# Patient Record
Sex: Male | Born: 1950 | Race: White | Hispanic: No | Smoking: Current some day smoker
Health system: Southern US, Community
[De-identification: ages and names within clinical notes are randomized; demographics above are authoritative.]

## PROBLEM LIST (undated history)

## (undated) DIAGNOSIS — K7460 Unspecified cirrhosis of liver: Secondary | ICD-10-CM

## (undated) DIAGNOSIS — J449 Chronic obstructive pulmonary disease, unspecified: Secondary | ICD-10-CM

## (undated) HISTORY — PX: NO PAST SURGERIES: SHX2092

## (undated) HISTORY — PX: MOUTH SURGERY: SHX715

---

## 2017-02-16 DIAGNOSIS — J449 Chronic obstructive pulmonary disease, unspecified: Secondary | ICD-10-CM | POA: Diagnosis present

## 2017-02-16 DIAGNOSIS — F1010 Alcohol abuse, uncomplicated: Secondary | ICD-10-CM | POA: Insufficient documentation

## 2017-02-16 DIAGNOSIS — R531 Weakness: Secondary | ICD-10-CM

## 2021-07-23 ENCOUNTER — Emergency Department: Payer: Medicare (Managed Care)

## 2021-07-23 ENCOUNTER — Inpatient Hospital Stay
Admission: EM | Admit: 2021-07-23 | Discharge: 2021-07-24 | DRG: 177 | Discharge status: Against Medical Advice | Payer: Medicare (Managed Care) | Attending: Internal Medicine | Admitting: Internal Medicine

## 2021-07-23 ENCOUNTER — Other Ambulatory Visit: Payer: Self-pay

## 2021-07-23 ENCOUNTER — Inpatient Hospital Stay: Payer: Self-pay

## 2021-07-23 ENCOUNTER — Encounter: Payer: Self-pay | Admitting: Emergency Medicine

## 2021-07-23 DIAGNOSIS — J1282 Pneumonia due to coronavirus disease 2019: Secondary | ICD-10-CM | POA: Diagnosis present

## 2021-07-23 DIAGNOSIS — I10 Essential (primary) hypertension: Secondary | ICD-10-CM | POA: Diagnosis present

## 2021-07-23 DIAGNOSIS — L03115 Cellulitis of right lower limb: Secondary | ICD-10-CM | POA: Diagnosis present

## 2021-07-23 DIAGNOSIS — Z5900 Homelessness unspecified: Secondary | ICD-10-CM | POA: Diagnosis not present

## 2021-07-23 DIAGNOSIS — D509 Iron deficiency anemia, unspecified: Secondary | ICD-10-CM | POA: Diagnosis not present

## 2021-07-23 DIAGNOSIS — J90 Pleural effusion, not elsewhere classified: Secondary | ICD-10-CM | POA: Diagnosis present

## 2021-07-23 DIAGNOSIS — I8510 Secondary esophageal varices without bleeding: Secondary | ICD-10-CM | POA: Diagnosis present

## 2021-07-23 DIAGNOSIS — R188 Other ascites: Secondary | ICD-10-CM | POA: Diagnosis not present

## 2021-07-23 DIAGNOSIS — R0609 Other forms of dyspnea: Secondary | ICD-10-CM | POA: Diagnosis not present

## 2021-07-23 DIAGNOSIS — K922 Gastrointestinal hemorrhage, unspecified: Secondary | ICD-10-CM | POA: Diagnosis present

## 2021-07-23 DIAGNOSIS — D649 Anemia, unspecified: Secondary | ICD-10-CM | POA: Diagnosis not present

## 2021-07-23 DIAGNOSIS — U071 COVID-19: Secondary | ICD-10-CM | POA: Diagnosis present

## 2021-07-23 DIAGNOSIS — E876 Hypokalemia: Secondary | ICD-10-CM | POA: Diagnosis present

## 2021-07-23 DIAGNOSIS — K7460 Unspecified cirrhosis of liver: Secondary | ICD-10-CM | POA: Diagnosis not present

## 2021-07-23 DIAGNOSIS — R600 Localized edema: Secondary | ICD-10-CM | POA: Diagnosis present

## 2021-07-23 DIAGNOSIS — K766 Portal hypertension: Secondary | ICD-10-CM | POA: Diagnosis present

## 2021-07-23 DIAGNOSIS — F17210 Nicotine dependence, cigarettes, uncomplicated: Secondary | ICD-10-CM | POA: Diagnosis present

## 2021-07-23 DIAGNOSIS — R0602 Shortness of breath: Secondary | ICD-10-CM | POA: Diagnosis present

## 2021-07-23 DIAGNOSIS — K7031 Alcoholic cirrhosis of liver with ascites: Secondary | ICD-10-CM | POA: Diagnosis present

## 2021-07-23 HISTORY — DX: Chronic obstructive pulmonary disease, unspecified: J44.9

## 2021-07-23 HISTORY — DX: Unspecified cirrhosis of liver: K74.60

## 2021-07-23 LAB — HEPATIC FUNCTION PANEL
ALT: 21 U/L (ref 0–44)
AST: 50 U/L — ABNORMAL HIGH (ref 15–41)
Albumin: 2.5 g/dL — ABNORMAL LOW (ref 3.5–5.0)
Alkaline Phosphatase: 122 U/L (ref 38–126)
Bilirubin, Direct: 1.6 mg/dL — ABNORMAL HIGH (ref 0.0–0.2)
Indirect Bilirubin: 3.4 mg/dL — ABNORMAL HIGH (ref 0.3–0.9)
Total Bilirubin: 5 mg/dL — ABNORMAL HIGH (ref 0.3–1.2)
Total Protein: 5.2 g/dL — ABNORMAL LOW (ref 6.5–8.1)

## 2021-07-23 LAB — BASIC METABOLIC PANEL
Anion gap: 8 (ref 5–15)
BUN: 12 mg/dL (ref 8–23)
CO2: 21 mmol/L — ABNORMAL LOW (ref 22–32)
Calcium: 8.1 mg/dL — ABNORMAL LOW (ref 8.9–10.3)
Chloride: 107 mmol/L (ref 98–111)
Creatinine, Ser: 1 mg/dL (ref 0.61–1.24)
GFR, Estimated: >60 mL/min (ref >60)
Glucose, Bld: 100 mg/dL — ABNORMAL HIGH (ref 70–99)
Potassium: 3.1 mmol/L — ABNORMAL LOW (ref 3.5–5.1)
Sodium: 136 mmol/L (ref 135–145)

## 2021-07-23 LAB — RESP PANEL BY RT-PCR (FLU A&B, COVID) ARPGX2
Influenza A by PCR: NEGATIVE
Influenza B by PCR: NEGATIVE
SARS Coronavirus 2 by RT PCR: POSITIVE — AB

## 2021-07-23 LAB — CBC
HCT: 22.1 % — ABNORMAL LOW (ref 39.0–52.0)
Hemoglobin: 6.9 g/dL — ABNORMAL LOW (ref 13.0–17.0)
MCH: 26.7 pg (ref 26.0–34.0)
MCHC: 31.2 g/dL (ref 30.0–36.0)
MCV: 85.7 fL (ref 80.0–100.0)
Platelets: 44 10*3/uL — ABNORMAL LOW (ref 150–400)
RBC: 2.58 MIL/uL — ABNORMAL LOW (ref 4.22–5.81)
RDW: 24.9 % — ABNORMAL HIGH (ref 11.5–15.5)
WBC: 2.3 10*3/uL — ABNORMAL LOW (ref 4.0–10.5)
nRBC: 0 % (ref 0.0–0.2)

## 2021-07-23 LAB — IRON AND TIBC
Iron: 30 ug/dL — ABNORMAL LOW (ref 45–182)
Saturation Ratios: 8 % — ABNORMAL LOW (ref 17.9–39.5)
TIBC: 360 ug/dL (ref 250–450)
UIBC: 330 ug/dL

## 2021-07-23 LAB — RETICULOCYTES
Immature Retic Fract: 26.4 % — ABNORMAL HIGH (ref 2.3–15.9)
RBC.: 2.54 MIL/uL — ABNORMAL LOW (ref 4.22–5.81)
Retic Count, Absolute: 73.2 10*3/uL (ref 19.0–186.0)
Retic Ct Pct: 2.9 % (ref 0.4–3.1)

## 2021-07-23 LAB — PREPARE RBC (CROSSMATCH)

## 2021-07-23 LAB — ALBUMIN: Albumin: 2.5 g/dL — ABNORMAL LOW (ref 3.5–5.0)

## 2021-07-23 LAB — ABO/RH: ABO/RH(D): O POS

## 2021-07-23 LAB — BRAIN NATRIURETIC PEPTIDE: B Natriuretic Peptide: 278.9 pg/mL — ABNORMAL HIGH (ref 0.0–100.0)

## 2021-07-23 LAB — FERRITIN: Ferritin: 29 ng/mL (ref 24–336)

## 2021-07-23 MED ORDER — SODIUM CHLORIDE 0.9 % IV SOLN
50.0000 ug/h | INTRAVENOUS | Status: DC
  Administered 2021-07-23 – 2021-07-24 (×2): 50 ug/h via INTRAVENOUS
  Filled 2021-07-23 (×5): qty 1

## 2021-07-23 MED ORDER — IOHEXOL 300 MG/ML  SOLN
75.0000 mL | Freq: Once | INTRAMUSCULAR | Status: AC | PRN
  Administered 2021-07-23: 75 mL via INTRAVENOUS

## 2021-07-23 MED ORDER — FAMOTIDINE IN NACL 20-0.9 MG/50ML-% IV SOLN
20.0000 mg | Freq: Once | INTRAVENOUS | Status: AC
  Administered 2021-07-23: 20 mg via INTRAVENOUS
  Filled 2021-07-23: qty 50

## 2021-07-23 MED ORDER — SODIUM CHLORIDE 0.9 % IV SOLN
100.0000 mg | Freq: Every day | INTRAVENOUS | Status: DC
Start: 2021-07-24 — End: 2021-07-24
  Administered 2021-07-24: 100 mg via INTRAVENOUS
  Filled 2021-07-23 (×2): qty 20

## 2021-07-23 MED ORDER — MORPHINE SULFATE (PF) 2 MG/ML IV SOLN: 2.0000 mg | INTRAVENOUS | Status: DC | PRN

## 2021-07-23 MED ORDER — SODIUM CHLORIDE 0.9 % IV SOLN
200.0000 mg | Freq: Once | INTRAVENOUS | Status: AC
  Administered 2021-07-23: 200 mg via INTRAVENOUS
  Filled 2021-07-23: qty 200

## 2021-07-23 MED ORDER — SODIUM CHLORIDE 0.9 % IV SOLN
1.0000 g | INTRAVENOUS | Status: DC
  Administered 2021-07-23: 1 g via INTRAVENOUS
  Filled 2021-07-23: qty 10

## 2021-07-23 MED ORDER — ACETAMINOPHEN 325 MG PO TABS: 650.0000 mg | ORAL_TABLET | Freq: Four times a day (QID) | ORAL | Status: DC | PRN

## 2021-07-23 MED ORDER — SODIUM CHLORIDE 0.9 % IV BOLUS
500.0000 mL | Freq: Once | INTRAVENOUS | Status: AC
Start: 2021-07-23 — End: 2021-07-23
  Administered 2021-07-23: 500 mL via INTRAVENOUS

## 2021-07-23 MED ORDER — POLYETHYLENE GLYCOL 3350 17 G PO PACK: 17.0000 g | PACK | Freq: Every day | ORAL | Status: DC | PRN

## 2021-07-23 MED ORDER — SODIUM CHLORIDE 0.9% IV SOLUTION
Freq: Once | INTRAVENOUS | Status: AC
Start: 2021-07-23 — End: 2021-07-24
  Filled 2021-07-23: qty 250

## 2021-07-23 MED ORDER — SODIUM CHLORIDE 0.9 % IV SOLN
10.0000 mL/h | Freq: Once | INTRAVENOUS | Status: AC
  Administered 2021-07-23: 10 mL/h via INTRAVENOUS

## 2021-07-23 MED ORDER — ONDANSETRON HCL 4 MG PO TABS: 4.0000 mg | ORAL_TABLET | Freq: Four times a day (QID) | ORAL | Status: DC | PRN

## 2021-07-23 MED ORDER — POTASSIUM CHLORIDE 10 MEQ/100ML IV SOLN: 10.0000 meq | INTRAVENOUS | Status: DC

## 2021-07-23 MED ORDER — PANTOPRAZOLE 80MG IVPB - SIMPLE MED
80.0000 mg | Freq: Once | INTRAVENOUS | Status: AC
  Administered 2021-07-23: 80 mg via INTRAVENOUS
  Filled 2021-07-23: qty 80

## 2021-07-23 MED ORDER — ACETAMINOPHEN 650 MG RE SUPP: 650.0000 mg | Freq: Four times a day (QID) | RECTAL | Status: DC | PRN

## 2021-07-23 MED ORDER — PANTOPRAZOLE SODIUM 40 MG IV SOLR: 40.0000 mg | Freq: Two times a day (BID) | INTRAVENOUS | Status: DC

## 2021-07-23 MED ORDER — POTASSIUM CHLORIDE CRYS ER 20 MEQ PO TBCR
40.0000 meq | EXTENDED_RELEASE_TABLET | Freq: Two times a day (BID) | ORAL | Status: AC
  Administered 2021-07-23 – 2021-07-24 (×2): 40 meq via ORAL
  Filled 2021-07-23 (×2): qty 2

## 2021-07-23 MED ORDER — PANTOPRAZOLE INFUSION (NEW) - SIMPLE MED
8.0000 mg/h | INTRAVENOUS | Status: DC
  Administered 2021-07-23 – 2021-07-24 (×2): 8 mg/h via INTRAVENOUS
  Filled 2021-07-23 (×2): qty 80

## 2021-07-23 MED ORDER — ONDANSETRON HCL 4 MG/2ML IJ SOLN: 4.0000 mg | Freq: Four times a day (QID) | INTRAMUSCULAR | Status: DC | PRN

## 2021-07-23 MED ORDER — OXYCODONE HCL 5 MG PO TABS: 5.0000 mg | ORAL_TABLET | ORAL | Status: DC | PRN

## 2021-07-23 MED ORDER — OCTREOTIDE LOAD VIA INFUSION
50.0000 ug | Freq: Once | INTRAVENOUS | Status: AC
  Administered 2021-07-23: 50 ug via INTRAVENOUS
  Filled 2021-07-23: qty 25

## 2021-07-23 NOTE — ED Triage Notes (Signed)
Pt called from [facility] to treatment room, no response. No phone number listed for pt in system

## 2021-07-23 NOTE — ED Provider Notes (Signed)
[facility] Emergency Department Provider Note ____________________________________________   Event Date/Time   First MD Initiated Contact with Patient 07/23/21 1237     (approximate)  I have reviewed the triage vital signs and the nursing notes.   HISTORY  Chief Complaint Shortness of Breath  HPI Johnny Dunlap is a 71 y.o. male with history of COPD presents to the emergency department for treatment and evaluation of shortness of breath, bilateral lower extremity edema.  Symptoms have been present for the past several weeks.  Patient states that he has been traveling on the train off and on for the past 3 months or so and attempt to find some family in different parts of the state.  Patient also complains of feeling weak and dizzy.      Past Medical History:  Diagnosis Date   Cirrhosis (HCC)    COPD (chronic obstructive pulmonary disease) (HCC)     Patient Active Problem List   Diagnosis Date Noted   GI bleed 07/23/2021    Past Surgical History:  Procedure Laterality Date   MOUTH SURGERY      Prior to Admission medications   Not on File    Allergies Patient has no known allergies.  History reviewed. No pertinent family history.  Social History Social History   Tobacco Use   Smoking status: Every Day    Types: Cigarettes   Smokeless tobacco: Never  Substance Use Topics   Alcohol use: Not Currently    Review of Systems  Constitutional: No fever/chills Eyes: No visual changes. ENT: No sore throat. Cardiovascular: Denies chest pain. Respiratory: Positive for shortness of breath. Gastrointestinal: No abdominal pain.  No nausea, no vomiting.  No diarrhea.  No constipation. Genitourinary: Negative for dysuria.  Positive for dark urine Musculoskeletal: Positive for diffuse body aches Skin: Negative for rash. Neurological: Negative for headaches, focal weakness or numbness.  ____________________________________________   PHYSICAL  EXAM:  VITAL SIGNS: ED Triage Vitals  Enc Vitals Group     BP 07/23/21 0954 95/61     Pulse Rate 07/23/21 0954 84     Resp 07/23/21 0954 (!) 22     Temp 07/23/21 0954 99.2 F (37.3 C)     Temp Source 07/23/21 0954 Oral     SpO2 07/23/21 0954 98 %     Weight 07/23/21 0947 145 lb (65.8 kg)     Height 07/23/21 0947 6' (1.829 m)     Head Circumference --      Peak Flow --      Pain Score 07/23/21 0947 8     Pain Loc --      Pain Edu? --      Excl. in GC? --     Constitutional: Alert and oriented.  Chronically ill appearing and in no acute distress. Eyes: Conjunctivae are normal.  Head: Atraumatic. Nose: No congestion/rhinnorhea. Mouth/Throat: Mucous membranes are moist.  Oropharynx non-erythematous. Neck: No stridor.   Hematological/Lymphatic/Immunilogical: No cervical lymphadenopathy. Cardiovascular: Normal rate, regular rhythm. Grossly normal heart sounds.  Good peripheral circulation.  Bilateral 2+ pitting edema lower extremities Respiratory: Normal respiratory effort.  No retractions. Lungs CTAB. Gastrointestinal: Soft and nontender. No distention. No abdominal bruits. Genitourinary:  Musculoskeletal: Positive for lower extremity tenderness and pitting edema.  Neurologic:  Normal speech and language. No gross focal neurologic deficits are appreciated. No gait instability. Skin: Weeping bilateral lower extremities without indication of cellulitis Psychiatric: Mood and affect are normal.  Tearful.  Speech and behavior are normal.  ____________________________________________  LABS (all labs ordered are listed, but only abnormal results are displayed)  Labs Reviewed  RESP PANEL BY RT-PCR (FLU A&B, COVID) ARPGX2 - Abnormal; Notable for the following components:      Result Value   SARS Coronavirus 2 by RT PCR POSITIVE (*)    All other components within normal limits  BASIC METABOLIC PANEL - Abnormal; Notable for the following components:   Potassium 3.1 (*)    CO2 21 (*)     Glucose, Bld 100 (*)    Calcium 8.1 (*)    All other components within normal limits  CBC - Abnormal; Notable for the following components:   WBC 2.3 (*)    RBC 2.58 (*)    Hemoglobin 6.9 (*)    HCT 22.1 (*)    RDW 24.9 (*)    Platelets 44 (*)    All other components within normal limits  BRAIN NATRIURETIC PEPTIDE - Abnormal; Notable for the following components:   B Natriuretic Peptide 278.9 (*)    All other components within normal limits  RETICULOCYTES - Abnormal; Notable for the following components:   RBC. 2.54 (*)    Immature Retic Fract 26.4 (*)    All other components within normal limits  ALBUMIN - Abnormal; Notable for the following components:   Albumin 2.5 (*)    All other components within normal limits  FERRITIN  HEPATIC FUNCTION PANEL  IRON AND TIBC  PATHOLOGIST SMEAR REVIEW  COMPREHENSIVE METABOLIC PANEL  CBC  PROTIME-INR  APTT  HIV ANTIBODY (ROUTINE TESTING W REFLEX)  TYPE AND SCREEN  PREPARE RBC (CROSSMATCH)  ABO/RH   ____________________________________________  EKG  ED ECG REPORT I, Lasundra Hascall, FNP-BC personally viewed and interpreted this ECG.   Date: 07/23/2021  EKG Time: 0951  Rate: 85  Rhythm: atrial fibrillation, rate controlled  Axis: normal  Intervals:none  ST&T Change: no ST elevation No previous available for comparison.  ____________________________________________  RADIOLOGY  ED MD interpretation:    Chest xray shows rounded opacity along the right inferior paramediastinal border. Radiology recommends follow up with CT.  I, Kem Boroughs, personally viewed and evaluated these images (plain radiographs) as part of my medical decision making, as well as reviewing the written report by the radiologist.  Official radiology report(s): DG Chest 2 View  Result Date: 07/23/2021 CLINICAL DATA:  shortness of breath EXAM: CHEST - 2 VIEW COMPARISON:  None. FINDINGS: The cardiomediastinal silhouette is mildly enlarged in contour.  Small RIGHT pleural effusion. No pneumothorax. Flattening of the diaphragms and diffuse reticular opacities are consistent with history of underlying emphysema. Rounded opacity along the RIGHT paramediastinal border inferiorly. Visualized abdomen is unremarkable. No acute osseous abnormality. IMPRESSION: 1. Rounded opacity along the RIGHT inferior paramediastinal border is nonspecific. Given underlying emphysema, recommend further evaluation with dedicated chest CT. Electronically Signed   By: Meda Klinefelter MD   On: 07/23/2021 14:06   CT Chest W Contrast  Result Date: 07/23/2021 CLINICAL DATA:  Abnormal xray - lung opacity/opacities EXAM: CT CHEST WITH CONTRAST TECHNIQUE: Multidetector CT imaging of the chest was performed during intravenous contrast administration. CONTRAST:  48mL OMNIPAQUE IOHEXOL 300 MG/ML  SOLN COMPARISON:  Same day radiograph FINDINGS: Cardiovascular: Heart is mildly enlarged. Three-vessel coronary artery atherosclerotic calcifications. No significant pericardial effusion. Dense aortic valve calcifications. LEFT vertebral artery arises from the aortic arch. Atherosclerotic calcifications of the aorta. Mediastinum/Nodes: Thyroid is unremarkable. No axillary adenopathy. Mildly prominent mediastinal lymph nodes with representative pretracheal lymph node measuring 9 mm in the short  axis (series 2, image 68). Lungs/Pleura: Moderate centrilobular and paraseptal emphysema. Biapical irregular opacities most consistent with scar. There is a moderate RIGHT pleural effusion. There is a small LEFT pleural effusion. Bibasilar enhancing platelike opacities. There is a suggestion of a more focal irregular nodule which measures 7 x 7 mm (series 2, image 71). Upper Abdomen: Nodular contour of the liver. Recanalized paraumbilical vein. Splenomegaly. Esophageal varices. Moderate hiatal hernia. Small volume ascites. Nonobstructive RIGHT-sided nephrolithiasis. Musculoskeletal: No fracture is seen.  IMPRESSION: 1. Moderate RIGHT and small LEFT pleural effusion with likely bibasilar atelectasis. Given underlying emphysema, recommend follow-up CT in 3-6 months to assess for resolution of more focal possible nodular opacities. 2. Given emphysema, recommend evaluation for candidacy for annual lung cancer screening. 3. Cirrhosis with sequela of portal venous hypertension including recanalization of the umbilical vein, splenomegaly, ascites and esophageal varices. 4. Questioned nodular opacity corresponds to summation of a distended inferior cavoatrial junction, hiatal hernia, esophageal varices and pleural effusion. Aortic Atherosclerosis (ICD10-I70.0) and Emphysema (ICD10-J43.9). Electronically Signed   By: Meda KlinefelterStephanie  Peacock MD   On: 07/23/2021 16:38   US EKG SITE RITE  Result Date: 07/23/2021 If Site Rite image not attached, placement could not be confirmed due to current cardiac rhythm.   ____________________________________________   PROCEDURES  Procedure(s) performed (including Critical Care):  Procedures  ____________________________________________   INITIAL IMPRESSION / ASSESSMENT AND PLAN     71 year old male presenting to the emergency department for treatment and evaluation of symptoms as described in the HPI.  CBC shows pancytopenia with a hemoglobin of 6.9 hematocrit of 22.1 and platelet count of 44.  There are no outside records available for comparison of labs, imaging, or EKG.  EKG does show controlled A. fib.  DIFFERENTIAL DIAGNOSIS  Symptomatic anemia, acute on chronic anemia, COPD exacerbation, CHF, COVID-19  ED COURSE  Hemoccult is positive.  Anemia discussed with the patient who is lying in the bed covered with 2 blankets and a heavy winter coat.  He states that he cannot seem to get warm.  He states that this has been progressively worsening over the past couple of weeks.  He has not noticed any dark or tarry stools but states that he has not been paying much  attention as he has been traveling significant number of days.  Discussed blood transfusion.  Patient is agreeable and will consent.  He also agrees to admission.  Awaiting CT of the chest to determine if antibiotics are indicated for the opacity noted in the right lung.  No indication of sepsis at this time.  CT of the chest without concern of infectious process. Patient is COVID positive. Blood infusing at this time. Will request Hospitalist consult for admission.  CRITICAL CARE Performed by: Kem Boroughsari Nargis Abrams   Total critical care time: 45 minutes  Critical care time was exclusive of separately billable procedures and treating other patients.  Critical care was necessary to treat or prevent imminent or life-threatening deterioration.  Critical care was time spent personally by me on the following activities: development of treatment plan with patient and/or surrogate as well as nursing, discussions with consultants, evaluation of patient's response to treatment, examination of patient, obtaining history from patient or surrogate, ordering and performing treatments and interventions, ordering and review of laboratory studies, ordering and review of radiographic studies, pulse oximetry and re-evaluation of patient's condition.     Clinical Course as of 07/23/21 1956  Sun Jul 23, 2021  1736 Accepted for admission. [CT]    Clinical Course  User Index [CT] Chinita Pester, FNP   ___________________________________________   FINAL CLINICAL IMPRESSION(S) / ED DIAGNOSES  Final diagnoses:  Symptomatic anemia  Gastrointestinal hemorrhage, unspecified gastrointestinal hemorrhage type  COVID     ED Discharge Orders     None        Johnny Dunlap was evaluated in Emergency Department on 07/23/2021 for the symptoms described in the history of present illness. He was evaluated in the context of the global COVID-19 pandemic, which necessitated consideration that the patient might be at risk  for infection with the SARS-CoV-2 virus that causes COVID-19. Institutional protocols and algorithms that pertain to the evaluation of patients at risk for COVID-19 are in a state of rapid change based on information released by regulatory bodies including the CDC and federal and state organizations. These policies and algorithms were followed during the patient's care in the ED.   Note:  This document was prepared using Dragon voice recognition software and may include unintentional dictation errors.    Chinita Pester, FNP 07/23/21 1956    Gilles Chiquito, MD 07/23/21 2030

## 2021-07-23 NOTE — Progress Notes (Signed)
Pharmacy Consult - Remdesivir  71 yo male presenting COVID-19 positive with respiratory symptoms requiring hospitalization. Pharmacy consulted to dose Remdesivir. ALT<220.  Plan: Remdesivir 200mg  IV x 1; then 100mg  IV q24h to complete 5 total doses Monitor clinical progress  , PharmD Clinical Pharmacist  07/23/2021   5:48 PM

## 2021-07-23 NOTE — ED Notes (Signed)
Partial infiltration of the Remdesivir into the left upper arm, called the Pharmacy for treatment [warm compresses and elevation of extremity, notified the Hospitalist of the infiltration and treatment, also informed the Hospitalist of the delay of Octreotde due to poor vasculat access.   Patient is ordered a Picc Line, but the Picc Line can not be done until 7/25

## 2021-07-23 NOTE — ED Triage Notes (Signed)
Pt called from [facility] to treatment room, no response 

## 2021-07-23 NOTE — ED Triage Notes (Signed)
Pt [place] the ED [place] [facility] [place] [facility]/o [facility].  98% RA with good air movement per EMS.  H/o COPD, but does not wear O2 at baseline.  EKG unremarkable.  102 systolic for BP.  Pt does admit to some bilateral leg swelling.

## 2021-07-23 NOTE — H&P (Signed)
History and Physical    Johnny Dunlap GNO:[id number] DOB: Mar 29, 1950 DOA: 07/23/2021  PCP: Pcp, No  Chief Complaint: Shortness of breath  HPI: Johnny Dunlap is a 71 y.o. male with a past medical history of COPD not on oxygen supplementation, cirrhosis, minimal tobacco use approximately 3 cigarettes/day, former alcohol use.  The patient presented to the ED via EMS from the train station. The patient presented to the emergency department due to shortness of breath and bilateral lower extremity edema.  Symptoms have been going on for the past several weeks.  The patient stated that he has been traveling on the train off and on for the past 3 months or so and is attempting to find some family in different parts of the state.  He is homeless.  Associated symptoms include generalized weakness and dizziness.  He states he has not noticed any melena or hematochezia.  But states he has not been paying much attention to his stools.  Upon my evaluation he is currently getting his first unit of packed red blood cells.  Covered in blankets and a jacket and states he feels cold.  He is not shivering or having rigors.  States he has been COVID vaccinated.  Somewhat of a poor historian.  Adamantly tells me that he does not drink alcohol anymore. He denies any chest pain.  Denies any nausea, vomiting, diarrhea.  He denies any abdominal pain.  He denies any hematemesis.  No known history of CHF.  He denies any use of NSAIDs.  ED Course: Type and screen, BMP, CBC, reticulocyte, COVID-19/influenza/RSV PCR panel.  Pepcid 20 mg IV x1.  Normal saline 500 cc bolus x1.  Review of Systems: 14 point review of systems is negative except for what is mentioned above in the HPI.   Past Medical History:  Diagnosis Date   Cirrhosis (HCC)    COPD (chronic obstructive pulmonary disease) (HCC)     Past Surgical History:  Procedure Laterality Date   MOUTH SURGERY      Social History   Socioeconomic History   Marital status:  Legally Separated    Spouse name: Not on file   Number of children: Not on file   Years of education: Not on file   Highest education level: Not on file  Occupational History   Not on file  Tobacco Use   Smoking status: Every Day    Types: Cigarettes   Smokeless tobacco: Never  Substance and Sexual Activity   Alcohol use: Not Currently   Drug use: Not on file   Sexual activity: Not on file  Other Topics Concern   Not on file  Social History Narrative   Not on file   Social Determinants of Health   Financial Resource Strain: Not on file  Food Insecurity: Not on file  Transportation Needs: Not on file  Physical Activity: Not on file  Stress: Not on file  Social Connections: Not on file  Intimate Partner Violence: Not on file    No Known Allergies  History reviewed. No pertinent family history.  Prior to Admission medications   Not on File    Physical Exam: Vitals:   07/23/21 1642 07/23/21 1700 07/23/21 1800 07/23/21 1854  BP: (!) 95/55 (!) 97/54 (!) 97/54 (!) 95/59  Pulse: 64 72 60 60  Resp: (!) 21 20 18 19   Temp: 98.2 F (36.8 C)  98.1 F (36.7 C) 98.2 F (36.8 C)  TempSrc: Oral  Oral Oral  SpO2: 94% 95% 95% 95%  Weight:      Height:         General: Jaundiced appearing, alert and blankets and jacket but is disheveled Cardiovascular: Irregular rhythm with regular rate Respiratory:   CTA bilaterally with no wheezes/rales/rhonchi.  Normal respiratory effort. Abdomen:  soft, NT, ND, NABS Skin:  no rash or induration seen on limited exam Musculoskeletal:  grossly normal tone BUE/BLE, good ROM, no bony abnormality Lower extremity:  No 2+ pitting edema bilateral lower extremities with weeping to the knees.  Right anterior shin area with erythema and some warmth consistent with mild cellulitis. Limited foot exam with no ulcerations.  2+ distal pulses. Psychiatric:  grossly normal mood and affect, speech fluent and appropriate, AOx3 Neurologic:  CN 2-12 grossly  intact, moves all extremities in coordinated fashion, sensation intact    Radiological Exams on Admission: Independently reviewed - see discussion in A/P where applicable  DG Chest 2 View  Result Date: 07/23/2021 CLINICAL DATA:  shortness of breath EXAM: CHEST - 2 VIEW COMPARISON:  None. FINDINGS: The cardiomediastinal silhouette is mildly enlarged in contour. Small RIGHT pleural effusion. No pneumothorax. Flattening of the diaphragms and diffuse reticular opacities are consistent with history of underlying emphysema. Rounded opacity along the RIGHT paramediastinal border inferiorly. Visualized abdomen is unremarkable. No acute osseous abnormality. IMPRESSION: 1. Rounded opacity along the RIGHT inferior paramediastinal border is nonspecific. Given underlying emphysema, recommend further evaluation with dedicated chest CT. Electronically Signed   By: Meda Klinefelter MD   On: 07/23/2021 14:06   CT Chest W Contrast  Result Date: 07/23/2021 CLINICAL DATA:  Abnormal xray - lung opacity/opacities EXAM: CT CHEST WITH CONTRAST TECHNIQUE: Multidetector CT imaging of the chest was performed during intravenous contrast administration. CONTRAST:  31mL OMNIPAQUE IOHEXOL 300 MG/ML  SOLN COMPARISON:  Same day radiograph FINDINGS: Cardiovascular: Heart is mildly enlarged. Three-vessel coronary artery atherosclerotic calcifications. No significant pericardial effusion. Dense aortic valve calcifications. LEFT vertebral artery arises from the aortic arch. Atherosclerotic calcifications of the aorta. Mediastinum/Nodes: Thyroid is unremarkable. No axillary adenopathy. Mildly prominent mediastinal lymph nodes with representative pretracheal lymph node measuring 9 mm in the short axis (series 2, image 68). Lungs/Pleura: Moderate centrilobular and paraseptal emphysema. Biapical irregular opacities most consistent with scar. There is a moderate RIGHT pleural effusion. There is a small LEFT pleural effusion. Bibasilar  enhancing platelike opacities. There is a suggestion of a more focal irregular nodule which measures 7 x 7 mm (series 2, image 71). Upper Abdomen: Nodular contour of the liver. Recanalized paraumbilical vein. Splenomegaly. Esophageal varices. Moderate hiatal hernia. Small volume ascites. Nonobstructive RIGHT-sided nephrolithiasis. Musculoskeletal: No fracture is seen. IMPRESSION: 1. Moderate RIGHT and small LEFT pleural effusion with likely bibasilar atelectasis. Given underlying emphysema, recommend follow-up CT in 3-6 months to assess for resolution of more focal possible nodular opacities. 2. Given emphysema, recommend evaluation for candidacy for annual lung cancer screening. 3. Cirrhosis with sequela of portal venous hypertension including recanalization of the umbilical vein, splenomegaly, ascites and esophageal varices. 4. Questioned nodular opacity corresponds to summation of a distended inferior cavoatrial junction, hiatal hernia, esophageal varices and pleural effusion. Aortic Atherosclerosis (ICD10-I70.0) and Emphysema (ICD10-J43.9). Electronically Signed   By: Meda Klinefelter MD   On: 07/23/2021 16:38   [place] EKG SITE RITE  Result Date: 07/23/2021 If Site Rite image not attached, placement could not be confirmed due to current cardiac rhythm.   EKG: Independently reviewed.  Atrial fib with rate 85  Labs on Admission: I have personally reviewed the available labs and imaging  studies at the time of the admission.  Pertinent labs: BNP 278, WBC 2.3, hemoglobin 6.9, hematocrit 22.1, platelets 44, potassium 3.1, bicarb 21, blood glucose 100, calcium 8.1.  COVID-19 PCR positive.  Hemoccult positive with brown stool.     Assessment/Plan: COVID-19 infection: The patient will be admitted to the progressive cardiac floor under inpatient status with cardiac monitoring.  No infiltrate or consolidation seen on CT chest with contrast.  There is moderate right and small left pleural effusion noted.  He  is not acutely hypoxic.  Can consider thoracentesis if he becomes symptomatic.  Start remdesivir.  He states he is vaccinated.  No indication for steroids at the moment.  The patient is getting multiple IV infusions therefore we will order a PICC line.  Acute GI bleed: Blood pressures are near hypotension with systolics in the 90s.  He is acutely anemic with a hemoglobin of 6.9.  Type and screen done.  Currently getting first unit of packed red blood cells now.  I have ordered a second unit as well. Hemoccult was positive.  Brown stool was noted.  Unclear etiology.  CT imaging notes cirrhosis with sequela of portal-venous hypertension and esophageal varices.  Gastroenterology will be consulted.  He denies any hematemesis.  Start Protonix bolus and subsequently started on a Protonix drip.  Discussed with GI and they recommended starting on octreotide drip and ceftriaxone.  N.p.o. after midnight.  We will obtain iron studies.  Pancytopenia: Pathologist smear review pending.  Hypokalemia: Initial serum potassium 3.1.  Will replete with oral potassium supplementation.  Bilateral pleural effusions and bilateral peripheral edema: BNP elevated at 278.  We will obtain an echocardiogram.  We will hold off on any IV diuresis for now as he is not acutely hypoxic and blood pressures are soft.  Mild cellulitis: Nonpurulent right lower extremity cellulitis.  Started on ceftriaxone as noted above.  Tobacco use: Counseled on tobacco cessation.  COPD: Not in acute exacerbation.  Cirrhosis: Does not appear to be in acute decompensation.  Continue monitoring. Will obtain hepatitis serologies.  Level of Care: Cardiac progressive DVT prophylaxis: SCDs Code Status: Full code Consults: Gastroenterology Admission status: Inpatient   Verdia Kuba DO [facility]   How to contact the [facility] Attending or Consulting provider 7A - 7P or covering provider during after hours 7P -7A, for this patient?  Check the  care team in [facility] and look for a) attending/consulting [facility] provider listed and b) the [facility] team listed Log into [email or web address] and use [facility] universal password to access. If you do not have the password, please contact the hospital operator. Locate the [facility] provider you are looking for under [facility] and page to a number that you can be directly reached. If you still have difficulty reaching the provider, please page the [facility] (Director on Call) for the Hospitalists listed on amion for assistance.   07/23/2021, 7:42 PM

## 2021-07-23 NOTE — Progress Notes (Signed)
Secure chat sent to [id number] [facility] re PICC order.  PICC will not be placed until Monday.

## 2021-07-24 ENCOUNTER — Other Ambulatory Visit: Payer: Self-pay | Admitting: Family Medicine

## 2021-07-24 ENCOUNTER — Inpatient Hospital Stay (HOSPITAL_COMMUNITY)
Admit: 2021-07-24 | Discharge: 2021-07-24 | Disposition: A | Discharge status: Home / Self Care | Payer: Medicare (Managed Care) | Attending: Family Medicine | Admitting: Family Medicine

## 2021-07-24 DIAGNOSIS — U071 COVID-19: Secondary | ICD-10-CM | POA: Diagnosis not present

## 2021-07-24 DIAGNOSIS — K7460 Unspecified cirrhosis of liver: Secondary | ICD-10-CM | POA: Diagnosis not present

## 2021-07-24 DIAGNOSIS — R0609 Other forms of dyspnea: Secondary | ICD-10-CM | POA: Diagnosis not present

## 2021-07-24 DIAGNOSIS — D509 Iron deficiency anemia, unspecified: Secondary | ICD-10-CM | POA: Diagnosis not present

## 2021-07-24 DIAGNOSIS — D649 Anemia, unspecified: Secondary | ICD-10-CM

## 2021-07-24 DIAGNOSIS — R188 Other ascites: Secondary | ICD-10-CM

## 2021-07-24 LAB — COMPREHENSIVE METABOLIC PANEL
ALT: 24 U/L (ref 0–44)
AST: 55 U/L — ABNORMAL HIGH (ref 15–41)
Albumin: 2.2 g/dL — ABNORMAL LOW (ref 3.5–5.0)
Alkaline Phosphatase: 102 U/L (ref 38–126)
Anion gap: 7 (ref 5–15)
BUN: 10 mg/dL (ref 8–23)
CO2: 19 mmol/L — ABNORMAL LOW (ref 22–32)
Calcium: 7.7 mg/dL — ABNORMAL LOW (ref 8.9–10.3)
Chloride: 110 mmol/L (ref 98–111)
Creatinine, Ser: 0.88 mg/dL (ref 0.61–1.24)
GFR, Estimated: >60 mL/min (ref >60)
Glucose, Bld: 81 mg/dL (ref 70–99)
Potassium: 4.2 mmol/L (ref 3.5–5.1)
Sodium: 136 mmol/L (ref 135–145)
Total Bilirubin: 4.6 mg/dL — ABNORMAL HIGH (ref 0.3–1.2)
Total Protein: 5 g/dL — ABNORMAL LOW (ref 6.5–8.1)

## 2021-07-24 LAB — TYPE AND SCREEN
ABO/RH(D): O POS
Antibody Screen: NEGATIVE
Unit division: 0

## 2021-07-24 LAB — ECHOCARDIOGRAM COMPLETE
AR max vel: 2.61 cm2
AV Area VTI: 1.96 cm2
AV Area mean vel: 2.37 cm2
AV Mean grad: 4 mmHg
AV Peak grad: 7.2 mmHg
Ao pk vel: 1.34 m/s
Area-P 1/2: 2.71 cm2
Height: 72 in
MV VTI: 2.75 cm2
S' Lateral: 2.3 cm
Weight: 2320 oz

## 2021-07-24 LAB — CBC
HCT: 27.7 % — ABNORMAL LOW (ref 39.0–52.0)
Hemoglobin: 8.8 g/dL — ABNORMAL LOW (ref 13.0–17.0)
MCH: 27.8 pg (ref 26.0–34.0)
MCHC: 31.8 g/dL (ref 30.0–36.0)
MCV: 87.4 fL (ref 80.0–100.0)
Platelets: 36 10*3/uL — ABNORMAL LOW (ref 150–400)
RBC: 3.17 MIL/uL — ABNORMAL LOW (ref 4.22–5.81)
RDW: 23.5 % — ABNORMAL HIGH (ref 11.5–15.5)
WBC: 3.3 10*3/uL — ABNORMAL LOW (ref 4.0–10.5)
nRBC: 0 % (ref 0.0–0.2)

## 2021-07-24 LAB — PROTIME-INR
INR: 2 — ABNORMAL HIGH (ref 0.8–1.2)
Prothrombin Time: 22.5 seconds — ABNORMAL HIGH (ref 11.4–15.2)

## 2021-07-24 LAB — HEPATITIS PANEL, ACUTE
HCV Ab: NONREACTIVE
Hep A IgM: NONREACTIVE
Hep B C IgM: NONREACTIVE
Hepatitis B Surface Ag: NONREACTIVE

## 2021-07-24 LAB — BPAM RBC
Blood Product Expiration Date: 202208222359
ISSUE DATE / TIME: 202207241611
Unit Type and Rh: 5100

## 2021-07-24 LAB — PATHOLOGIST SMEAR REVIEW

## 2021-07-24 LAB — APTT: aPTT: 47 seconds — ABNORMAL HIGH (ref 24–36)

## 2021-07-24 LAB — HIV ANTIBODY (ROUTINE TESTING W REFLEX): HIV Screen 4th Generation wRfx: NONREACTIVE

## 2021-07-24 MED ORDER — FUROSEMIDE 40 MG PO TABS
40.0000 mg | ORAL_TABLET | Freq: Every day | ORAL | Status: DC
  Administered 2021-07-24: 40 mg via ORAL
  Filled 2021-07-24: qty 1

## 2021-07-24 MED ORDER — ALBUMIN HUMAN 25 % IV SOLN
12.5000 g | Freq: Once | INTRAVENOUS | Status: DC
  Filled 2021-07-24: qty 50

## 2021-07-24 MED ORDER — VITAMIN K1 10 MG/ML IJ SOLN
10.0000 mg | Freq: Every day | INTRAMUSCULAR | Status: DC
  Administered 2021-07-24: 10 mg via SUBCUTANEOUS
  Filled 2021-07-24: qty 1

## 2021-07-24 MED ORDER — FUROSEMIDE 40 MG PO TABS: 40.0000 mg | ORAL_TABLET | Freq: Once | ORAL | Status: DC

## 2021-07-24 NOTE — Progress Notes (Signed)
*  PRELIMINARY RESULTS* Echocardiogram 2D Echocardiogram has been performed.  Joanette Gula Alvey Brockel 07/24/2021, 9:24 AM

## 2021-07-24 NOTE — ED Notes (Signed)
Pt demanded to take all iv infusions to be disconnected. MD notified of patient wished and patient wish to leave hospital. Awaiting md direction.

## 2021-07-24 NOTE — Progress Notes (Signed)
OT Cancellation Note  Patient Details Name: Johnny Dunlap MRN: [id number] DOB: Nov 10, 1950   Cancelled Treatment:    Reason Eval/Treat Not Completed: Fatigue/lethargy limiting ability to participate  Thank you for OT consult.  OT attempted to engage pt in evaluation, but pt asleep.  Pt briefly woke with verbal cues from OT, but promptly fell back asleep and declined to respond to any prompts for participation in evaluation.  Will continue to follow up at next opportunity.  Dennison Nancy, OTR/L 07/24/21, 12:01 PM

## 2021-07-24 NOTE — ED Notes (Signed)
Lab at bedside attempting to get morning labs. PT in bed in no acute distress.

## 2021-07-24 NOTE — Progress Notes (Signed)
PROGRESS NOTE  Johnny Dunlap  DOB: October 01, 1950  PCP: Aviva Kluver LKJ:[id number]  DOA: 07/23/2021  LOS: 1 day  Hospital Day: 2   Chief Complaint  Patient presents with   Shortness of Breath    Brief narrative: Johnny Dunlap is a 71 y.o. male, previously alcoholic with PMH significant for alcoholic liver cirrhosis, COPD not on oxygen, continues to smoke around 3 cigarettes a day. 7/24, patient [place] by [place] for shortness of breath, bilateral lower extremity edema.  Symptoms progressing for last several weeks.  Patient currently is homeless and apparently had been traveling frequently on the train for the past 3 months in an attempt to find some family in different parts of the state.   In the ED, patient was afebrile, blood pressure in 90s. Labs showed WBC count at 2.3, hemoglobin low at 6.9, platelet low at 44. COVID PCR positive CT chest with emphysema, bilateral pleural effusion right more than left, liver cirrhosis with evidence of portal hypertension including splenomegaly, ascites and esophageal varices.  In the ED, patient was started on IV fluid and blood transfusion.   Admitted to hospitalist service.   Received 1 unit of PRBC overnight.     Subjective: Patient was seen and examined this am.  Lying on bed.  Not in distress.  No new symptoms.  Wants to eat.  Understands he is n.p.o. for potential EGD today.  Assessment/Plan: Possible acute GI bleeding in the setting of liver cirrhosis -Presented with shortness of breath, pedal edema, generalized weakness -No report of obvious bleeding but noted to have hemoglobin low at 6.9 -GI consulted.  Per recommendation, patient has been started on Protonix drip, octreotide drip and IV Rocephin. -Currently NPO.  Acute anemia -Presented with a low hemoglobin of 6.9 with ferritin low at 29.  May have chronic anemia because of coexisting cirrhosis but baseline hemoglobin not available -Monitor PRBC was given  overnight.  Hemoglobin improved to 8.8 this morning.  No active bleeding.  Continue to monitor Recent Labs    07/23/21 0950 07/23/21 1514 07/24/21 0721  HGB 6.9*  --  8.8*  MCV 85.7  --  87.4  FERRITIN 29  --   --   TIBC 360  --   --   IRON 30*  --   --   RETICCTPCT  --  2.9  --    Pancytopenia -Likely due to liver cirrhosis itself.  WBC count has improved a little bit but platelet count is further down this morning.  Continue to monitor.  Avoid heparin products at this time.  Thrombocytopenia. Recent Labs  Lab 07/23/21 0950 07/24/21 0721  WBC 2.3* 3.3*  HGB 6.9* 8.8*  HCT 22.1* 27.7*  MCV 85.7 87.4  PLT 44* 36*   COVID-19 infection -Shortness of breath probably not due to COVID.  No infiltrate or consolidation seen on CT chest. -On admission, patient was started on IV remdesivir, will run a short course of 3 days.  No need of steroids. -Continue monitor respiratory status.  Alcoholic liver cirrhosis Anasarca due to portal hypertension -As evidenced by bilateral pedal edema, ascites, bilateral pleural effusion, R>L. -Would benefit from diuresis but blood pressure is also running on the lower side of normal. -I would cautiously diurese this patient with Lasix 40 mg oral daily.  May not be able to tolerate standard regimen with beta-blocker or Aldactone at this time. -Continue to monitor intake and output. -Pending echocardiogram.  Hypokalemia -Potassium level improved with  replacement.  Continue to monitor with diuresis. Recent Labs  Lab 07/23/21 0950 07/24/21 0721  K 3.1* 4.2   COPD  Chronic smoker -Counseled to quit.  Nicotine patch offered. -Evidence of emphysema and CT chest.  Homeless status -Patient reports that 2 weeks ago he was hospitalized at [facility], could not specify for what reason.  Once he got out, he learned that his nephew with the power of attorney had sold his car and townhome.  Patient states he has been homeless since then. -Social work consult  placed.  Impaired mobility -PT eval ordered.   Mobility: Encourage ambulation Code Status:   Code Status: Full Code  Nutritional status: Body mass index is 19.67 kg/m.     Diet:  Diet Order             Diet NPO time specified  Diet effective now                  DVT prophylaxis:  SCDs Start: 07/23/21 1818   Antimicrobials: IV Rocephin Fluid: None Consultants: GI Family Communication: None at bedside  Status is: Inpatient  Remains inpatient appropriate because: Needs GI evaluation  Dispo: The patient is from: Homeless              Anticipated d/c is to: Pending PT eval              Patient currently is not medically stable to d/c.   Difficult to place patient No     Infusions:   cefTRIAXone (ROCEPHIN)  IV Stopped (07/23/21 2029)   octreotide  (SANDOSTATIN)    IV infusion 50 mcg/hr (07/24/21 0900)   pantoprazole 8 mg/hr (07/24/21 0901)   remdesivir 100 mg in NS 100 mL 100 mg (07/24/21 0942)    Scheduled Meds:  furosemide  40 mg Oral Daily   [START ON 07/27/2021] pantoprazole  40 mg Intravenous Q12H    Antimicrobials: Anti-infectives (From admission, onward)    Start     Dose/Rate Route Frequency Ordered Stop   07/24/21 1000  remdesivir 100 mg in sodium chloride 0.9 % 100 mL IVPB       See Hyperspace for full Linked Orders Report.   100 mg 200 mL/hr over 30 Minutes Intravenous Daily 07/23/21 1749 07/28/21 0959   07/23/21 2000  cefTRIAXone (ROCEPHIN) 1 g in sodium chloride 0.9 % 100 mL IVPB        1 g 200 mL/hr over 30 Minutes Intravenous Every 24 hours 07/23/21 1828     07/23/21 1830  remdesivir 200 mg in sodium chloride 0.9% 250 mL IVPB       See Hyperspace for full Linked Orders Report.   200 mg 580 mL/hr over 30 Minutes Intravenous Once 07/23/21 1749 07/23/21 2131       PRN meds: acetaminophen **OR** acetaminophen, morphine injection, ondansetron **OR** ondansetron (ZOFRAN) IV, oxyCODONE, polyethylene glycol   Objective: Vitals:    07/24/21 0522 07/24/21 0730  BP: 100/63 96/71  Pulse: 61 68  Resp: 18 (!) 22  Temp:    SpO2: 96% 95%    Intake/Output Summary (Last 24 hours) at 07/24/2021 0953 Last data filed at 07/23/2021 1855 Gross per 24 hour  Intake 1421.67 ml  Output --  Net 1421.67 ml   Filed Weights   07/23/21 0947  Weight: 65.8 kg   Weight change:  Body mass index is 19.67 kg/m.   Physical Exam: General exam: Pleasant, elderly Caucasian male.  Not in physical distress Skin: No rashes, lesions or  ulcers. HEENT: Atraumatic, normocephalic, no obvious bleeding Lungs: Clear to auscultation bilaterally CVS: Regular rate and rhythm, no murmur GI/Abd soft, nontender, mild distention from ascites, bowel sound present CNS: Alert, awake, oriented x3 Psychiatry: Depressed look Extremities: Pedal edema 1+ bilaterally with no evidence of cellulitis.  No calf tenderness.  Data Review: I have personally reviewed the laboratory data and studies available.  Recent Labs  Lab 07/23/21 0950 07/24/21 0721  WBC 2.3* 3.3*  HGB 6.9* 8.8*  HCT 22.1* 27.7*  MCV 85.7 87.4  PLT 44* 36*   Recent Labs  Lab 07/23/21 0950 07/24/21 0721  NA 136 136  K 3.1* 4.2  CL 107 110  CO2 21* 19*  GLUCOSE 100* 81  BUN 12 10  CREATININE 1.00 0.88  CALCIUM 8.1* 7.7*    F/u labs ordered. Unresulted Labs (From admission, onward)     Start     Ordered   07/25/21 0500  Ammonia  Tomorrow morning,   STAT        07/24/21 0816   07/25/21 0500  CBC with Differential/Platelet  Daily,   STAT      07/24/21 0816   07/25/21 0500  Comprehensive metabolic panel  Daily,   STAT      07/24/21 0816   07/25/21 0500  Magnesium  Tomorrow morning,   STAT        07/24/21 0816   07/25/21 0500  Phosphorus  Tomorrow morning,   STAT        07/24/21 0816   07/24/21 0500  HIV Antibody (routine testing w rflx)  Once,   AD        07/24/21 0500   07/24/21 0500  Hepatitis panel, acute  Tomorrow morning,   STAT        07/23/21 2105   07/23/21 1500   Pathologist smear review  Once,   AD        07/23/21 1500            Signed, Lorin Glass, MD [facility] 07/24/2021

## 2021-07-24 NOTE — Consult Note (Addendum)
Wyline Mood , MD [address], [place], [place], [address] [address], [address], [place], [place], [address] Phone: [phone]  Fax: [phone]  Consultation  Referring Provider:     Dr Pola Corn Primary Care Physician:  Pcp, No Primary Gastroenterologist: None          Reason for Consultation:     Anemia   Date of Admission:  07/23/2021 Date of Consultation:  07/24/2021         HPI:   Johnny Dunlap is a 71 y.o. male with a history of COPD, smoker, ex alcohol abuse, homeless , presented to the ER with shortness of breath on 07/23/2021. Came into the ER with shortness of greath .    In the ER found to be COVID positive. Moderate right and lft pleural effusion. Hb 6.9 grams brown stool noted in the ER . ECHO performed. Ct chest was performed and showed features of portal hypertension. INR 2.0 , repeat Hb 8.8 grams with mcv 87 , platelet count low at 36 , BNP elevated.Low iron at 29.   He absolutely denies any hematemesis, hemoptysis, nasal bleeds, blood per rectum.  Denies any use of NSAIDs.  Denies any recent use of alcohol.  When I went into the room he was profoundly coughing denies any other complaint.  Past Medical History:  Diagnosis Date   Cirrhosis (HCC)    COPD (chronic obstructive pulmonary disease) (HCC)     Past Surgical History:  Procedure Laterality Date   MOUTH SURGERY      Prior to Admission medications   Not on File    History reviewed. No pertinent family history.   Social History   Tobacco Use   Smoking status: Every Day    Types: Cigarettes   Smokeless tobacco: Never  Substance Use Topics   Alcohol use: Not Currently    Allergies as of 07/23/2021   (No Known Allergies)    Review of Systems:    All systems reviewed and negative except where noted in HPI.   Physical Exam:  Vital signs in last 24 hours: Temp:  [98.1 F (36.7 C)-99.2 F (37.3 C)] 98.2 F (36.8 C) (07/24 1854) Pulse Rate:  [60-84] 68 (07/25 0730) Resp:  [16-22] 22  (07/25 0730) BP: (94-102)/(48-71) 96/71 (07/25 0730) SpO2:  [94 %-100 %] 95 % (07/25 0730) Weight:  [65.8 kg] 65.8 kg (07/24 0947)   General:   Appears comfortable but having bouts of coughing Head:  Normocephalic and atraumatic. Eyes:   No icterus.   Conjunctiva pink. PERRLA. Ears:  Normal auditory acuity. Lungs: Decreased air entry bilaterally equal, a lot of coughing during examination Heart:  Regular rate and rhythm;  Without murmur, clicks, rubs or gallops Abdomen:  Soft, nondistended, nontender. Normal bowel sounds. No appreciable masses or hepatomegaly.  No rebound or guarding.  Neurologic:  Alert and oriented x3;  grossly normal neurologically. Skin:  Intact without significant lesions or rashes. Cervical Nodes:  No significant cervical adenopathy. Psych:  Alert and cooperative. Normal affect.  LAB RESULTS: Recent Labs    07/23/21 0950 07/24/21 0721  WBC 2.3* 3.3*  HGB 6.9* 8.8*  HCT 22.1* 27.7*  PLT 44* 36*   BMET Recent Labs    07/23/21 0950 07/24/21 0721  NA 136 136  K 3.1* 4.2  CL 107 110  CO2 21* 19*  GLUCOSE 100* 81  BUN 12 10  CREATININE 1.00 0.88  CALCIUM 8.1* 7.7*   LFT Recent Labs  07/23/21 0950 07/24/21 0721  PROT 5.2* 5.0*  ALBUMIN 2.5*  2.5* 2.2*  AST 50* 55*  ALT 21 24  ALKPHOS 122 102  BILITOT 5.0* 4.6*  BILIDIR 1.6*  --   IBILI 3.4*  --    PT/INR Recent Labs    07/24/21 0721  LABPROT 22.5*  INR 2.0*    STUDIES: DG Chest 2 View  Result Date: 07/23/2021 CLINICAL DATA:  shortness of breath EXAM: CHEST - 2 VIEW COMPARISON:  None. FINDINGS: The cardiomediastinal silhouette is mildly enlarged in contour. Small RIGHT pleural effusion. No pneumothorax. Flattening of the diaphragms and diffuse reticular opacities are consistent with history of underlying emphysema. Rounded opacity along the RIGHT paramediastinal border inferiorly. Visualized abdomen is unremarkable. No acute osseous abnormality. IMPRESSION: 1. Rounded opacity along  the RIGHT inferior paramediastinal border is nonspecific. Given underlying emphysema, recommend further evaluation with dedicated chest CT. Electronically Signed   By: Meda Klinefelter MD   On: 07/23/2021 14:06   CT Chest W Contrast  Result Date: 07/23/2021 CLINICAL DATA:  Abnormal xray - lung opacity/opacities EXAM: CT CHEST WITH CONTRAST TECHNIQUE: Multidetector CT imaging of the chest was performed during intravenous contrast administration. CONTRAST:  42mL OMNIPAQUE IOHEXOL 300 MG/ML  SOLN COMPARISON:  Same day radiograph FINDINGS: Cardiovascular: Heart is mildly enlarged. Three-vessel coronary artery atherosclerotic calcifications. No significant pericardial effusion. Dense aortic valve calcifications. LEFT vertebral artery arises from the aortic arch. Atherosclerotic calcifications of the aorta. Mediastinum/Nodes: Thyroid is unremarkable. No axillary adenopathy. Mildly prominent mediastinal lymph nodes with representative pretracheal lymph node measuring 9 mm in the short axis (series 2, image 68). Lungs/Pleura: Moderate centrilobular and paraseptal emphysema. Biapical irregular opacities most consistent with scar. There is a moderate RIGHT pleural effusion. There is a small LEFT pleural effusion. Bibasilar enhancing platelike opacities. There is a suggestion of a more focal irregular nodule which measures 7 x 7 mm (series 2, image 71). Upper Abdomen: Nodular contour of the liver. Recanalized paraumbilical vein. Splenomegaly. Esophageal varices. Moderate hiatal hernia. Small volume ascites. Nonobstructive RIGHT-sided nephrolithiasis. Musculoskeletal: No fracture is seen. IMPRESSION: 1. Moderate RIGHT and small LEFT pleural effusion with likely bibasilar atelectasis. Given underlying emphysema, recommend follow-up CT in 3-6 months to assess for resolution of more focal possible nodular opacities. 2. Given emphysema, recommend evaluation for candidacy for annual lung cancer screening. 3. Cirrhosis with  sequela of portal venous hypertension including recanalization of the umbilical vein, splenomegaly, ascites and esophageal varices. 4. Questioned nodular opacity corresponds to summation of a distended inferior cavoatrial junction, hiatal hernia, esophageal varices and pleural effusion. Aortic Atherosclerosis (ICD10-I70.0) and Emphysema (ICD10-J43.9). Electronically Signed   By: Meda Klinefelter MD   On: 07/23/2021 16:38   [place] EKG SITE RITE  Result Date: 07/23/2021 If Site Rite image not attached, placement could not be confirmed due to current cardiac rhythm.     Impression / Plan:   Johnny Dunlap is a 71 y.o. y/o male who is homeless and probably has an underlying diagnosis of liver cirrhosis from prior alcohol use presents to the emergency room with shortness of breath.  Also found at the same time to have a low hemoglobin.  No overt blood loss.  No history of hematemesis, melena, rectal bleeding.  Diagnosed with COVID-19 in the ER and has bilateral effusions and infiltrates on the x-ray.  Features of portal hypertension on imaging.  He has iron deficiency on labs, [place] and probably has a degree of malnutrition.  Very likely that the anemia is  not acute but rather chronic and a combination from iron deficiency from probable blood loss from condition such as gave versus nutritional deficiency.  Acutely is issues are due to respiratory failure from COVID-19.  He has a lot of coughing during examination.   Plan  Has iron deficiency - suggest IV iron , check b12,folate  Elevated INR likely due to malnutrition vs acute liver failure: suggest vitamin K 10 mg S.C daily once for 3 days to reverse any malnutrition  Low platelet count : closely monitor - ?related to COVID- if further drops consider hematology input  Monitor CBC and transfuse as needed IV antibiotics for prophylaxis  Diagnostic and therapeutic paracentesis , taking out fluid will help with breathing and decrease need for oxygen  during any endoscopy procedure.  IV octreotide and PPI Would wait to perform any endoscopy as he has just been diganosed with COVID pneumonia, has a lot of coughing, if we have to sedate him at this point , he will likely need to be intubated which would not be good from his overall prognosis. Since there has been no overt blood loss noted in terms of hematemesis or melena , we can watch and wait , does not seem that he has had a variceal bleed, likely chronic blood loss leading to iron deficiency anemia,Obviously if he starts actively bleeding then we would have no choice but to intubate and perform endoscopy .Waiting for now would probably be the best balancing the risks vs benefits in this situation.  9.  Consider thoracocentesis to reduce the amount of fluid around his lungs which would make it safer in case we need to perform an endoscopy.   Thank you for involving me in the care of this patient.      LOS: 1 day   Wyline Mood, MD  07/24/2021, 9:02 AM

## 2021-07-24 NOTE — Evaluation (Signed)
Physical Therapy Evaluation Patient Details Name: Johnny Dunlap MRN: [id number] DOB: [birth year] Today's Date: 07/24/2021   History of Present Illness  Pt is a 71 y.o. M with PMH of COPD, cirrhosis presenting to the ED for concerns of SOB and BLE edema. Pt was admitted additionally for acute GI bleed.  Clinical Impression  Pt asleep in bed, easily awoken to verbal cues. Pt required encouragement to work with PT. Pt is homeless and states he is not interested in finding an alterative solution. PLOF includes WC for community distances, able to ambulate short distances and stairs with wall or hand rail support.  Pt requires min-guard for bed mobility and transfers. Utilized a RW for transfer support and ambulation. Min-assist for guiding walker with ambulation. Pt noted increased sharp pain to bilateral feet upon standing, which dissipates at rest. Skilled PT intervention is indicated to address deficits in function, mobility, and to return to PLOF as able.  Outpatient PT is recommended.     Follow Up Recommendations Outpatient PT    Equipment Recommendations  Rolling walker with 5" wheels    Recommendations for Other Services       Precautions / Restrictions Precautions Precautions: Fall Restrictions Weight Bearing Restrictions: No      Mobility  Bed Mobility Overal bed mobility: Needs Assistance Bed Mobility: Supine to Sit     Supine to sit: Min guard          Transfers Overall transfer level: Needs assistance Equipment used: Rolling walker (2 wheeled) Transfers: Sit to/from Stand Sit to Stand: Min guard            Ambulation/Gait Ambulation/Gait assistance: Editor, commissioning (Feet): 2 Feet Assistive device: Rolling walker (2 wheeled) Gait Pattern/deviations: Step-to pattern     General Gait Details: Pt notes increased pain with ambulation  Stairs            Wheelchair Mobility    Modified Rankin (Stroke Patients Only)       Balance Overall  balance assessment: Needs assistance Sitting-balance support: Bilateral upper extremity supported Sitting balance-Leahy Scale: Fair Sitting balance - Comments: challenged by maintaining posture without BUE support, posterior lean without UE support   Standing balance support: Bilateral upper extremity supported Standing balance-Leahy Scale: Fair                               Pertinent Vitals/Pain Pain Assessment: Faces Faces Pain Scale: Hurts little more Pain Location: bilateral feet plantar surface Pain Descriptors / Indicators: Sharp;Sore;Tender Pain Intervention(s): Limited activity within patient's tolerance;Monitored during session;Repositioned    Home Living Family/patient expects to be discharged to:: Shelter/Homeless                      Prior Function Level of Independence: Independent with assistive device(s)         Comments: Utilizes WC for community mobility, ambulates by holding onto surfaces for support     Hand Dominance   Dominant Hand: Right    Extremity/Trunk Assessment   Upper Extremity Assessment Upper Extremity Assessment: Overall WFL for tasks assessed    Lower Extremity Assessment Lower Extremity Assessment: RLE deficits/detail;LLE deficits/detail RLE Deficits / Details: Able to move against gravity RLE Sensation: WNL LLE Deficits / Details: Able to move against gravity, knee buckle with WB LLE Sensation: WNL    Cervical / Trunk Assessment Cervical / Trunk Assessment: Normal  Communication   Communication: No difficulties  Cognition  Arousal/Alertness: Awake/alert Behavior During Therapy: WFL for tasks assessed/performed Overall Cognitive Status: Within Functional Limits for tasks assessed                                 General Comments: AOx4      General Comments      Exercises     Assessment/Plan    PT Assessment Patient needs continued PT services  PT Problem List Decreased  strength;Decreased range of motion;Decreased activity tolerance;Decreased balance;Decreased mobility       PT Treatment Interventions Gait training;Stair training;Functional mobility training;Therapeutic activities;Therapeutic exercise;Balance training;Neuromuscular re-education;Wheelchair mobility training    PT Goals (Current goals can be found in the Care Plan section)  Acute Rehab PT Goals Patient Stated Goal: to improve WC mobility PT Goal Formulation: With patient Time For Goal Achievement: 08/07/21 Potential to Achieve Goals: Fair    Frequency Min 2X/week   Barriers to discharge        Co-evaluation               AM-PAC PT "6 Clicks" Mobility  Outcome Measure Help needed turning from your back to your side while in a flat bed without using bedrails?: A Little Help needed moving from lying on your back to sitting on the side of a flat bed without using bedrails?: A Little Help needed moving to and from a bed to a chair (including a wheelchair)?: A Little Help needed standing up from a chair using your arms (e.g., wheelchair or bedside chair)?: A Little Help needed to walk in hospital room?: A Lot Help needed climbing 3-5 steps with a railing? : A Lot 6 Click Score: 16    End of Session Equipment Utilized During Treatment: Gait belt Activity Tolerance: Patient limited by pain Patient left: in bed;with call bell/phone within reach   PT Visit Diagnosis: Other abnormalities of gait and mobility (R26.89);Muscle weakness (generalized) (M62.81)    Time: 5009-3818 PT Time Calculation (min) (ACUTE ONLY): 36 min   Charges:   PT Evaluation $PT Eval Low Complexity: 1 Low PT Treatments $Therapeutic Activity: 23-37 mins       [employer], SPT

## 2021-07-24 NOTE — ED Notes (Signed)
Per MD, no need for second unit of blood

## 2021-07-24 NOTE — ED Notes (Signed)
PT DEMANDED TO LEAVE , ASSISTED PT TO ED ENTRANCE MD MADE AWARE

## 2021-07-24 NOTE — ED Notes (Signed)
MD made aware that second unit of blood not given.  Awaiting morning lab results before deciding course of action.

## 2021-08-01 ENCOUNTER — Inpatient Hospital Stay
Admission: EM | Admit: 2021-08-01 | Discharge: 2021-08-15 | DRG: 871 | Disposition: A | Discharge status: Skilled Nursing Facility | Payer: Medicare (Managed Care) | Attending: Internal Medicine | Admitting: Internal Medicine

## 2021-08-01 ENCOUNTER — Emergency Department: Payer: Medicare (Managed Care)

## 2021-08-01 ENCOUNTER — Other Ambulatory Visit: Payer: Self-pay

## 2021-08-01 DIAGNOSIS — R188 Other ascites: Secondary | ICD-10-CM | POA: Diagnosis present

## 2021-08-01 DIAGNOSIS — Z5900 Homelessness unspecified: Secondary | ICD-10-CM

## 2021-08-01 DIAGNOSIS — Z66 Do not resuscitate: Secondary | ICD-10-CM | POA: Diagnosis present

## 2021-08-01 DIAGNOSIS — J432 Centrilobular emphysema: Secondary | ICD-10-CM | POA: Diagnosis present

## 2021-08-01 DIAGNOSIS — D509 Iron deficiency anemia, unspecified: Secondary | ICD-10-CM | POA: Diagnosis present

## 2021-08-01 DIAGNOSIS — L03114 Cellulitis of left upper limb: Secondary | ICD-10-CM | POA: Insufficient documentation

## 2021-08-01 DIAGNOSIS — E876 Hypokalemia: Secondary | ICD-10-CM | POA: Diagnosis not present

## 2021-08-01 DIAGNOSIS — K7469 Other cirrhosis of liver: Secondary | ICD-10-CM | POA: Diagnosis not present

## 2021-08-01 DIAGNOSIS — K7460 Unspecified cirrhosis of liver: Secondary | ICD-10-CM | POA: Diagnosis present

## 2021-08-01 DIAGNOSIS — Z6820 Body mass index (BMI) 20.0-20.9, adult: Secondary | ICD-10-CM

## 2021-08-01 DIAGNOSIS — J90 Pleural effusion, not elsewhere classified: Secondary | ICD-10-CM | POA: Diagnosis present

## 2021-08-01 DIAGNOSIS — E872 Acidosis, unspecified: Secondary | ICD-10-CM | POA: Insufficient documentation

## 2021-08-01 DIAGNOSIS — Z5901 Sheltered homelessness: Secondary | ICD-10-CM | POA: Diagnosis not present

## 2021-08-01 DIAGNOSIS — F40240 Claustrophobia: Secondary | ICD-10-CM | POA: Diagnosis present

## 2021-08-01 DIAGNOSIS — I9589 Other hypotension: Secondary | ICD-10-CM | POA: Diagnosis present

## 2021-08-01 DIAGNOSIS — L0390 Cellulitis, unspecified: Secondary | ICD-10-CM

## 2021-08-01 DIAGNOSIS — D696 Thrombocytopenia, unspecified: Secondary | ICD-10-CM | POA: Diagnosis present

## 2021-08-01 DIAGNOSIS — Z635 Disruption of family by separation and divorce: Secondary | ICD-10-CM | POA: Diagnosis not present

## 2021-08-01 DIAGNOSIS — E43 Unspecified severe protein-calorie malnutrition: Secondary | ICD-10-CM | POA: Diagnosis present

## 2021-08-01 DIAGNOSIS — U071 COVID-19: Secondary | ICD-10-CM | POA: Diagnosis present

## 2021-08-01 DIAGNOSIS — A419 Sepsis, unspecified organism: Secondary | ICD-10-CM | POA: Diagnosis present

## 2021-08-01 DIAGNOSIS — F17210 Nicotine dependence, cigarettes, uncomplicated: Secondary | ICD-10-CM | POA: Diagnosis present

## 2021-08-01 DIAGNOSIS — I8510 Secondary esophageal varices without bleeding: Secondary | ICD-10-CM | POA: Diagnosis present

## 2021-08-01 DIAGNOSIS — K766 Portal hypertension: Secondary | ICD-10-CM | POA: Diagnosis present

## 2021-08-01 DIAGNOSIS — R6520 Severe sepsis without septic shock: Secondary | ICD-10-CM | POA: Diagnosis not present

## 2021-08-01 LAB — CBC WITH DIFFERENTIAL/PLATELET
Abs Immature Granulocytes: 0.05 10*3/uL (ref 0.00–0.07)
Basophils Absolute: 0 10*3/uL (ref 0.0–0.1)
Basophils Relative: 0 %
Eosinophils Absolute: 0 10*3/uL (ref 0.0–0.5)
Eosinophils Relative: 0 %
HCT: 26.6 % — ABNORMAL LOW (ref 39.0–52.0)
Hemoglobin: 8.6 g/dL — ABNORMAL LOW (ref 13.0–17.0)
Immature Granulocytes: 1 %
Lymphocytes Relative: 8 %
Lymphs Abs: 0.7 10*3/uL (ref 0.7–4.0)
MCH: 28.3 pg (ref 26.0–34.0)
MCHC: 32.3 g/dL (ref 30.0–36.0)
MCV: 87.5 fL (ref 80.0–100.0)
Monocytes Absolute: 0.7 10*3/uL (ref 0.1–1.0)
Monocytes Relative: 8 %
Neutro Abs: 7.8 10*3/uL — ABNORMAL HIGH (ref 1.7–7.7)
Neutrophils Relative %: 83 %
Platelets: 45 10*3/uL — ABNORMAL LOW (ref 150–400)
RBC: 3.04 MIL/uL — ABNORMAL LOW (ref 4.22–5.81)
RDW: 24.3 % — ABNORMAL HIGH (ref 11.5–15.5)
Smear Review: NORMAL
WBC: 9.4 10*3/uL (ref 4.0–10.5)
nRBC: 0 % (ref 0.0–0.2)

## 2021-08-01 LAB — COMPREHENSIVE METABOLIC PANEL
ALT: 25 U/L (ref 0–44)
AST: 63 U/L — ABNORMAL HIGH (ref 15–41)
Albumin: 2.5 g/dL — ABNORMAL LOW (ref 3.5–5.0)
Alkaline Phosphatase: 107 U/L (ref 38–126)
Anion gap: 9 (ref 5–15)
BUN: 16 mg/dL (ref 8–23)
CO2: 19 mmol/L — ABNORMAL LOW (ref 22–32)
Calcium: 7.8 mg/dL — ABNORMAL LOW (ref 8.9–10.3)
Chloride: 108 mmol/L (ref 98–111)
Creatinine, Ser: 1.02 mg/dL (ref 0.61–1.24)
GFR, Estimated: >60 mL/min (ref >60)
Glucose, Bld: 107 mg/dL — ABNORMAL HIGH (ref 70–99)
Potassium: 3.3 mmol/L — ABNORMAL LOW (ref 3.5–5.1)
Sodium: 136 mmol/L (ref 135–145)
Total Bilirubin: 8.7 mg/dL — ABNORMAL HIGH (ref 0.3–1.2)
Total Protein: 5.8 g/dL — ABNORMAL LOW (ref 6.5–8.1)

## 2021-08-01 LAB — RESP PANEL BY RT-PCR (FLU A&B, COVID) ARPGX2
Influenza A by PCR: NEGATIVE
Influenza B by PCR: NEGATIVE
SARS Coronavirus 2 by RT PCR: POSITIVE — AB

## 2021-08-01 LAB — TROPONIN I (HIGH SENSITIVITY)
Troponin I (High Sensitivity): 14 ng/L (ref <18)
Troponin I (High Sensitivity): 12 ng/L (ref <18)

## 2021-08-01 LAB — CK: Total CK: 386 U/L (ref 49–397)

## 2021-08-01 LAB — SEDIMENTATION RATE: Sed Rate: 19 mm/hr (ref 0–20)

## 2021-08-01 LAB — PROCALCITONIN: Procalcitonin: 1.16 ng/mL

## 2021-08-01 LAB — APTT: aPTT: 43 seconds — ABNORMAL HIGH (ref 24–36)

## 2021-08-01 LAB — PROTIME-INR
INR: 2.1 — ABNORMAL HIGH (ref 0.8–1.2)
Prothrombin Time: 23.1 seconds — ABNORMAL HIGH (ref 11.4–15.2)

## 2021-08-01 LAB — LACTIC ACID, PLASMA
Lactic Acid, Venous: 4.2 mmol/L (ref 0.5–1.9)
Lactic Acid, Venous: 3.6 mmol/L (ref 0.5–1.9)

## 2021-08-01 MED ORDER — LACTATED RINGERS IV SOLN: INTRAVENOUS | Status: AC

## 2021-08-01 MED ORDER — CEFAZOLIN SODIUM-DEXTROSE 2-4 GM/100ML-% IV SOLN
2.0000 g | Freq: Three times a day (TID) | INTRAVENOUS | Status: DC
  Administered 2021-08-02 – 2021-08-06 (×12): 2 g via INTRAVENOUS
  Filled 2021-08-01 (×15): qty 100

## 2021-08-01 MED ORDER — LACTATED RINGERS IV BOLUS (SEPSIS): 1000.0000 mL | Freq: Once | INTRAVENOUS | Status: DC

## 2021-08-01 MED ORDER — ONDANSETRON HCL 4 MG/2ML IJ SOLN: 4.0000 mg | Freq: Four times a day (QID) | INTRAMUSCULAR | Status: DC | PRN

## 2021-08-01 MED ORDER — ACETAMINOPHEN 325 MG PO TABS
650.0000 mg | ORAL_TABLET | Freq: Four times a day (QID) | ORAL | Status: DC | PRN
  Administered 2021-08-06 – 2021-08-08 (×4): 650 mg via ORAL
  Filled 2021-08-01 (×4): qty 2

## 2021-08-01 MED ORDER — ACETAMINOPHEN 650 MG RE SUPP: 650.0000 mg | Freq: Four times a day (QID) | RECTAL | Status: DC | PRN

## 2021-08-01 MED ORDER — OXYCODONE HCL 5 MG PO TABS
5.0000 mg | ORAL_TABLET | ORAL | Status: DC | PRN
  Administered 2021-08-01 – 2021-08-08 (×9): 5 mg via ORAL
  Filled 2021-08-01 (×9): qty 1

## 2021-08-01 MED ORDER — LACTATED RINGERS IV SOLN: INTRAVENOUS | Status: DC

## 2021-08-01 MED ORDER — CEFAZOLIN SODIUM-DEXTROSE 2-4 GM/100ML-% IV SOLN
2.0000 g | Freq: Three times a day (TID) | INTRAVENOUS | Status: DC
  Filled 2021-08-01 (×3): qty 100

## 2021-08-01 MED ORDER — LACTATED RINGERS IV BOLUS (SEPSIS)
1000.0000 mL | Freq: Once | INTRAVENOUS | Status: AC
  Administered 2021-08-01: 1000 mL via INTRAVENOUS

## 2021-08-01 MED ORDER — VANCOMYCIN HCL IN DEXTROSE 1-5 GM/200ML-% IV SOLN
1000.0000 mg | Freq: Once | INTRAVENOUS | Status: AC
  Administered 2021-08-01: 1000 mg via INTRAVENOUS
  Filled 2021-08-01: qty 200

## 2021-08-01 MED ORDER — SODIUM CHLORIDE 0.9 % IV SOLN
2.0000 g | Freq: Once | INTRAVENOUS | Status: AC
  Administered 2021-08-01: 2 g via INTRAVENOUS
  Filled 2021-08-01: qty 20

## 2021-08-01 MED ORDER — ONDANSETRON HCL 4 MG PO TABS: 4.0000 mg | ORAL_TABLET | Freq: Four times a day (QID) | ORAL | Status: DC | PRN

## 2021-08-01 NOTE — Progress Notes (Signed)
PHARMACY -  BRIEF ANTIBIOTIC NOTE   Pharmacy has received consult(s) for Vamcomycin from an ED provider.  The patient's profile has been reviewed for ht/wt/allergies/indication/available labs.    One time order(s) placed for Vancomycin 1000mg   Further antibiotics/pharmacy consults should be ordered by admitting physician if indicated.                       Thank you, Dante Cooter Rodriguez-Guzman PharmD, BCPS 08/01/2021 3:02 PM

## 2021-08-01 NOTE — ED Provider Notes (Signed)
3:58 PM Assumed care for off going team.   Blood pressure (!) 109/56, pulse 84, temperature 99.9 F (37.7 C), temperature source Oral, resp. rate 20, SpO2 98 %.  See their HPI for full report but in brief recommend admission.  4:04 PM  Patient's lactate is significantly elevated at 4.2.  Given the significant edema throughout his body.  Fluid resuscitation is being held due to him being a cirrhotic I do not want to cause him to have respiratory compromise.  We will start with the initial liter and see what his lactate is doing.  If it is trending up we will consider additional doses but his lactate could just be elevated due to known cirrhosis and right now his vital signs are completely stable and I think there could be more harm than good if we give a full fluid resuscitation  Work-up is otherwise reassuring.  Will admit for cellulitis.  No DVT noted, no evidence of osteomyelitis.  COVID-positive that was COVID-positive on the 24th       Concha Se, MD 08/01/21 [phone]

## 2021-08-01 NOTE — ED Provider Notes (Signed)
[facility] Emergency Department Provider Note   ____________________________________________   Event Date/Time   First MD Initiated Contact with Patient 08/01/21 1409     (approximate)  I have reviewed the triage vital signs and the nursing notes.   HISTORY  Chief Complaint generalized edema (Edema to LUE, BLE), Homeless, and Abdominal Pain (Abdominal tenderness x years)    HPI Johnny Dunlap is a 71 y.o. male with past medical history of COPD and cirrhosis who presents to the ED complaining of arm pain.  Patient reports that he has had a couple of weeks of pain and swelling affecting his left arm.  He states that it is very painful for him to move the arm and that it has looked very red recently.  He has not sure if he has had a fever and he denies any recent trauma to the arm.  He has noticed swelling in both of his legs, but denies any significant pain similar to his arm.  Patient found by EMS outside of local homeless shelter, covered in dirt and stool.  EMS reports patient febrile and tachycardic.        Past Medical History:  Diagnosis Date   Cirrhosis (HCC)    COPD (chronic obstructive pulmonary disease) (HCC)     Patient Active Problem List   Diagnosis Date Noted   GI bleed 07/23/2021    Past Surgical History:  Procedure Laterality Date   MOUTH SURGERY      Prior to Admission medications   Not on File    Allergies Patient has no known allergies.  History reviewed. No pertinent family history.  Social History Social History   Tobacco Use   Smoking status: Every Day    Types: Cigarettes   Smokeless tobacco: Never  Substance Use Topics   Alcohol use: Not Currently    Review of Systems  Constitutional: No fever/chills Eyes: No visual changes. ENT: No sore throat. Cardiovascular: Denies chest pain. Respiratory: Denies shortness of breath. Gastrointestinal: No abdominal pain.  No nausea, no vomiting.  No diarrhea.  No  constipation. Genitourinary: Negative for dysuria. Musculoskeletal: Negative for back pain.  Positive for left arm pain and swelling.  Positive for lower extremity swelling. Skin: Negative for rash. Neurological: Negative for headaches, focal weakness or numbness.  ____________________________________________   PHYSICAL EXAM:  VITAL SIGNS: ED Triage Vitals  Enc Vitals Group     BP      Pulse      Resp      Temp      Temp src      SpO2      Weight      Height      Head Circumference      Peak Flow      Pain Score      Pain Loc      Pain Edu?      Excl. in GC?     Constitutional: Alert and oriented. Eyes: Conjunctivae are normal. Head: Atraumatic. Nose: No congestion/rhinnorhea. Mouth/Throat: Mucous membranes are moist. Neck: Normal ROM Cardiovascular: Normal rate, regular rhythm. Grossly normal heart sounds.  2+ radial pulses bilaterally, cap refill less than 2 seconds in digits of left upper extremity. Respiratory: Normal respiratory effort.  No retractions. Lungs CTAB. Gastrointestinal: Soft and nontender. No distention. Genitourinary: deferred Musculoskeletal: 1+ pitting edema to knees bilaterally with no associated tenderness.  Left upper extremity edema, erythema, warmth, and tenderness to palpation circumferentially up to the mid bicep.  Small amount  of purulence over ventral surface of left forearm, no focal fluctuance noted.  No bony deformities noted. Neurologic:  Normal speech and language. No gross focal neurologic deficits are appreciated. Skin:  Skin is warm, dry and intact. No rash noted. Psychiatric: Mood and affect are normal. Speech and behavior are normal.  ____________________________________________   LABS (all labs ordered are listed, but only abnormal results are displayed)  Labs Reviewed  RESP PANEL BY RT-PCR (FLU A&B, COVID) [id number]  CULTURE, BLOOD (ROUTINE X 2)  CULTURE, BLOOD (ROUTINE X 2)  URINE CULTURE  LACTIC ACID, PLASMA  LACTIC  ACID, PLASMA  COMPREHENSIVE METABOLIC PANEL  CBC WITH DIFFERENTIAL/PLATELET  PROTIME-INR  APTT  URINALYSIS, COMPLETE (UACMP) WITH MICROSCOPIC  PROCALCITONIN  TROPONIN I (HIGH SENSITIVITY)   ____________________________________________  EKG  ED ECG REPORT I, Chesley Noon, the attending physician, personally viewed and interpreted this ECG.   Date: 08/01/2021  EKG Time: 14:53  Rate: 79  Rhythm: normal sinus rhythm, frequent PVC's noted  Axis: Normal  Intervals:none  ST&T Change: None   PROCEDURES  Procedure(s) performed (including Critical Care):  Procedures   ____________________________________________   INITIAL IMPRESSION / ASSESSMENT AND PLAN / ED COURSE      71 year old male with past medical history of COPD and cirrhosis who presents to the ED with increasing left arm pain and swelling for the past couple of weeks.  Patient noted to be febrile by EMS however temperature has normalized here in the ED and additional vital signs are reassuring.  Patient does have what appears to be a significant cellulitis to his left upper extremity, remains neurovascularly intact to this extremity with no evidence of focal abscess or soft tissue gas.  Low suspicion for necrotizing fasciitis given chronicity of symptoms, we will further assess with ultrasound and x-ray for DVT or bony pathology.  We will further assess with labs, including blood culture and lactic acid, hold off on 30 cc/kg of IV fluids given patient's diffuse edema and stable vitals.  He was started on broad-spectrum antibiotics and anticipate admission for further management of cellulitis.  Patient turned over to oncoming provider pending lab and imaging results.     ____________________________________________   FINAL CLINICAL IMPRESSION(S) / ED DIAGNOSES  Final diagnoses:  Left arm cellulitis  Homelessness     ED Discharge Orders     None        Note:  This document was prepared using Dragon  voice recognition software and may include unintentional dictation errors.    Chesley Noon, MD 08/01/21 [phone]

## 2021-08-01 NOTE — ED Notes (Signed)
US at bedside

## 2021-08-01 NOTE — ED Triage Notes (Signed)
Pt reports swelling to LUE and BLE. Swelling noted to LUE and BLE. Pt states this has been going on for almost a year. Pt also reports homelessness.

## 2021-08-01 NOTE — Progress Notes (Signed)
CODE SEPSIS - PHARMACY COMMUNICATION  **Broad Spectrum Antibiotics should be administered within 1 hour of Sepsis diagnosis**  Time Code Sepsis Called/Page Received: 8/2 @ 1453  Antibiotics Ordered: Ceftriaxone & Vancomycin  Time of 1st antibiotic administration:   Additional action taken by pharmacy: 1534   Stavroula Rohde Rodriguez-Guzman PharmD, BCPS 08/01/2021 3:36 PM

## 2021-08-01 NOTE — Progress Notes (Signed)
Skin swarm completed with Johnny Dunlap. Swelling/weeping/redness noted to LUE. Generalized +1 edema all over. BLE with wounds and abrasions see LDA. Bruised second right toe. Multiple bruising noted to hip, abd and back. Abdominal hernia. Oriented to room and call light system. Pt complaining of hunger - given boxed lunch and cola.

## 2021-08-01 NOTE — ED Notes (Signed)
MD Fuller Plan notified of Covid Positive result at this time.

## 2021-08-01 NOTE — ED Notes (Signed)
DNR bracelet applied to this pt at this time.

## 2021-08-01 NOTE — ED Notes (Signed)
MD Fuller Plan notified of Lactic of 4.2 at this time

## 2021-08-01 NOTE — Progress Notes (Signed)
Elink is following Code Sepsis bundle. °

## 2021-08-01 NOTE — H&P (Addendum)
[facility] [place] [facility]   PATIENT NAME: Johnny Dunlap    MR#:  [id number]  DATE OF BIRTH:  Mar 17, 1950  DATE OF ADMISSION:  08/01/2021  PRIMARY CARE PHYSICIAN: Pcp, No   REQUESTING/REFERRING PHYSICIAN: Dr. Artis Delay  CHIEF COMPLAINT:   Chief Complaint  Patient presents with  . generalized edema    Edema to LUE, BLE  . Homeless  . Abdominal Pain    Abdominal tenderness x years    HISTORY OF PRESENT ILLNESS:  Izaac Reisig  is a 71 y.o. male with a known history of liver cirrhosis and COPD and recent COVID-19 infection treated with 3 days of remdesivir presents back to the hospital with left arm swelling and pain.  He states he can hardly put his coat on secondary to pain and can hardly move his left arm.  He has had a cough since his diagnosis of COVID infection.  No fever chills or sweats.  Patient does homeless and lives wherever.  Hospitalist services were contacted for further evaluation.  Lactic acid level 4.2.  White blood cell count higher than previous hospitalization where he had pancytopenia.  PAST MEDICAL HISTORY:   Past Medical History:  Diagnosis Date  . Cirrhosis (HCC)   . COPD (chronic obstructive pulmonary disease) (HCC)     PAST SURGICAL HISTORY:   Past Surgical History:  Procedure Laterality Date  . MOUTH SURGERY    . NO PAST SURGERIES      SOCIAL HISTORY:   Social History   Tobacco Use  . Smoking status: Some Days    Types: Cigarettes  . Smokeless tobacco: Never  Substance Use Topics  . Alcohol use: Not Currently    FAMILY HISTORY:   Family History  Problem Relation Age of Onset  . CAD Father   . CAD Sister     DRUG ALLERGIES:  No Known Allergies  REVIEW OF SYSTEMS:  CONSTITUTIONAL: No fever, fatigue.  Positive for left arm weakness.  EYES: No blurred or double vision.  EARS, NOSE, AND THROAT: No tinnitus or ear pain. No sore throat RESPIRATORY: Some cough, occasional shortness of breath, no wheezing  or hemoptysis.  CARDIOVASCULAR: No chest pain.   GASTROINTESTINAL: No nausea, vomiting, diarrhea or abdominal pain. No blood in bowel movements GENITOURINARY: No dysuria, hematuria.  ENDOCRINE: No polyuria, nocturia,  HEMATOLOGY: No anemia, easy bruising or bleeding SKIN: No rash or lesion. MUSCULOSKELETAL: Positive for left arm pain, bilateral leg pains NEUROLOGIC: Positive weakness bilateral arms.  PSYCHIATRY: No anxiety or depression.   MEDICATIONS AT HOME:   Prior to Admission medications   Not on File   Patient takes no medications  VITAL SIGNS:  Blood pressure (!) 110/56, pulse 75, temperature 99.9 F (37.7 C), temperature source Oral, resp. rate (!) 21, height 6' (1.829 m), weight 67.1 kg, SpO2 98 %.  PHYSICAL EXAMINATION:  GENERAL:  71 y.o.-year-old patient lying in the bed with no acute distress.  EYES: Pupils equal, round, reactive to light and accommodation.  Positive scleral icterus. Extraocular muscles intact.  HEENT: Head atraumatic, normocephalic. Oropharynx and nasopharynx clear.  LUNGS: Decreased breath sounds bilateral bases, no wheezing, rales,rhonchi or crepitation. No use of accessory muscles of respiration.  CARDIOVASCULAR: S1, S2 normal. No murmurs, rubs, or gallops.  ABDOMEN: Soft, nontender, nondistended. Bowel sounds present. No organomegaly or mass.  EXTREMITIES: 2+ pedal edema.  Left arm difficulty moving secondary to swelling and pain NEUROLOGIC: Cranial nerves II through XII are intact.  Left arm difficulty moving secondary  to swelling and pain PSYCHIATRIC: The patient is alert and oriented x 3.  SKIN: Diffuse erythema left arm from hand up to bicep area some weeping edema.  Some pinkish discoloration bilateral lower extremities.  Right lower extremity does have a scab down there with some reddish area around it.  Slight yellowish tint to skin.  LABORATORY PANEL:   CBC Recent Labs  Lab 08/01/21 1503  WBC 9.4  HGB 8.6*  HCT 26.6*  PLT 45*    ------------------------------------------------------------------------------------------------------------------  Chemistries  Recent Labs  Lab 08/01/21 1503  NA 136  K 3.3*  CL 108  CO2 19*  GLUCOSE 107*  BUN 16  CREATININE 1.02  CALCIUM 7.8*  AST 63*  ALT 25  ALKPHOS 107  BILITOT 8.7*   ------------------------------------------------------------------------------------------------------------------    RADIOLOGY:  DG Elbow Complete Left  Result Date: 08/01/2021 CLINICAL DATA:  Left arm infection, pain EXAM: LEFT ELBOW - COMPLETE 3+ VIEW; LEFT WRIST - COMPLETE 3+ VIEW COMPARISON:  None. FINDINGS: Left elbow: Frontal, bilateral oblique, lateral views are obtained. No acute fracture, subluxation, or dislocation. Joint spaces are well preserved. No joint effusion. Severe soft tissue swelling throughout the visualized left elbow. No radiopaque foreign body. Left wrist: Frontal, oblique, lateral views demonstrate no fracture, subluxation, or dislocation. Joint spaces are well preserved. Diffuse soft tissue edema, greatest throughout the dorsum of the wrist and visualized hand. No radiopaque foreign body. IMPRESSION: 1. Diffuse soft tissue swelling of the visualized portions of the left wrist and elbow. No radiopaque foreign body or subcutaneous gas. 2. No acute bony abnormalities. Electronically Signed   By: Sharlet Salina M.D.   On: 08/01/2021 16:05   DG Wrist Complete Left  Result Date: 08/01/2021 CLINICAL DATA:  Left arm infection, pain EXAM: LEFT ELBOW - COMPLETE 3+ VIEW; LEFT WRIST - COMPLETE 3+ VIEW COMPARISON:  None. FINDINGS: Left elbow: Frontal, bilateral oblique, lateral views are obtained. No acute fracture, subluxation, or dislocation. Joint spaces are well preserved. No joint effusion. Severe soft tissue swelling throughout the visualized left elbow. No radiopaque foreign body. Left wrist: Frontal, oblique, lateral views demonstrate no fracture, subluxation, or dislocation.  Joint spaces are well preserved. Diffuse soft tissue edema, greatest throughout the dorsum of the wrist and visualized hand. No radiopaque foreign body. IMPRESSION: 1. Diffuse soft tissue swelling of the visualized portions of the left wrist and elbow. No radiopaque foreign body or subcutaneous gas. 2. No acute bony abnormalities. Electronically Signed   By: Sharlet Salina M.D.   On: 08/01/2021 16:05   US Venous Img Upper Uni Left  Result Date: 08/01/2021 CLINICAL DATA:  Left upper extremity pain and swelling for 2 weeks EXAM: LEFT UPPER EXTREMITY VENOUS DOPPLER ULTRASOUND TECHNIQUE: Gray-scale sonography with graded compression, as well as color Doppler and duplex ultrasound were performed to evaluate the upper extremity deep venous system from the level of the subclavian vein and including the jugular, axillary, basilic, radial, ulnar and upper cephalic vein. Spectral Doppler was utilized to evaluate flow at rest and with distal augmentation maneuvers. COMPARISON:  None. FINDINGS: Contralateral Subclavian Vein: Respiratory phasicity is normal and symmetric with the symptomatic side. No evidence of thrombus. Normal compressibility. Internal Jugular Vein: No evidence of thrombus. Normal compressibility, respiratory phasicity and response to augmentation. Subclavian Vein: No evidence of thrombus. Normal compressibility, respiratory phasicity and response to augmentation. Axillary Vein: No evidence of thrombus. Normal compressibility, respiratory phasicity and response to augmentation. Cephalic Vein: No evidence of thrombus. Normal compressibility, respiratory phasicity and response to augmentation. Basilic  Vein: No evidence of thrombus. Normal compressibility, respiratory phasicity and response to augmentation. Brachial Veins: No evidence of thrombus. Normal compressibility, respiratory phasicity and response to augmentation. Radial Veins: No evidence of thrombus. Normal compressibility, respiratory phasicity and  response to augmentation. Ulnar Veins: No evidence of thrombus. Normal compressibility, respiratory phasicity and response to augmentation. Venous Reflux:  None visualized. Other Findings:  Soft tissue edema. IMPRESSION: 1. Negative examination for deep venous thrombosis within the left upper extremity. 2.  Diffuse soft tissue edema. Electronically Signed   By: Lauralyn Primes M.D.   On: 08/01/2021 16:41   DG Chest Port 1 View  Result Date: 08/01/2021 CLINICAL DATA:  Sepsis. EXAM: PORTABLE CHEST 1 VIEW COMPARISON:  CT chest dated July 23, 2021 FINDINGS: Cardiac and mediastinal contours within normal limits. Small right pleural effusion with associated atelectasis. Background emphysematous changes. No evidence of pneumothorax. IMPRESSION: Small right pleural effusion with associated atelectasis, findings appear unchanged compared to prior CT. Electronically Signed   By: Allegra Lai MD   On: 08/01/2021 15:29    EKG:   Possible atrial fibrillation hard to tell with Foley interference on there but he heart rate is irregular.  IMPRESSION AND PLAN:   1.  Severe left arm cellulitis.  Patient complaining of left arm weakness secondary to swelling and pain.  Elevated white blood cell count 9.6 compared to his normal 3.3.  ER physician ordered Rocephin and vancomycin.  We will switch antibiotics to Ancef.  Follow-up blood cultures.  Add on a CPK. 2.  Lactic acidosis secondary to infection.  Gentle IV fluid hydration given in ER.  We will stop fluids after the bolus. 3.  Liver cirrhosis, auto anticoagulation (INR 2.1), thrombocytopenia (platelets 45), esophageal varices seen on previous CT scan, anasarca/ascites secondary to portal hypertension, bilateral pleural effusions, elevated bilirubin and jaundice 4.  Recent COVID infection still in the 10-day isolation.  Keep in airborne isolation.  Received 3 days of remdesivir on the last hospital stay. 5.  Iron deficiency anemia.  Patient was given a transfusion on  the last hospital stay.  Hemoglobin 8.6 6.  Hypokalemia.  Replace potassium orally 7.  Made patient a DO NOT RESUSCITATE    All the records, laboratory and radiological data are reviewed and case discussed with ED provider. Management plans discussed with the patient and he is in agreement.  Patient needs inpatient criteria with diffuse left upper extremity cellulitis and elevated lactic acidosis with criteria for severe sepsis  CODE STATUS: DNR  TOTAL TIME TAKING CARE OF THIS PATIENT: 45 minutes.    Alford Highland M.D on 08/01/2021 at 6:12 PM  Between 7am to 6pm - Pager - [phone]  After 6pm call admission pager [phone]  [facility]  CC: Primary care physician; Pcp, No

## 2021-08-01 NOTE — ED Notes (Signed)
Informed RN bed assigned 

## 2021-08-02 ENCOUNTER — Inpatient Hospital Stay: Payer: Medicare (Managed Care)

## 2021-08-02 ENCOUNTER — Other Ambulatory Visit: Payer: Self-pay

## 2021-08-02 DIAGNOSIS — L03114 Cellulitis of left upper limb: Secondary | ICD-10-CM | POA: Diagnosis not present

## 2021-08-02 DIAGNOSIS — Z5900 Homelessness unspecified: Secondary | ICD-10-CM | POA: Diagnosis not present

## 2021-08-02 DIAGNOSIS — A419 Sepsis, unspecified organism: Principal | ICD-10-CM

## 2021-08-02 DIAGNOSIS — R6520 Severe sepsis without septic shock: Secondary | ICD-10-CM

## 2021-08-02 LAB — BLOOD CULTURE ID PANEL (REFLEXED) - BCID2

## 2021-08-02 LAB — BASIC METABOLIC PANEL
Anion gap: 5 (ref 5–15)
BUN: 15 mg/dL (ref 8–23)
CO2: 21 mmol/L — ABNORMAL LOW (ref 22–32)
Calcium: 7.8 mg/dL — ABNORMAL LOW (ref 8.9–10.3)
Chloride: 110 mmol/L (ref 98–111)
Creatinine, Ser: 0.83 mg/dL (ref 0.61–1.24)
GFR, Estimated: >60 mL/min (ref >60)
Glucose, Bld: 94 mg/dL (ref 70–99)
Potassium: 3.1 mmol/L — ABNORMAL LOW (ref 3.5–5.1)
Sodium: 136 mmol/L (ref 135–145)

## 2021-08-02 LAB — LACTIC ACID, PLASMA: Lactic Acid, Venous: 1.8 mmol/L (ref 0.5–1.9)

## 2021-08-02 LAB — CBC
HCT: 24.5 % — ABNORMAL LOW (ref 39.0–52.0)
Hemoglobin: 7.9 g/dL — ABNORMAL LOW (ref 13.0–17.0)
MCH: 28.4 pg (ref 26.0–34.0)
MCHC: 32.2 g/dL (ref 30.0–36.0)
MCV: 88.1 fL (ref 80.0–100.0)
Platelets: 40 10*3/uL — ABNORMAL LOW (ref 150–400)
RBC: 2.78 MIL/uL — ABNORMAL LOW (ref 4.22–5.81)
RDW: 24.1 % — ABNORMAL HIGH (ref 11.5–15.5)
WBC: 6.1 10*3/uL (ref 4.0–10.5)
nRBC: 0 % (ref 0.0–0.2)

## 2021-08-02 LAB — PROCALCITONIN: Procalcitonin: 1.14 ng/mL

## 2021-08-02 LAB — C-REACTIVE PROTEIN: CRP: 6 mg/dL — ABNORMAL HIGH (ref <1.0)

## 2021-08-02 MED ORDER — ADULT MULTIVITAMIN W/MINERALS CH
1.0000 | ORAL_TABLET | Freq: Every day | ORAL | Status: DC
  Administered 2021-08-02 – 2021-08-15 (×12): 1 via ORAL
  Filled 2021-08-02 (×13): qty 1

## 2021-08-02 MED ORDER — POTASSIUM CHLORIDE CRYS ER 20 MEQ PO TBCR
40.0000 meq | EXTENDED_RELEASE_TABLET | Freq: Once | ORAL | Status: AC
  Administered 2021-08-02: 40 meq via ORAL
  Filled 2021-08-02: qty 2

## 2021-08-02 MED ORDER — ENSURE ENLIVE PO LIQD
237.0000 mL | ORAL | Status: DC
  Administered 2021-08-02 – 2021-08-10 (×9): 237 mL via ORAL

## 2021-08-02 NOTE — Progress Notes (Signed)
Initial Nutrition Assessment  DOCUMENTATION CODES:  Not applicable  INTERVENTION:  Continue current diet as ordered, encourage PO intake. Ensure Enlive po 1x/d, each supplement provides 350 kcal and 20 grams of protein MVI with minerals daily  NUTRITION DIAGNOSIS:  Increased nutrient needs related to acute illness (sepsis) as evidenced by estimated needs.  GOAL:  Patient will meet greater than or equal to 90% of their needs  MONITOR:  PO intake, Supplement acceptance  REASON FOR ASSESSMENT:  Malnutrition Screening Tool    ASSESSMENT:  71 year old male with past medical history of COPD and cirrhosis presented to the ED with left arm pain and swelling worsening for the last few weeks. Recently found to be COVID19 positive.  Work-up in ED consistent with sepsis due to left arm cellulitis. Lactic acid elevated, resolved with gentle hydration. Pt was started on IV antibiotics.   Unable to reach pt on room phone at this time. So far, intake this admission has been excellent with 1 recorded meal 100% consumed. Reviewed weight hx and records are limited. Based on limited documentation, it appears pt has gained ~2.5kg since ED visit last month. Some edema present on RN exam, could be the cause of gain.  Despite good intake and weight gain, pt's BMI is low for age >71, <23 is undesirable for this population. Pt is also homeless which places him at risk for food insecurity and malnutrition. Would favor most liberalized diet order possible and will add nutrition supplement 1x/d to encourage weight stability and adequate nutrition.   Average Meal Intake: 8/2-8/3: 100% intake x 1 recorded meals  Nutritionally Relevant Medications: Continuous Infusions:   ceFAZolin (ANCEF) IV     PRN Meds: ondansetron  Labs Reviewed: K 3.1  NUTRITION - FOCUSED PHYSICAL EXAM: Defer to in-person assessment  Diet Order:   Diet Order             Diet regular Room service appropriate? Yes; Fluid  consistency: Thin  Diet effective now                   EDUCATION NEEDS:  No education needs have been identified at this time  Skin:  Skin Assessment: Reviewed RN Assessment  Last BM:  8/2 - type 4  Height:  Ht Readings from Last 1 Encounters:  08/01/21 6' (1.829 m)    Weight:  Wt Readings from Last 1 Encounters:  08/01/21 67.1 kg    Ideal Body Weight:  80.9 kg  BMI:  Body mass index is 20.07 kg/m.  Estimated Nutritional Needs:  Kcal:  1800-2000 kcal/d Protein:  90-100 g/d Fluid:  2L/d   Greig Castilla, RD, LDN Clinical Dietitian Pager on Amion

## 2021-08-02 NOTE — Evaluation (Signed)
Physical Therapy Evaluation Patient Details Name: Johnny Dunlap MRN: [id number] DOB: 1950/11/19 Today's Date: 08/02/2021   History of Present Illness  71 y.o. M here with GI bleed a few weeks ago, now with progessive pain and swelling affecting his left arm, admitted with sepsis/cellulitis.  He states that it is very painful for him to move the arm and that it has looked very red recently.  He has not sure if he has had a fever and he denies any recent trauma to the arm.  Pt brought in by EMS who apparently found him outside of homeless shelter covered in stool and dirt. PMH of COPD, cirrhosis.  Clinical Impression  Pt was able to do some limited activity with PT but clearly he was uncomfortable and did not feel like doing much.  Pt hypersensitive with any L UE movement and could not functionally use it t/o the session.  He did show good R UE strength and decent LE strength.  PT encouraged him to try standing at EOB or possibly getting to recliner, but pt did not wish to.  Tough situation for d/c 2/2 pt being homeless and not having any help.  Regardless he will need STR and higher level of assist when ready for d/c.      Follow Up Recommendations SNF;Supervision/Assistance - 24 hour    Equipment Recommendations   (TBD at next venue of care, pt asks about getting a motorized w/c, which would be functionally very helpful)    Recommendations for Other Services       Precautions / Restrictions Precautions Precautions: Fall Restrictions Weight Bearing Restrictions: No      Mobility  Bed Mobility Overal bed mobility: Needs Assistance Bed Mobility: Supine to Sit;Sit to Supine     Supine to sit: Min guard Sit to supine: Min guard   General bed mobility comments: Pt able to lift trunk to sitting w/o L UE, safe and reasonably confident    Transfers                 General transfer comment: Pt deferred, too much pain in L UE and unable to use it on walker to attempt  standing  Ambulation/Gait                Stairs            Wheelchair Mobility    Modified Rankin (Stroke Patients Only)       Balance Overall balance assessment: Needs assistance Sitting-balance support: Single extremity supported Sitting balance-Leahy Scale: Fair         Standing balance comment: deferred standing per pt comfort level                             Pertinent Vitals/Pain Pain Assessment: 0-10 Pain Score: 8  Pain Location: L UE very swollen and tender also c/o b/l LE neuropathy pain    Home Living Family/patient expects to be discharged to:: Shelter/Homeless                 Additional Comments: Pt reports he has been homeless ~2 months, reports no friends/family to assist    Prior Function Level of Independence: Needs assistance   Gait / Transfers Assistance Needed: apparently can use w/c as walker and go 15-76ft, reports unable to be on feet longer than this  ADL's / Homemaking Assistance Needed: Pt has been struggling on the street for the last month+  Hand Dominance   Dominant Hand: Right    Extremity/Trunk Assessment   Upper Extremity Assessment Upper Extremity Assessment: Generalized weakness (R UE grossly 4/5, L very swollen and pain limited)    Lower Extremity Assessment Lower Extremity Assessment: Generalized weakness       Communication   Communication: No difficulties  Cognition Arousal/Alertness: Awake/alert Behavior During Therapy: WFL for tasks assessed/performed Overall Cognitive Status: Within Functional Limits for tasks assessed                                 General Comments: AOx4      General Comments General comments (skin integrity, edema, etc.): Pt generally feeling poorly, clearly frustrated by his situation, and having a lot of pain in L UE    Exercises     Assessment/Plan    PT Assessment Patient needs continued PT services  PT Problem List Decreased  strength;Decreased range of motion;Decreased activity tolerance;Decreased balance;Decreased mobility;Decreased knowledge of use of DME;Decreased safety awareness;Pain       PT Treatment Interventions Gait training;Functional mobility training;Therapeutic activities;Therapeutic exercise;Balance training;Neuromuscular re-education;Wheelchair mobility training;Patient/family education    PT Goals (Current goals can be found in the Care Plan section)  Acute Rehab PT Goals Patient Stated Goal: find a place to live PT Goal Formulation: With patient Time For Goal Achievement: 08/16/21 Potential to Achieve Goals: Fair    Frequency Min 2X/week   Barriers to discharge        Co-evaluation               AM-PAC PT "6 Clicks" Mobility  Outcome Measure Help needed turning from your back to your side while in a flat bed without using bedrails?: A Little Help needed moving from lying on your back to sitting on the side of a flat bed without using bedrails?: A Little Help needed moving to and from a bed to a chair (including a wheelchair)?: A Lot Help needed standing up from a chair using your arms (e.g., wheelchair or bedside chair)?: A Lot Help needed to walk in hospital room?: Total Help needed climbing 3-5 steps with a railing? : Total 6 Click Score: 12    End of Session   Activity Tolerance: Patient limited by pain Patient left: with call bell/phone within reach;with bed alarm set Nurse Communication: Mobility status PT Visit Diagnosis: Other abnormalities of gait and mobility (R26.89);Muscle weakness (generalized) (M62.81)    Time: 1448-1856 PT Time Calculation (min) (ACUTE ONLY): 28 min   Charges:   PT Evaluation $PT Eval Low Complexity: 1 Low          Malachi Pro, DPT 08/02/2021, 2:01 PM

## 2021-08-02 NOTE — Progress Notes (Signed)
PHARMACY - PHYSICIAN COMMUNICATION CRITICAL VALUE ALERT - BLOOD CULTURE IDENTIFICATION (BCID)  Johnny Dunlap is an 71 y.o. male who presented to [facility] on 08/01/2021 with a chief complaint of arm pain and swelling.  Negative for DVT  Assessment:  8/2 blood cx with GPC in 1 of 4 bottles, BCID = Staphylococcus species, not S aureus or epidermidis  Name of physician (or Provider) Contacted: Dr Jerral Ralph  Current antibiotics: Cefazolin for cellulitis  Changes to prescribed antibiotics recommended:  Patient is on recommended antibiotics - No changes needed.  Monitor on current antibiotics  Results for orders placed or performed during the hospital encounter of 08/01/21  Blood Culture ID Panel (Reflexed) (Collected: 08/01/2021  3:03 PM)  Result Value Ref Range   Enterococcus faecalis NOT DETECTED NOT DETECTED   Enterococcus Faecium NOT DETECTED NOT DETECTED   Listeria monocytogenes NOT DETECTED NOT DETECTED   Staphylococcus species DETECTED (A) NOT DETECTED   Staphylococcus aureus (BCID) NOT DETECTED NOT DETECTED   Staphylococcus epidermidis NOT DETECTED NOT DETECTED   Staphylococcus lugdunensis NOT DETECTED NOT DETECTED   Streptococcus species NOT DETECTED NOT DETECTED   Streptococcus agalactiae NOT DETECTED NOT DETECTED   Streptococcus pneumoniae NOT DETECTED NOT DETECTED   Streptococcus pyogenes NOT DETECTED NOT DETECTED   A.calcoaceticus-baumannii NOT DETECTED NOT DETECTED   Bacteroides fragilis NOT DETECTED NOT DETECTED   Enterobacterales NOT DETECTED NOT DETECTED   Enterobacter cloacae complex NOT DETECTED NOT DETECTED   Escherichia coli NOT DETECTED NOT DETECTED   Klebsiella aerogenes NOT DETECTED NOT DETECTED   Klebsiella oxytoca NOT DETECTED NOT DETECTED   Klebsiella pneumoniae NOT DETECTED NOT DETECTED   Proteus species NOT DETECTED NOT DETECTED   Salmonella species NOT DETECTED NOT DETECTED   Serratia marcescens NOT DETECTED NOT DETECTED   Haemophilus influenzae NOT  DETECTED NOT DETECTED   Neisseria meningitidis NOT DETECTED NOT DETECTED   Pseudomonas aeruginosa NOT DETECTED NOT DETECTED   Stenotrophomonas maltophilia NOT DETECTED NOT DETECTED   Candida albicans NOT DETECTED NOT DETECTED   Candida auris NOT DETECTED NOT DETECTED   Candida glabrata NOT DETECTED NOT DETECTED   Candida krusei NOT DETECTED NOT DETECTED   Candida parapsilosis NOT DETECTED NOT DETECTED   Candida tropicalis NOT DETECTED NOT DETECTED   Cryptococcus neoformans/gattii NOT DETECTED NOT DETECTED   [name], PharmD, BCPS.   Work Cell: [phone] 08/02/2021 9:02 AM

## 2021-08-02 NOTE — Progress Notes (Signed)
PROGRESS NOTE        PATIENT DETAILS Name: Johnny Dunlap Age: 71 y.o. Sex: male Date of Birth: 11-05-1950 Admit Date: 08/01/2021 Admitting Physician Alford Highland, MD PCP:Pcp, No  Brief Narrative: Patient is a 71 y.o. male with history of liver cirrhosis, COPD, recent COVID 19 infection-presenting with left forearm cellulitis.  Significant events: 8/2>> admit for left forearm soft tissue infection  Significant studies: 8/2>> x-ray chest: No pneumonia-small right pleural effusion 8/2>> x-ray left elbow/left wrist: Diffuse soft tissue swelling 8/3>> x-ray left forearm: Diffuse soft tissue swelling  Antimicrobial therapy: Ancef: 8/3>>  Microbiology data: 8/2>> blood culture 1/2: Gram-positive cocci  Procedures : None  Consults: Orthopedics  DVT Prophylaxis : SCDs Start: 08/01/21 1807 Place TED hose Start: 08/01/21 1807   Subjective: Left arm/forearm pain appears unchanged per patient-swelling appears unchanged per patient.  Assessment/Plan: Sepsis due to left forearm cellulitis: Sepsis physiology has resolved-however continues to have significant pain/swelling and induration of left forearm area-continue IV Ancef-blood cultures above-probably contamination.  Discussed with orthopedics-Dr. Patel-obtaining MRI to rule out deep tissue collection.  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  Recent COVID-19 infection: Currently asymptomatic-probably can come off isolation as of 8/4  Hypokalemia: replete and recheck  Homelessness: will need CM/SW involvement prior to discharge.   Diet: Diet Order             Diet regular Room service appropriate? Yes; Fluid consistency: Thin  Diet effective now                    Code Status: Full code or DNR  Family Communication: None at bedside  Disposition Plan: Status is: Inpatient  Remains inpatient appropriate  because:Inpatient level of care appropriate due to severity of illness  Dispo: The patient is from: Home              Anticipated d/c is to: Home              Patient currently is not medically stable to d/c.   Difficult to place patient No   Barriers to Discharge: Left forearm soft tissue infection on IV antibiotics.  Antimicrobial agents: Anti-infectives (From admission, onward)    Start     Dose/Rate Route Frequency Ordered Stop   08/02/21 1600  ceFAZolin (ANCEF) IVPB 2g/100 mL premix        2 g 200 mL/hr over 30 Minutes Intravenous Every 8 hours 08/01/21 1820     08/01/21 2200  ceFAZolin (ANCEF) IVPB 2g/100 mL premix  Status:  Discontinued        2 g 200 mL/hr over 30 Minutes Intravenous Every 8 hours 08/01/21 1804 08/01/21 1820   08/01/21 1500  vancomycin (VANCOCIN) IVPB 1000 mg/200 mL premix        1,000 mg 200 mL/hr over 60 Minutes Intravenous  Once 08/01/21 1449 08/01/21 1730   08/01/21 1500  cefTRIAXone (ROCEPHIN) 2 g in sodium chloride 0.9 % 100 mL IVPB        2 g 200 mL/hr over 30 Minutes Intravenous  Once 08/01/21 1449 08/01/21 1613        Time spent: 35 minutes-Greater than 50% of this time was spent in counseling, explanation of diagnosis, planning of further management, and coordination of care.  MEDICATIONS: Scheduled Meds:  feeding supplement  237 mL Oral Q24H   multivitamin with minerals  1 tablet Oral Daily   potassium chloride  40 mEq Oral Once   Continuous Infusions:   ceFAZolin (ANCEF) IV     PRN Meds:.acetaminophen **OR** acetaminophen, ondansetron **OR** ondansetron (ZOFRAN) IV, oxyCODONE   PHYSICAL EXAM: Vital signs: Vitals:   08/01/21 2304 08/02/21 0518 08/02/21 0736 08/02/21 1133  BP: 105/61 105/62 94/61 (!) 104/55  Pulse: 80 83 64 72  Resp: 18 18 16 20   Temp: 98.1 F (36.7 C) 98.3 F (36.8 C) 98.2 F (36.8 C) 98.2 F (36.8 C)  TempSrc: Axillary Oral Oral Oral  SpO2: 98% 93% 100% 93%  Weight:      Height:       Filed Weights    08/01/21 1613  Weight: 67.1 kg   Body mass index is 20.07 kg/m.   Gen Exam:Alert awake-not in any distress HEENT:atraumatic, normocephalic Chest: B/L clear to auscultation anteriorly CVS:S1S2 regular Abdomen:soft non tender, slightly distended but nontense. Extremities: Left forearm/lower aspect of left arm-indurated-tender-erythematous-however no obvious fluctuation evident. Neurology: Non focal Skin: no rash  I have personally reviewed following labs and imaging studies  LABORATORY DATA: CBC: Recent Labs  Lab 08/01/21 1503 08/02/21 0506  WBC 9.4 6.1  NEUTROABS 7.8*  --   HGB 8.6* 7.9*  HCT 26.6* 24.5*  MCV 87.5 88.1  PLT 45* 40*    Basic Metabolic Panel: Recent Labs  Lab 08/01/21 1503 08/02/21 0506  NA 136 136  K 3.3* 3.1*  CL 108 110  CO2 19* 21*  GLUCOSE 107* 94  BUN 16 15  CREATININE 1.02 0.83  CALCIUM 7.8* 7.8*    GFR: Estimated Creatinine Clearance: 80.8 mL/min (by C-G formula based on SCr of 0.83 mg/dL).  Liver Function Tests: Recent Labs  Lab 08/01/21 1503  AST 63*  ALT 25  ALKPHOS 107  BILITOT 8.7*  PROT 5.8*  ALBUMIN 2.5*   No results for input(s): LIPASE, AMYLASE in the last 168 hours. No results for input(s): AMMONIA in the last 168 hours.  Coagulation Profile: Recent Labs  Lab 08/01/21 1503  INR 2.1*    Cardiac Enzymes: Recent Labs  Lab 08/01/21 1603  CKTOTAL 386    BNP (last 3 results) No results for input(s): PROBNP in the last 8760 hours.  Lipid Profile: No results for input(s): CHOL, HDL, LDLCALC, TRIG, CHOLHDL, LDLDIRECT in the last 72 hours.  Thyroid Function Tests: No results for input(s): TSH, T4TOTAL, FREET4, T3FREE, THYROIDAB in the last 72 hours.  Anemia Panel: No results for input(s): VITAMINB12, FOLATE, FERRITIN, TIBC, IRON, RETICCTPCT in the last 72 hours.  Urine analysis: No results found for: COLORURINE, APPEARANCEUR, LABSPEC, PHURINE, GLUCOSEU, HGBUR, BILIRUBINUR, KETONESUR, PROTEINUR,  UROBILINOGEN, NITRITE, LEUKOCYTESUR  Sepsis Labs: Lactic Acid, Venous    Component Value Date/Time   LATICACIDVEN 1.8 08/02/2021 0506    MICROBIOLOGY: Recent Results (from the past 240 hour(s))  Resp Panel by RT-PCR (Flu A&B, Covid) Nasopharyngeal Swab     Status: Abnormal   Collection Time: 08/01/21  3:03 PM   Specimen: Nasopharyngeal Swab; Nasopharyngeal(NP) swabs in vial transport medium  Result Value Ref Range Status   SARS Coronavirus 2 by RT PCR POSITIVE (A) NEGATIVE Final    Comment: RESULT CALLED TO, READ BACK BY AND VERIFIED WITH: KARA BRIDGES 08/01/21 1624 KLW (NOTE) SARS-CoV-2 target nucleic acids are DETECTED.  The SARS-CoV-2 RNA is generally detectable in upper respiratory specimens during the acute phase of infection. Positive results are indicative of the presence of the identified virus, but do not rule out bacterial infection or  co-infection with other pathogens not detected by the test. Clinical correlation with patient history and other diagnostic information is necessary to determine patient infection status. The expected result is Negative.  Fact Sheet for Patients: BloggerCourse.com  Fact Sheet for Healthcare Providers: SeriousBroker.it  This test is not yet approved or cleared by the [place] FDA and  has been authorized for detection and/or diagnosis of SARS-CoV-2 by FDA under an Emergency Use Authorization (EUA).  This EUA will remain in effect (meaning this test can be used ) for the duration of  the COVID-19 declaration under Section 564(b)(1) of the Act, 21 U.S.C. section 360bbb-3(b)(1), unless the authorization is terminated or revoked sooner.     Influenza A by PCR NEGATIVE NEGATIVE Final   Influenza B by PCR NEGATIVE NEGATIVE Final    Comment: (NOTE) The Xpert Xpress SARS-CoV-2/FLU/RSV plus assay is intended as an aid in the diagnosis of influenza from Nasopharyngeal swab specimens  and should not be used as a sole basis for treatment. Nasal washings and aspirates are unacceptable for Xpert Xpress SARS-CoV-2/FLU/RSV testing.  Fact Sheet for Patients: BloggerCourse.com  Fact Sheet for Healthcare Providers: SeriousBroker.it  This test is not yet approved or cleared by the [place] FDA and has been authorized for detection and/or diagnosis of SARS-CoV-2 by FDA under an Emergency Use Authorization (EUA). This EUA will remain in effect (meaning this test can be used) for the duration of the COVID-19 declaration under Section 564(b)(1) of the Act, 21 U.S.C. section 360bbb-3(b)(1), unless the authorization is terminated or revoked.  Performed at [facility], [address] [place], [place] [address]   Blood Culture (routine x 2)     Status: None (Preliminary result)   Collection Time: 08/01/21  3:03 PM   Specimen: BLOOD  Result Value Ref Range Status   Specimen Description   Final    BLOOD BLOOD RIGHT FOREARM Performed at [facility], [address], [place], [place] [address]    Special Requests   Final    BOTTLES DRAWN AEROBIC AND ANAEROBIC Blood Culture adequate volume Performed at [facility], [address] [place], [place] [address]    Culture  Setup Time   Final    GRAM POSITIVE COCCI ANAEROBIC BOTTLE ONLY CRITICAL RESULT CALLED TO, READ BACK BY AND VERIFIED WITH: DEVIN MITCHELL @0835  08/02/21 SCS Performed at [facility], [address], [place], [place]    Culture GRAM POSITIVE COCCI  Final   Report Status PENDING  Incomplete  Blood Culture (routine x 2)     Status: None (Preliminary result)   Collection Time: 08/01/21  3:03 PM   Specimen: BLOOD  Result Value Ref Range Status   Specimen Description BLOOD RIGHT ANTECUBITAL  Final   Special Requests   Final    BOTTLES DRAWN AEROBIC AND ANAEROBIC Blood Culture adequate volume   Culture   Final     NO GROWTH < 24 HOURS Performed at [facility], [address] [place], [place]    Report Status PENDING  Incomplete  Blood Culture ID Panel (Reflexed)     Status: Abnormal   Collection Time: 08/01/21  3:03 PM  Result Value Ref Range Status   Enterococcus faecalis NOT DETECTED NOT DETECTED Final   Enterococcus Faecium NOT DETECTED NOT DETECTED Final   Listeria monocytogenes NOT DETECTED NOT DETECTED Final   Staphylococcus species DETECTED (A) NOT DETECTED Final    Comment: CRITICAL RESULT CALLED TO, READ BACK BY  AND VERIFIED WITH: DEVIN MITCHELL @0835  08/02/21 SCS    Staphylococcus aureus (BCID) NOT DETECTED NOT DETECTED Final   Staphylococcus epidermidis NOT DETECTED NOT DETECTED Final   Staphylococcus lugdunensis NOT DETECTED NOT DETECTED Final   Streptococcus species NOT DETECTED NOT DETECTED Final   Streptococcus agalactiae NOT DETECTED NOT DETECTED Final   Streptococcus pneumoniae NOT DETECTED NOT DETECTED Final   Streptococcus pyogenes NOT DETECTED NOT DETECTED Final   A.calcoaceticus-baumannii NOT DETECTED NOT DETECTED Final   Bacteroides fragilis NOT DETECTED NOT DETECTED Final   Enterobacterales NOT DETECTED NOT DETECTED Final   Enterobacter cloacae complex NOT DETECTED NOT DETECTED Final   Escherichia coli NOT DETECTED NOT DETECTED Final   Klebsiella aerogenes NOT DETECTED NOT DETECTED Final   Klebsiella oxytoca NOT DETECTED NOT DETECTED Final   Klebsiella pneumoniae NOT DETECTED NOT DETECTED Final   Proteus species NOT DETECTED NOT DETECTED Final   Salmonella species NOT DETECTED NOT DETECTED Final   Serratia marcescens NOT DETECTED NOT DETECTED Final   Haemophilus influenzae NOT DETECTED NOT DETECTED Final   Neisseria meningitidis NOT DETECTED NOT DETECTED Final   Pseudomonas aeruginosa NOT DETECTED NOT DETECTED Final   Stenotrophomonas maltophilia NOT DETECTED NOT DETECTED Final   Candida albicans NOT DETECTED NOT DETECTED Final   Candida auris  NOT DETECTED NOT DETECTED Final   Candida glabrata NOT DETECTED NOT DETECTED Final   Candida krusei NOT DETECTED NOT DETECTED Final   Candida parapsilosis NOT DETECTED NOT DETECTED Final   Candida tropicalis NOT DETECTED NOT DETECTED Final   Cryptococcus neoformans/gattii NOT DETECTED NOT DETECTED Final    Comment: Performed at [facility], [address] [place], [place] [address]    RADIOLOGY STUDIES/RESULTS: DG Elbow Complete Left  Result Date: 08/01/2021 CLINICAL DATA:  Left arm infection, pain EXAM: LEFT ELBOW - COMPLETE 3+ VIEW; LEFT WRIST - COMPLETE 3+ VIEW COMPARISON:  None. FINDINGS: Left elbow: Frontal, bilateral oblique, lateral views are obtained. No acute fracture, subluxation, or dislocation. Joint spaces are well preserved. No joint effusion. Severe soft tissue swelling throughout the visualized left elbow. No radiopaque foreign body. Left wrist: Frontal, oblique, lateral views demonstrate no fracture, subluxation, or dislocation. Joint spaces are well preserved. Diffuse soft tissue edema, greatest throughout the dorsum of the wrist and visualized hand. No radiopaque foreign body. IMPRESSION: 1. Diffuse soft tissue swelling of the visualized portions of the left wrist and elbow. No radiopaque foreign body or subcutaneous gas. 2. No acute bony abnormalities. Electronically Signed   By: Sharlet SalinaMichael  Brown M.D.   On: 08/01/2021 16:05   DG Forearm Left  Result Date: 08/02/2021 CLINICAL DATA:  Edema and cellulitis. EXAM: LEFT FOREARM - 2 VIEW COMPARISON:  Prior studies 08/01/2021. FINDINGS: No acute bony or joint abnormality identified. No evidence of fracture or dislocation. Diffuse soft tissue swelling. Peripheral vascular calcification. IMPRESSION: 1. Diffuse soft tissue swelling. Peripheral vascular calcification. 2.  No acute bony abnormality. Electronically Signed   By: Maisie Fushomas  Register   On: 08/02/2021 12:29   DG Wrist Complete Left  Result Date: 08/01/2021 CLINICAL DATA:   Left arm infection, pain EXAM: LEFT ELBOW - COMPLETE 3+ VIEW; LEFT WRIST - COMPLETE 3+ VIEW COMPARISON:  None. FINDINGS: Left elbow: Frontal, bilateral oblique, lateral views are obtained. No acute fracture, subluxation, or dislocation. Joint spaces are well preserved. No joint effusion. Severe soft tissue swelling throughout the visualized left elbow. No radiopaque foreign body. Left wrist: Frontal, oblique, lateral views demonstrate no fracture, subluxation, or dislocation. Joint spaces are well preserved.  Diffuse soft tissue edema, greatest throughout the dorsum of the wrist and visualized hand. No radiopaque foreign body. IMPRESSION: 1. Diffuse soft tissue swelling of the visualized portions of the left wrist and elbow. No radiopaque foreign body or subcutaneous gas. 2. No acute bony abnormalities. Electronically Signed   By: Sharlet Salina M.D.   On: 08/01/2021 16:05   US Venous Img Upper Uni Left  Result Date: 08/01/2021 CLINICAL DATA:  Left upper extremity pain and swelling for 2 weeks EXAM: LEFT UPPER EXTREMITY VENOUS DOPPLER ULTRASOUND TECHNIQUE: Gray-scale sonography with graded compression, as well as color Doppler and duplex ultrasound were performed to evaluate the upper extremity deep venous system from the level of the subclavian vein and including the jugular, axillary, basilic, radial, ulnar and upper cephalic vein. Spectral Doppler was utilized to evaluate flow at rest and with distal augmentation maneuvers. COMPARISON:  None. FINDINGS: Contralateral Subclavian Vein: Respiratory phasicity is normal and symmetric with the symptomatic side. No evidence of thrombus. Normal compressibility. Internal Jugular Vein: No evidence of thrombus. Normal compressibility, respiratory phasicity and response to augmentation. Subclavian Vein: No evidence of thrombus. Normal compressibility, respiratory phasicity and response to augmentation. Axillary Vein: No evidence of thrombus. Normal compressibility,  respiratory phasicity and response to augmentation. Cephalic Vein: No evidence of thrombus. Normal compressibility, respiratory phasicity and response to augmentation. Basilic Vein: No evidence of thrombus. Normal compressibility, respiratory phasicity and response to augmentation. Brachial Veins: No evidence of thrombus. Normal compressibility, respiratory phasicity and response to augmentation. Radial Veins: No evidence of thrombus. Normal compressibility, respiratory phasicity and response to augmentation. Ulnar Veins: No evidence of thrombus. Normal compressibility, respiratory phasicity and response to augmentation. Venous Reflux:  None visualized. Other Findings:  Soft tissue edema. IMPRESSION: 1. Negative examination for deep venous thrombosis within the left upper extremity. 2.  Diffuse soft tissue edema. Electronically Signed   By: Lauralyn Primes M.D.   On: 08/01/2021 16:41   DG Chest Port 1 View  Result Date: 08/01/2021 CLINICAL DATA:  Sepsis. EXAM: PORTABLE CHEST 1 VIEW COMPARISON:  CT chest dated July 23, 2021 FINDINGS: Cardiac and mediastinal contours within normal limits. Small right pleural effusion with associated atelectasis. Background emphysematous changes. No evidence of pneumothorax. IMPRESSION: Small right pleural effusion with associated atelectasis, findings appear unchanged compared to prior CT. Electronically Signed   By: Allegra Lai MD   On: 08/01/2021 15:29     LOS: 1 day   Jeoffrey Massed, MD  [facility]    To contact the attending provider between 7A-7P or the covering provider during after hours 7P-7A, please log into the web site [email or web address] and access using universal [place] password for that web site. If you do not have the password, please call the hospital operator.  08/02/2021, 3:20 PM

## 2021-08-02 NOTE — Consult Note (Signed)
ORTHOPAEDIC CONSULTATION  REQUESTING PHYSICIAN: Maretta Bees, MD  Chief Complaint:   LUE infection  History of Present Illness: Johnny Dunlap is a 71 y.o. male who is right-hand dominant that was admitted yesterday for a left Upper extremity forearm infection.  Of note, the patient has a medical history significant for cirrhosis and COPD.  He was also diagnosed with COVID 10 days ago and treated with remdesivir.  Of note, he is also homeless.  He states he has had pain and swelling about the left upper extremity for a few weeks, but noticed significantly increased redness and pain with swelling over the past few days.  He was started on broad-spectrum antibiotics for presumed cellulitis.  He states he has significant pain with attempted motion of the arm.  He cannot think of any possible source of infection.  Past Medical History:  Diagnosis Date   Cirrhosis (HCC)    COPD (chronic obstructive pulmonary disease) (HCC)    Past Surgical History:  Procedure Laterality Date   MOUTH SURGERY     NO PAST SURGERIES     Social History   Socioeconomic History   Marital status: Legally Separated    Spouse name: Not on file   Number of children: Not on file   Years of education: Not on file   Highest education level: Not on file  Occupational History   Not on file  Tobacco Use   Smoking status: Some Days    Types: Cigarettes   Smokeless tobacco: Never  Substance and Sexual Activity   Alcohol use: Not Currently   Drug use: Never   Sexual activity: Not on file  Other Topics Concern   Not on file  Social History Narrative   Not on file   Social Determinants of Health   Financial Resource Strain: Not on file  Food Insecurity: Not on file  Transportation Needs: Not on file  Physical Activity: Not on file  Stress: Not on file  Social Connections: Not on file   Family History  Problem Relation Age of Onset   CAD  Father    CAD Sister    No Known Allergies Prior to Admission medications   Not on File   Recent Labs    08/01/21 1503 08/02/21 0506  WBC 9.4 6.1  HGB 8.6* 7.9*  HCT 26.6* 24.5*  PLT 45* 40*  K 3.3* 3.1*  CL 108 110  CO2 19* 21*  BUN 16 15  CREATININE 1.02 0.83  GLUCOSE 107* 94  CALCIUM 7.8* 7.8*  INR 2.1*  --    DG Elbow Complete Left  Result Date: 08/01/2021 CLINICAL DATA:  Left arm infection, pain EXAM: LEFT ELBOW - COMPLETE 3+ VIEW; LEFT WRIST - COMPLETE 3+ VIEW COMPARISON:  None. FINDINGS: Left elbow: Frontal, bilateral oblique, lateral views are obtained. No acute fracture, subluxation, or dislocation. Joint spaces are well preserved. No joint effusion. Severe soft tissue swelling throughout the visualized left elbow. No radiopaque foreign body. Left wrist: Frontal, oblique, lateral views demonstrate no fracture, subluxation, or dislocation. Joint spaces are well preserved. Diffuse soft tissue edema, greatest throughout the dorsum of the wrist and visualized hand. No radiopaque foreign body. IMPRESSION: 1. Diffuse soft tissue swelling of the visualized portions of the left wrist and elbow. No radiopaque foreign body or subcutaneous gas. 2. No acute bony abnormalities. Electronically Signed   By: Sharlet Salina M.D.   On: 08/01/2021 16:05   DG Forearm Left  Result Date: 08/02/2021 CLINICAL DATA:  Edema and  cellulitis. EXAM: LEFT FOREARM - 2 VIEW COMPARISON:  Prior studies 08/01/2021. FINDINGS: No acute bony or joint abnormality identified. No evidence of fracture or dislocation. Diffuse soft tissue swelling. Peripheral vascular calcification. IMPRESSION: 1. Diffuse soft tissue swelling. Peripheral vascular calcification. 2.  No acute bony abnormality. Electronically Signed   By: Maisie Fus  Register   On: 08/02/2021 12:29   DG Wrist Complete Left  Result Date: 08/01/2021 CLINICAL DATA:  Left arm infection, pain EXAM: LEFT ELBOW - COMPLETE 3+ VIEW; LEFT WRIST - COMPLETE 3+ VIEW  COMPARISON:  None. FINDINGS: Left elbow: Frontal, bilateral oblique, lateral views are obtained. No acute fracture, subluxation, or dislocation. Joint spaces are well preserved. No joint effusion. Severe soft tissue swelling throughout the visualized left elbow. No radiopaque foreign body. Left wrist: Frontal, oblique, lateral views demonstrate no fracture, subluxation, or dislocation. Joint spaces are well preserved. Diffuse soft tissue edema, greatest throughout the dorsum of the wrist and visualized hand. No radiopaque foreign body. IMPRESSION: 1. Diffuse soft tissue swelling of the visualized portions of the left wrist and elbow. No radiopaque foreign body or subcutaneous gas. 2. No acute bony abnormalities. Electronically Signed   By: Sharlet Salina M.D.   On: 08/01/2021 16:05   US Venous Img Upper Uni Left  Result Date: 08/01/2021 CLINICAL DATA:  Left upper extremity pain and swelling for 2 weeks EXAM: LEFT UPPER EXTREMITY VENOUS DOPPLER ULTRASOUND TECHNIQUE: Gray-scale sonography with graded compression, as well as color Doppler and duplex ultrasound were performed to evaluate the upper extremity deep venous system from the level of the subclavian vein and including the jugular, axillary, basilic, radial, ulnar and upper cephalic vein. Spectral Doppler was utilized to evaluate flow at rest and with distal augmentation maneuvers. COMPARISON:  None. FINDINGS: Contralateral Subclavian Vein: Respiratory phasicity is normal and symmetric with the symptomatic side. No evidence of thrombus. Normal compressibility. Internal Jugular Vein: No evidence of thrombus. Normal compressibility, respiratory phasicity and response to augmentation. Subclavian Vein: No evidence of thrombus. Normal compressibility, respiratory phasicity and response to augmentation. Axillary Vein: No evidence of thrombus. Normal compressibility, respiratory phasicity and response to augmentation. Cephalic Vein: No evidence of thrombus. Normal  compressibility, respiratory phasicity and response to augmentation. Basilic Vein: No evidence of thrombus. Normal compressibility, respiratory phasicity and response to augmentation. Brachial Veins: No evidence of thrombus. Normal compressibility, respiratory phasicity and response to augmentation. Radial Veins: No evidence of thrombus. Normal compressibility, respiratory phasicity and response to augmentation. Ulnar Veins: No evidence of thrombus. Normal compressibility, respiratory phasicity and response to augmentation. Venous Reflux:  None visualized. Other Findings:  Soft tissue edema. IMPRESSION: 1. Negative examination for deep venous thrombosis within the left upper extremity. 2.  Diffuse soft tissue edema. Electronically Signed   By: Lauralyn Primes M.D.   On: 08/01/2021 16:41   DG Chest Port 1 View  Result Date: 08/01/2021 CLINICAL DATA:  Sepsis. EXAM: PORTABLE CHEST 1 VIEW COMPARISON:  CT chest dated July 23, 2021 FINDINGS: Cardiac and mediastinal contours within normal limits. Small right pleural effusion with associated atelectasis. Background emphysematous changes. No evidence of pneumothorax. IMPRESSION: Small right pleural effusion with associated atelectasis, findings appear unchanged compared to prior CT. Electronically Signed   By: Allegra Lai MD   On: 08/01/2021 15:29     Positive ROS: All other systems have been reviewed and were otherwise negative with the exception of those mentioned in the HPI and as above.  Physical Exam: BP 112/61 (BP Location: Right Arm)   Pulse 77  Temp 98 F (36.7 C) (Oral)   Resp 20   Ht 6' (1.829 m)   Wt 67.1 kg   SpO2 99%   BMI 20.07 kg/m  General:  Alert, no acute distress Psychiatric:  Patient is competent for consent with normal mood and affect   Cardiovascular:  No pedal edema, regular rate and rhythm Respiratory:  No wheezing, non-labored breathing GI:  Abdomen is soft and non-tender Skin:  No lesions in the area of chief complaint, no  erythema Neurologic:  Sensation intact distally, CN grossly intact Lymphatic:  No axillary or cervical lymphadenopathy  Orthopedic Exam:  LUE: +ain/pin/u motor SILT r/u/m/ax +rad pulse Able to range shoulder, elbow, and wrist.  Patient able to attempt to make fist although cannot completely closed due to swelling.  There is significant swelling about the hand on the dorsal aspect diffusely.  There is also significant erythema and swelling extending almost the entire extent of the forearm and to the mid humerus level.  There does not appear to be a palpable fluid collection.  X-rays left forearm:  As above: No fractures or dislocations.  There is significant soft tissue swelling noted.  Assessment/Plan: 71 year old male with cirrhosis and recent COVID infection presenting with likely left forearm cellulitis. 1.  Continue IV antibiotics  2.  Recommend obtaining an MRI of the forearm and humerus to rule out any focal fluid collection suggestive of abscess that may be amenable to surgical drainage.  If there is only diffuse soft tissue swelling consistent with more of a cellulitis picture, would recommend further conservative management with IV antibiotic. 3.  We will update recommendations after MRI is obtained. 4.  Please keep n.p.o. after midnight    Signa Kell   08/02/2021 5:09 PM

## 2021-08-02 NOTE — Evaluation (Signed)
Occupational Therapy Evaluation Patient Details Name: Johnny Dunlap MRN: [id number] DOB: 31-Mar-1950 Today's Date: 08/02/2021    History of Present Illness 71 y.o. M here with GI bleed a few weeks ago, now with progessive pain and swelling affecting his left arm, admitted with sepsis/cellulitis.  He states that it is very painful for him to move the arm and that it has looked very red recently.  He has not sure if he has had a fever and he denies any recent trauma to the arm.  Pt brought in by EMS who apparently found him outside of homeless shelter covered in stool and dirt. PMH of COPD, cirrhosis.   Clinical Impression   Mr. Patmon was seen for OT evaluation this date. Prior to hospital admission, pt was homeless living in shelters intermittently. He reports using a WC for functional mobility, but states "the 4 little wheels don't roll on the streets" so states he has to stand and push WC over rough terrain. Pt reports he is independent for bathing and dressing when not acutely hospitalized. Currently pt demonstrates impairments as described below (See OT problem list) which functionally limit his ability to perform ADL/self-care tasks. Pt currently requires MOD A for LB ADL management, as well as supervision-MIN A for UB ADL Management in seated position.  Pt would benefit from skilled OT services to address noted impairments and functional limitations (see below for any additional details) in order to maximize safety and independence while minimizing falls risk and caregiver burden. Upon hospital discharge, recommend STR to maximize pt safety and return to PLOF.      Follow Up Recommendations  SNF;Supervision/Assistance - 24 hour    Equipment Recommendations   (TBD)    Recommendations for Other Services       Precautions / Restrictions Precautions Precautions: Fall Restrictions Weight Bearing Restrictions: No      Mobility Bed Mobility Overal bed mobility: Needs Assistance Bed  Mobility: Supine to Sit;Sit to Supine     Supine to sit: Min guard Sit to supine: Min guard   General bed mobility comments: Pt able to lift trunk to sitting w/o L UE, safe and reasonably confident    Transfers                 General transfer comment: Pt deferred, too much pain in L UE and unable to use it on walker to attempt standing    Balance Overall balance assessment: Needs assistance Sitting-balance support: Single extremity supported Sitting balance-Leahy Scale: Fair Sitting balance - Comments: limited 2/2 impaired functional use of LUE       Standing balance comment: deferred standing per pt comfort level                           ADL either performed or assessed with clinical judgement   ADL Overall ADL's : Needs assistance/impaired                                       General ADL Comments: Pt functionally limited by impaired functional use of his LUE(non-dominant), generalized weakness, and increased pain with activity. He requires set up/supervision for seated UB ADL management with MIN assist required for two handed tasks 2/2 LUE swelling/pain. Anticipate min-MOD A for LB ADL management 2/2 BLE pain/swelling limiting mobility.     Vision Baseline Vision/History: Cataracts Patient Visual Report: No change  from baseline       Perception     Praxis      Pertinent Vitals/Pain Pain Assessment: Faces Pain Score: 8  Faces Pain Scale: Hurts even more Pain Location: L UE very swollen and tender also c/o b/l LE neuropathy pain Pain Descriptors / Indicators: Sharp;Sore;Tender;Discomfort;Grimacing Pain Intervention(s): Limited activity within patient's tolerance;Monitored during session     Hand Dominance Right   Extremity/Trunk Assessment Upper Extremity Assessment Upper Extremity Assessment: Generalized weakness;LUE deficits/detail (LUE assessment limited 2/2 pain, LUE notably swollen/edematous. will continue to monitor and  assess function as pt progresses.) LUE: Unable to fully assess due to pain   Lower Extremity Assessment Lower Extremity Assessment: Generalized weakness;Defer to PT evaluation (Able to move BUE against gravity. assessment limited 2/2 pain.)       Communication Communication Communication: No difficulties   Cognition Arousal/Alertness: Awake/alert Behavior During Therapy: WFL for tasks assessed/performed Overall Cognitive Status: Within Functional Limits for tasks assessed                                 General Comments: A&O x4, agreeable to therapy but antalgic and self limiting for comfort.   General Comments  SO2 noted to dropp to mid 80's intermittenlty during session per room monitor. HR remains generally in the 90's. Pt on RA. SO2 improves with cues for PLB and pause in conversation. educated on energy conservation strategies.    Exercises Other Exercises Other Exercises: OT facilitates pt education in energy conservation strategies, falls prevention, and routines modifications to support safety and functional indep upon hospital DC.   Shoulder Instructions      Home Living Family/patient expects to be discharged to:: Shelter/Homeless                                 Additional Comments: Pt reports he has been homeless ~2 months, reports no friends/family to assist      Prior Functioning/Environment Level of Independence: Needs assistance  Gait / Transfers Assistance Needed: per pt report, can use w/c as walker and go 15-65ft, reports unable to be on feet longer than this ADL's / Homemaking Assistance Needed: Pt teports has been struggling to manage ADLs on the street for the last month+, states he primarily only bathes when he comes to the hospital. Otherwise independent for dressing, bathing, and BADL management at baseline.   Comments: Utilizes WC for community mobility, ambulates by holding onto surfaces for support        OT Problem  List: Decreased strength;Decreased coordination;Decreased range of motion;Decreased activity tolerance;Decreased safety awareness;Impaired balance (sitting and/or standing);Decreased knowledge of use of DME or AE      OT Treatment/Interventions: Self-care/ADL training;Therapeutic exercise;Balance training;Neuromuscular education;Therapeutic activities;DME and/or AE instruction;Energy conservation;Patient/family education    OT Goals(Current goals can be found in the care plan section) Acute Rehab OT Goals Patient Stated Goal: to get a motorized scooter OT Goal Formulation: With patient Time For Goal Achievement: 08/16/21 Potential to Achieve Goals: Good ADL Goals Pt Will Perform Grooming: with modified independence;sitting Pt Will Perform Lower Body Dressing: with adaptive equipment;with modified independence Pt Will Transfer to Toilet: bedside commode;stand pivot transfer;with modified independence Pt Will Perform Toileting - Clothing Manipulation and hygiene: sitting/lateral leans;with modified independence  OT Frequency: Min 2X/week   Barriers to D/C: Decreased caregiver support;Inaccessible home environment  Co-evaluation              AM-PAC OT "6 Clicks" Daily Activity     Outcome Measure Help from another person eating meals?: A Little Help from another person taking care of personal grooming?: A Little Help from another person toileting, which includes using toliet, bedpan, or urinal?: A Little Help from another person bathing (including washing, rinsing, drying)?: A Little Help from another person to put on and taking off regular upper body clothing?: A Little Help from another person to put on and taking off regular lower body clothing?: A Little 6 Click Score: 18   End of Session Nurse Communication: Mobility status  Activity Tolerance: Patient tolerated treatment well;No increased pain Patient left: in bed  OT Visit Diagnosis: Other abnormalities of gait  and mobility (R26.89)                Time: 0569-7948 OT Time Calculation (min): 27 min Charges:  OT General Charges $OT Visit: 1 Visit OT Evaluation $OT Eval Moderate Complexity: 1 Mod OT Treatments $Self Care/Home Management : 8-22 mins  Rockney Ghee, M.S., OTR/L Ascom: [phone] 08/02/21, 3:46 PM

## 2021-08-03 DIAGNOSIS — L03114 Cellulitis of left upper limb: Secondary | ICD-10-CM | POA: Diagnosis not present

## 2021-08-03 DIAGNOSIS — R6520 Severe sepsis without septic shock: Secondary | ICD-10-CM | POA: Diagnosis not present

## 2021-08-03 DIAGNOSIS — A419 Sepsis, unspecified organism: Secondary | ICD-10-CM | POA: Diagnosis not present

## 2021-08-03 DIAGNOSIS — Z5900 Homelessness unspecified: Secondary | ICD-10-CM | POA: Diagnosis not present

## 2021-08-03 LAB — COMPREHENSIVE METABOLIC PANEL
ALT: 19 U/L (ref 0–44)
AST: 44 U/L — ABNORMAL HIGH (ref 15–41)
Albumin: 1.8 g/dL — ABNORMAL LOW (ref 3.5–5.0)
Alkaline Phosphatase: 115 U/L (ref 38–126)
Anion gap: 5 (ref 5–15)
BUN: 15 mg/dL (ref 8–23)
CO2: 24 mmol/L (ref 22–32)
Calcium: 7.5 mg/dL — ABNORMAL LOW (ref 8.9–10.3)
Chloride: 108 mmol/L (ref 98–111)
Creatinine, Ser: 0.87 mg/dL (ref 0.61–1.24)
GFR, Estimated: >60 mL/min (ref >60)
Glucose, Bld: 96 mg/dL (ref 70–99)
Potassium: 3 mmol/L — ABNORMAL LOW (ref 3.5–5.1)
Sodium: 137 mmol/L (ref 135–145)
Total Bilirubin: 3.8 mg/dL — ABNORMAL HIGH (ref 0.3–1.2)
Total Protein: 4.8 g/dL — ABNORMAL LOW (ref 6.5–8.1)

## 2021-08-03 LAB — CBC
HCT: 23.2 % — ABNORMAL LOW (ref 39.0–52.0)
Hemoglobin: 7.5 g/dL — ABNORMAL LOW (ref 13.0–17.0)
MCH: 28.2 pg (ref 26.0–34.0)
MCHC: 32.3 g/dL (ref 30.0–36.0)
MCV: 87.2 fL (ref 80.0–100.0)
Platelets: 42 10*3/uL — ABNORMAL LOW (ref 150–400)
RBC: 2.66 MIL/uL — ABNORMAL LOW (ref 4.22–5.81)
RDW: 24 % — ABNORMAL HIGH (ref 11.5–15.5)
WBC: 5.1 10*3/uL (ref 4.0–10.5)
nRBC: 0 % (ref 0.0–0.2)

## 2021-08-03 LAB — PROCALCITONIN: Procalcitonin: 0.66 ng/mL

## 2021-08-03 MED ORDER — POTASSIUM CHLORIDE CRYS ER 20 MEQ PO TBCR
40.0000 meq | EXTENDED_RELEASE_TABLET | ORAL | Status: AC
  Administered 2021-08-03 (×2): 40 meq via ORAL
  Filled 2021-08-03 (×2): qty 2

## 2021-08-03 MED ORDER — SPIRONOLACTONE 25 MG PO TABS
25.0000 mg | ORAL_TABLET | Freq: Every day | ORAL | Status: DC
  Administered 2021-08-03: 25 mg via ORAL

## 2021-08-03 MED ORDER — MIDODRINE HCL 5 MG PO TABS
5.0000 mg | ORAL_TABLET | Freq: Three times a day (TID) | ORAL | Status: DC
  Administered 2021-08-03 – 2021-08-04 (×4): 5 mg via ORAL
  Filled 2021-08-03 (×4): qty 1

## 2021-08-03 NOTE — Progress Notes (Signed)
PROGRESS NOTE        PATIENT DETAILS Name: Kaleo Condrey Age: 71 y.o. Sex: male Date of Birth: [birth year] Admit Date: 08/01/2021 Admitting Physician Alford Highland, MD PCP:Pcp, No  Brief Narrative: Patient is a 71 y.o. male with history of liver cirrhosis, COPD, recent COVID 19 infection-presenting with left forearm cellulitis.  Significant events: 8/2>> admit for left forearm soft tissue infection  Significant studies: 8/2>> x-ray chest: No pneumonia-small right pleural effusion 8/2>> x-ray left elbow/left wrist: Diffuse soft tissue swelling 8/3>> x-ray left forearm: Diffuse soft tissue swelling  Antimicrobial therapy: Ancef: 8/3>>  Microbiology data: 8/2>> blood culture 1/2: Staph hominis  Procedures : None  Consults: Orthopedics  DVT Prophylaxis : SCDs Start: 08/01/21 1807 Place TED hose Start: 08/01/21 1807   Subjective: Improved swelling/pain in the left forearm/arm.  Assessment/Plan: Sepsis due to left forearm cellulitis: Sepsis physiology has resolved-left forearm clinically improved with less swelling/erythema and pain.  Continue IV Ancef-continue to elevate left upper extremity is much as possible.  Patient did not tolerate planned MRI yesterday due to claustrophobia-given clinical improvement-doubt further imaging is required.  Blood cultures-1/2 positive for staph hominis-highly suspicious for a contamination.    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  Recent COVID-19 infection: Currently asymptomatic-probably can come off isolation as of 8/4  Hypokalemia: Continue to replete and recheck-adding Aldactone that should help as  well.  Homelessness: will need CM/SW involvement prior to discharge.   Diet: Diet Order             Diet regular Room service appropriate? Yes; Fluid consistency: Thin  Diet effective now                    Code Status: Full code   Family Communication: None at bedside  Disposition Plan: Status is: Inpatient  Remains inpatient appropriate because:Inpatient level of care appropriate due to severity of illness  Dispo: The patient is from: Home              Anticipated d/c is to: Home              Patient currently is not medically stable to d/c.   Difficult to place patient No   Barriers to Discharge: Left forearm soft tissue infection on IV antibiotics.  Antimicrobial agents: Anti-infectives (From admission, onward)    Start     Dose/Rate Route Frequency Ordered Stop   08/02/21 1600  ceFAZolin (ANCEF) IVPB 2g/100 mL premix        2 g 200 mL/hr over 30 Minutes Intravenous Every 8 hours 08/01/21 1820     08/01/21 2200  ceFAZolin (ANCEF) IVPB 2g/100 mL premix  Status:  Discontinued        2 g 200 mL/hr over 30 Minutes Intravenous Every 8 hours 08/01/21 1804 08/01/21 1820   08/01/21 1500  vancomycin (VANCOCIN) IVPB 1000 mg/200 mL premix        1,000 mg 200 mL/hr over 60 Minutes Intravenous  Once 08/01/21 1449 08/01/21 1730   08/01/21 1500  cefTRIAXone (ROCEPHIN) 2 g in sodium chloride 0.9 % 100 mL IVPB  2 g 200 mL/hr over 30 Minutes Intravenous  Once 08/01/21 1449 08/01/21 1613        Time spent: 35 minutes-Greater than 50% of this time was spent in counseling, explanation of diagnosis, planning of further management, and coordination of care.  MEDICATIONS: Scheduled Meds:  feeding supplement  237 mL Oral Q24H   multivitamin with minerals  1 tablet Oral Daily   potassium chloride  40 mEq Oral Q4H   Continuous Infusions:   ceFAZolin (ANCEF) IV 2 g (08/03/21 1019)   PRN Meds:.acetaminophen **OR** acetaminophen, ondansetron **OR** ondansetron  (ZOFRAN) IV, oxyCODONE   PHYSICAL EXAM: Vital signs: Vitals:   08/03/21 0015 08/03/21 0609 08/03/21 0826 08/03/21 1215  BP: (!) 99/59 (!) 103/53 (!) 92/56 (!) 97/59  Pulse: 62 72 78 (!) 58  Resp: 18 18 18 16   Temp: 98.6 F (37 C) 98.5 F (36.9 C) 97.8 F (36.6 C) 98.3 F (36.8 C)  TempSrc: Oral Oral    SpO2: 97% 95% 94% 96%  Weight:      Height:       Filed Weights   08/01/21 1613  Weight: 67.1 kg   Body mass index is 20.07 kg/m.   Gen Exam:Alert awake-not in any distress HEENT:atraumatic, normocephalic Chest: B/L clear to auscultation anteriorly CVS:S1S2 regular Abdomen:soft non tender, slightly distended but not tense. Extremities: Significant improvement in left forearm/arm swelling/erythema.  1+ pitting edema in bilateral lower extremities. Neurology: Non focal Skin: no rash   I have personally reviewed following labs and imaging studies  LABORATORY DATA: CBC: Recent Labs  Lab 08/01/21 1503 08/02/21 0506 08/03/21 0507  WBC 9.4 6.1 5.1  NEUTROABS 7.8*  --   --   HGB 8.6* 7.9* 7.5*  HCT 26.6* 24.5* 23.2*  MCV 87.5 88.1 87.2  PLT 45* 40* 42*     Basic Metabolic Panel: Recent Labs  Lab 08/01/21 1503 08/02/21 0506 08/03/21 0507  NA 136 136 137  K 3.3* 3.1* 3.0*  CL 108 110 108  CO2 19* 21* 24  GLUCOSE 107* 94 96  BUN 16 15 15   CREATININE 1.02 0.83 0.87  CALCIUM 7.8* 7.8* 7.5*     GFR: Estimated Creatinine Clearance: 77.1 mL/min (by C-G formula based on SCr of 0.87 mg/dL).  Liver Function Tests: Recent Labs  Lab 08/01/21 1503 08/03/21 0507  AST 63* 44*  ALT 25 19  ALKPHOS 107 115  BILITOT 8.7* 3.8*  PROT 5.8* 4.8*  ALBUMIN 2.5* 1.8*    No results for input(s): LIPASE, AMYLASE in the last 168 hours. No results for input(s): AMMONIA in the last 168 hours.  Coagulation Profile: Recent Labs  Lab 08/01/21 1503  INR 2.1*     Cardiac Enzymes: Recent Labs  Lab 08/01/21 1603  CKTOTAL 386     BNP (last 3 results) No  results for input(s): PROBNP in the last 8760 hours.  Lipid Profile: No results for input(s): CHOL, HDL, LDLCALC, TRIG, CHOLHDL, LDLDIRECT in the last 72 hours.  Thyroid Function Tests: No results for input(s): TSH, T4TOTAL, FREET4, T3FREE, THYROIDAB in the last 72 hours.  Anemia Panel: No results for input(s): VITAMINB12, FOLATE, FERRITIN, TIBC, IRON, RETICCTPCT in the last 72 hours.  Urine analysis: No results found for: COLORURINE, APPEARANCEUR, LABSPEC, PHURINE, GLUCOSEU, HGBUR, BILIRUBINUR, KETONESUR, PROTEINUR, UROBILINOGEN, NITRITE, LEUKOCYTESUR  Sepsis Labs: Lactic Acid, Venous    Component Value Date/Time   LATICACIDVEN 1.8 08/02/2021 0506    MICROBIOLOGY: Recent Results (from the past 240 hour(s))  Resp Panel by RT-PCR (Flu  A&B, Covid) Nasopharyngeal Swab     Status: Abnormal   Collection Time: 08/01/21  3:03 PM   Specimen: Nasopharyngeal Swab; Nasopharyngeal(NP) swabs in vial transport medium  Result Value Ref Range Status   SARS Coronavirus 2 by RT PCR POSITIVE (A) NEGATIVE Final    Comment: RESULT CALLED TO, READ BACK BY AND VERIFIED WITH: KARA BRIDGES 08/01/21 1624 KLW (NOTE) SARS-CoV-2 target nucleic acids are DETECTED.  The SARS-CoV-2 RNA is generally detectable in upper respiratory specimens during the acute phase of infection. Positive results are indicative of the presence of the identified virus, but do not rule out bacterial infection or co-infection with other pathogens not detected by the test. Clinical correlation with patient history and other diagnostic information is necessary to determine patient infection status. The expected result is Negative.  Fact Sheet for Patients: BloggerCourse.comhttps://www.fda.gov/media/152166/download  Fact Sheet for Healthcare Providers: SeriousBroker.ithttps://www.fda.gov/media/152162/download  This test is not yet approved or cleared by the [place] FDA and  has been authorized for detection and/or diagnosis of SARS-CoV-2 by FDA under an  Emergency Use Authorization (EUA).  This EUA will remain in effect (meaning this test can be used ) for the duration of  the COVID-19 declaration under Section 564(b)(1) of the Act, 21 U.S.C. section 360bbb-3(b)(1), unless the authorization is terminated or revoked sooner.     Influenza A by PCR NEGATIVE NEGATIVE Final   Influenza B by PCR NEGATIVE NEGATIVE Final    Comment: (NOTE) The Xpert Xpress SARS-CoV-2/FLU/RSV plus assay is intended as an aid in the diagnosis of influenza from Nasopharyngeal swab specimens and should not be used as a sole basis for treatment. Nasal washings and aspirates are unacceptable for Xpert Xpress SARS-CoV-2/FLU/RSV testing.  Fact Sheet for Patients: BloggerCourse.comhttps://www.fda.gov/media/152166/download  Fact Sheet for Healthcare Providers: SeriousBroker.ithttps://www.fda.gov/media/152162/download  This test is not yet approved or cleared by the [place] FDA and has been authorized for detection and/or diagnosis of SARS-CoV-2 by FDA under an Emergency Use Authorization (EUA). This EUA will remain in effect (meaning this test can be used) for the duration of the COVID-19 declaration under Section 564(b)(1) of the Act, 21 U.S.C. section 360bbb-3(b)(1), unless the authorization is terminated or revoked.  Performed at [facility], [address] [place], [place] [address]   Blood Culture (routine x 2)     Status: Abnormal (Preliminary result)   Collection Time: 08/01/21  3:03 PM   Specimen: BLOOD  Result Value Ref Range Status   Specimen Description   Final    BLOOD BLOOD RIGHT FOREARM Performed at [facility], [address] [place], [place] [address]    Special Requests   Final    BOTTLES DRAWN AEROBIC AND ANAEROBIC Blood Culture adequate volume Performed at [facility], [address] [place], [place] [address]    Culture  Setup Time   Final    GRAM POSITIVE COCCI ANAEROBIC BOTTLE ONLY CRITICAL RESULT CALLED TO, READ BACK BY  AND VERIFIED WITH: DEVIN MITCHELL @0835  08/02/21 SCS    Culture (A)  Final    STAPHYLOCOCCUS HOMINIS THE SIGNIFICANCE OF ISOLATING THIS ORGANISM FROM A SINGLE SET OF BLOOD CULTURES WHEN MULTIPLE SETS ARE DRAWN IS UNCERTAIN. PLEASE NOTIFY THE MICROBIOLOGY DEPARTMENT WITHIN ONE WEEK IF SPECIATION AND SENSITIVITIES ARE REQUIRED. Performed at [facility], [address] [place], [place] [address]    Report Status PENDING  Incomplete  Blood Culture (routine x 2)     Status: None (Preliminary result)   Collection Time: 08/01/21  3:03  PM   Specimen: BLOOD  Result Value Ref Range Status   Specimen Description BLOOD RIGHT ANTECUBITAL  Final   Special Requests   Final    BOTTLES DRAWN AEROBIC AND ANAEROBIC Blood Culture adequate volume   Culture   Final    NO GROWTH 2 DAYS Performed at [facility], [address] [place], [place] [address]    Report Status PENDING  Incomplete  Blood Culture ID Panel (Reflexed)     Status: Abnormal   Collection Time: 08/01/21  3:03 PM  Result Value Ref Range Status   Enterococcus faecalis NOT DETECTED NOT DETECTED Final   Enterococcus Faecium NOT DETECTED NOT DETECTED Final   Listeria monocytogenes NOT DETECTED NOT DETECTED Final   Staphylococcus species DETECTED (A) NOT DETECTED Final    Comment: CRITICAL RESULT CALLED TO, READ BACK BY AND VERIFIED WITH: DEVIN MITCHELL  08/02/21 SCS    Staphylococcus aureus (BCID) NOT DETECTED NOT DETECTED Final   Staphylococcus epidermidis NOT DETECTED NOT DETECTED Final   Staphylococcus lugdunensis NOT DETECTED NOT DETECTED Final   Streptococcus species NOT DETECTED NOT DETECTED Final   Streptococcus agalactiae NOT DETECTED NOT DETECTED Final   Streptococcus pneumoniae NOT DETECTED NOT DETECTED Final   Streptococcus pyogenes NOT DETECTED NOT DETECTED Final   A.calcoaceticus-baumannii NOT DETECTED NOT DETECTED Final   Bacteroides fragilis NOT DETECTED NOT DETECTED Final   Enterobacterales NOT  DETECTED NOT DETECTED Final   Enterobacter cloacae complex NOT DETECTED NOT DETECTED Final   Escherichia coli NOT DETECTED NOT DETECTED Final   Klebsiella aerogenes NOT DETECTED NOT DETECTED Final   Klebsiella oxytoca NOT DETECTED NOT DETECTED Final   Klebsiella pneumoniae NOT DETECTED NOT DETECTED Final   Proteus species NOT DETECTED NOT DETECTED Final   Salmonella species NOT DETECTED NOT DETECTED Final   Serratia marcescens NOT DETECTED NOT DETECTED Final   Haemophilus influenzae NOT DETECTED NOT DETECTED Final   Neisseria meningitidis NOT DETECTED NOT DETECTED Final   Pseudomonas aeruginosa NOT DETECTED NOT DETECTED Final   Stenotrophomonas maltophilia NOT DETECTED NOT DETECTED Final   Candida albicans NOT DETECTED NOT DETECTED Final   Candida auris NOT DETECTED NOT DETECTED Final   Candida glabrata NOT DETECTED NOT DETECTED Final   Candida krusei NOT DETECTED NOT DETECTED Final   Candida parapsilosis NOT DETECTED NOT DETECTED Final   Candida tropicalis NOT DETECTED NOT DETECTED Final   Cryptococcus neoformans/gattii NOT DETECTED NOT DETECTED Final    Comment: Performed at [facility], [address] [place], [place] [address]    RADIOLOGY STUDIES/RESULTS: DG Elbow Complete Left  Result Date: 08/01/2021 CLINICAL DATA:  Left arm infection, pain EXAM: LEFT ELBOW - COMPLETE 3+ VIEW; LEFT WRIST - COMPLETE 3+ VIEW COMPARISON:  None. FINDINGS: Left elbow: Frontal, bilateral oblique, lateral views are obtained. No acute fracture, subluxation, or dislocation. Joint spaces are well preserved. No joint effusion. Severe soft tissue swelling throughout the visualized left elbow. No radiopaque foreign body. Left wrist: Frontal, oblique, lateral views demonstrate no fracture, subluxation, or dislocation. Joint spaces are well preserved. Diffuse soft tissue edema, greatest throughout the dorsum of the wrist and visualized hand. No radiopaque foreign body. IMPRESSION: 1. Diffuse soft tissue  swelling of the visualized portions of the left wrist and elbow. No radiopaque foreign body or subcutaneous gas. 2. No acute bony abnormalities. Electronically Signed   By: Sharlet Salina M.D.   On: 08/01/2021 16:05   DG Forearm Left  Result Date: 08/02/2021 CLINICAL DATA:  Edema and cellulitis. EXAM: LEFT  FOREARM - 2 VIEW COMPARISON:  Prior studies 08/01/2021. FINDINGS: No acute bony or joint abnormality identified. No evidence of fracture or dislocation. Diffuse soft tissue swelling. Peripheral vascular calcification. IMPRESSION: 1. Diffuse soft tissue swelling. Peripheral vascular calcification. 2.  No acute bony abnormality. Electronically Signed   By: Maisie Fus  Register   On: 08/02/2021 12:29   DG Wrist Complete Left  Result Date: 08/01/2021 CLINICAL DATA:  Left arm infection, pain EXAM: LEFT ELBOW - COMPLETE 3+ VIEW; LEFT WRIST - COMPLETE 3+ VIEW COMPARISON:  None. FINDINGS: Left elbow: Frontal, bilateral oblique, lateral views are obtained. No acute fracture, subluxation, or dislocation. Joint spaces are well preserved. No joint effusion. Severe soft tissue swelling throughout the visualized left elbow. No radiopaque foreign body. Left wrist: Frontal, oblique, lateral views demonstrate no fracture, subluxation, or dislocation. Joint spaces are well preserved. Diffuse soft tissue edema, greatest throughout the dorsum of the wrist and visualized hand. No radiopaque foreign body. IMPRESSION: 1. Diffuse soft tissue swelling of the visualized portions of the left wrist and elbow. No radiopaque foreign body or subcutaneous gas. 2. No acute bony abnormalities. Electronically Signed   By: Sharlet Salina M.D.   On: 08/01/2021 16:05   US Venous Img Upper Uni Left  Result Date: 08/01/2021 CLINICAL DATA:  Left upper extremity pain and swelling for 2 weeks EXAM: LEFT UPPER EXTREMITY VENOUS DOPPLER ULTRASOUND TECHNIQUE: Gray-scale sonography with graded compression, as well as color Doppler and duplex ultrasound  were performed to evaluate the upper extremity deep venous system from the level of the subclavian vein and including the jugular, axillary, basilic, radial, ulnar and upper cephalic vein. Spectral Doppler was utilized to evaluate flow at rest and with distal augmentation maneuvers. COMPARISON:  None. FINDINGS: Contralateral Subclavian Vein: Respiratory phasicity is normal and symmetric with the symptomatic side. No evidence of thrombus. Normal compressibility. Internal Jugular Vein: No evidence of thrombus. Normal compressibility, respiratory phasicity and response to augmentation. Subclavian Vein: No evidence of thrombus. Normal compressibility, respiratory phasicity and response to augmentation. Axillary Vein: No evidence of thrombus. Normal compressibility, respiratory phasicity and response to augmentation. Cephalic Vein: No evidence of thrombus. Normal compressibility, respiratory phasicity and response to augmentation. Basilic Vein: No evidence of thrombus. Normal compressibility, respiratory phasicity and response to augmentation. Brachial Veins: No evidence of thrombus. Normal compressibility, respiratory phasicity and response to augmentation. Radial Veins: No evidence of thrombus. Normal compressibility, respiratory phasicity and response to augmentation. Ulnar Veins: No evidence of thrombus. Normal compressibility, respiratory phasicity and response to augmentation. Venous Reflux:  None visualized. Other Findings:  Soft tissue edema. IMPRESSION: 1. Negative examination for deep venous thrombosis within the left upper extremity. 2.  Diffuse soft tissue edema. Electronically Signed   By: Lauralyn Primes M.D.   On: 08/01/2021 16:41   DG Chest Port 1 View  Result Date: 08/01/2021 CLINICAL DATA:  Sepsis. EXAM: PORTABLE CHEST 1 VIEW COMPARISON:  CT chest dated July 23, 2021 FINDINGS: Cardiac and mediastinal contours within normal limits. Small right pleural effusion with associated atelectasis. Background  emphysematous changes. No evidence of pneumothorax. IMPRESSION: Small right pleural effusion with associated atelectasis, findings appear unchanged compared to prior CT. Electronically Signed   By: Allegra Lai MD   On: 08/01/2021 15:29     LOS: 2 days   Jeoffrey Massed, MD  [facility]    To contact the attending provider between 7A-7P or the covering provider during after hours 7P-7A, please log into the web site [email or web address] and access using universal [place]  password for that web site. If you do not have the password, please call the hospital operator.  08/03/2021, 2:21 PM

## 2021-08-03 NOTE — TOC Initial Note (Signed)
Transition of Care [facility]) - Initial/Assessment Note    Patient Details  Name: Johnny Dunlap MRN: [id number] Date of Birth: Feb 09, 1950  [facility] CM/SW Contact:    Shelbie Hutching, RN Phone Number: 08/03/2021, 4:28 PM  Clinical Narrative:                 Patient admitted to the hospital with sepsis, left arm cellulitis.  History of COPD and liver cirrhosis.  RNCM met with patient at the bedside.  Patient is homeless and reports he has been staying on the streets, he reports not going to the homeless shelter that they could not help him.  He has been in [place] since July.  He came from [place] because he needed a change of scenery.  Patient does have a wheelchair, he reports that it is at the homeless shelter on [address] but that he needs an Clinical research associate.   PT and OT are recommending SNF.  Patient is hesitant but agrees to short term rehab, he is concerned about a facility taking everything he has.  He reports that he has Medicaid and gets Fish farm manager.  RNCM will begin bed search.  Expected Discharge Plan: Skilled Nursing Facility Barriers to Discharge: Continued Medical Work up   Patient Goals and CMS Choice Patient states their goals for this hospitalization and ongoing recovery are:: Patient wants to find a place to live that doesn't make him crazy and take everything aware from him CMS Medicare.gov Compare Post Acute Care list provided to:: Patient Choice offered to / list presented to : Patient  Expected Discharge Plan and Services Expected Discharge Plan: [place]   Discharge Planning Services: CM Consult Post Acute Care Choice: [place] Living arrangements for the past 2 months: Homeless                 DME Arranged: N/A         HH Arranged: NA          Prior Living Arrangements/Services Living arrangements for the past 2 months: Homeless Lives with:: Self Patient language and need for interpreter  reviewed:: Yes Do you feel safe going back to the place where you live?: No   needs a better wheelchair- needs a place to live  Need for Family Participation in Patient Care: Yes (Comment) Care giver support system in place?: No (comment) Current home services: DME (wheelchair) Criminal Activity/Legal Involvement Pertinent to Current Situation/Hospitalization: No - Comment as needed  Activities of Daily Living Home Assistive Devices/Equipment: Wheelchair, Eyeglasses ADL Screening (condition at time of admission) Patient's cognitive ability adequate to safely complete daily activities?: Yes Is the patient deaf or have difficulty hearing?: Yes Does the patient have difficulty seeing, even when wearing glasses/contacts?: No Does the patient have difficulty concentrating, remembering, or making decisions?: No Patient able to express need for assistance with ADLs?: Yes Does the patient have difficulty dressing or bathing?: Yes Independently performs ADLs?: No Does the patient have difficulty walking or climbing stairs?: Yes Weakness of Legs: Both Weakness of Arms/Hands: Both  Permission Sought/Granted Permission sought to share information with : Case Manager, Customer service manager, Other (comment) Permission granted to share information with : Yes, Verbal Permission Granted     Permission granted to share info w AGENCY: [facility] [employer] [facility], SNF's        Emotional Assessment Appearance:: Appears older than stated age Attitude/Demeanor/Rapport: Engaged Affect (typically observed): Accepting Orientation: : Oriented to Self, Oriented to Place, Oriented to  Time, Oriented to Situation Alcohol / Substance Use: Not Applicable Psych Involvement: No (comment)  Admission diagnosis:  Homelessness [Z59.00] Left arm cellulitis [L03.114] Sepsis ([place]) [A41.9] Patient Active Problem List   Diagnosis Date Noted   Sepsis ([place]) 08/01/2021   Left arm cellulitis    Lactic acidosis    Other  cirrhosis of liver (HCC)    Thrombocytopenia (HCC)    COVID-19 virus infection    Iron deficiency anemia    Hypokalemia    GI bleed 07/23/2021   PCP:  Pcp, No Pharmacy:  No Pharmacies Listed    Social Determinants of Health (SDOH) Interventions    Readmission Risk Interventions No flowsheet data found.

## 2021-08-03 NOTE — Progress Notes (Signed)
Patient states that he feels that his left arm pain and swelling is somewhat improved.  He was comfortably eating lunch at the time of my visit with him.  He denies any fevers or chills.  He states he was unable to tolerate the MRI yesterday as he just felt too trapped and confined.  He did not wish to attempt to obtain MRI with medication either.  RUE: +ain/pin/u motor SILT r/u/m/ax +rad pulse Able to range shoulder, elbow, and wrist.  Patient able to make a closed fist.  Hand swelling has significantly improved.  There is also been interval improvement compared to yesterday in regards to forearm swelling.  Whether still erythema present along the entire forearm and up to the mid humerus level, it is less severe appearing compared to yesterday.  Additionally, tenderness in the erythematous region has also significantly improved and is only mildly tender.  There does not appear to be a palpable fluid collection.   Plan: 1.  Continue IV antibiotics, especially as the patient has made significant improvement in his clinical exam and subjective feeling.  2.  Can defer MRI given significant clinical improvement and patient's inability to tolerate this.  Given improvement in symptoms, and underlying abscess is less likely.  3.  No planned surgical intervention.  I will plan to follow the patient peripherally.  Please page with any further questions.

## 2021-08-04 DIAGNOSIS — A419 Sepsis, unspecified organism: Secondary | ICD-10-CM | POA: Diagnosis not present

## 2021-08-04 DIAGNOSIS — L03114 Cellulitis of left upper limb: Secondary | ICD-10-CM | POA: Diagnosis not present

## 2021-08-04 DIAGNOSIS — R6520 Severe sepsis without septic shock: Secondary | ICD-10-CM | POA: Diagnosis not present

## 2021-08-04 DIAGNOSIS — Z5900 Homelessness unspecified: Secondary | ICD-10-CM | POA: Diagnosis not present

## 2021-08-04 LAB — MAGNESIUM: Magnesium: 1.6 mg/dL — ABNORMAL LOW (ref 1.7–2.4)

## 2021-08-04 LAB — CULTURE, BLOOD (ROUTINE X 2): Special Requests: ADEQUATE

## 2021-08-04 LAB — BASIC METABOLIC PANEL
Anion gap: 3 — ABNORMAL LOW (ref 5–15)
BUN: 13 mg/dL (ref 8–23)
CO2: 24 mmol/L (ref 22–32)
Calcium: 7.4 mg/dL — ABNORMAL LOW (ref 8.9–10.3)
Chloride: 108 mmol/L (ref 98–111)
Creatinine, Ser: 0.81 mg/dL (ref 0.61–1.24)
GFR, Estimated: >60 mL/min (ref >60)
Glucose, Bld: 89 mg/dL (ref 70–99)
Potassium: 3.4 mmol/L — ABNORMAL LOW (ref 3.5–5.1)
Sodium: 135 mmol/L (ref 135–145)

## 2021-08-04 MED ORDER — SPIRONOLACTONE 25 MG PO TABS
50.0000 mg | ORAL_TABLET | Freq: Every day | ORAL | Status: DC
  Administered 2021-08-04 – 2021-08-05 (×2): 50 mg via ORAL
  Filled 2021-08-04 (×2): qty 2

## 2021-08-04 MED ORDER — MAGNESIUM SULFATE 4 GM/100ML IV SOLN
4.0000 g | Freq: Once | INTRAVENOUS | Status: AC
  Administered 2021-08-04: 10:00:00 4 g via INTRAVENOUS
  Filled 2021-08-04 (×2): qty 100

## 2021-08-04 MED ORDER — POTASSIUM CHLORIDE CRYS ER 20 MEQ PO TBCR
40.0000 meq | EXTENDED_RELEASE_TABLET | Freq: Once | ORAL | Status: AC
  Administered 2021-08-04: 08:00:00 40 meq via ORAL
  Filled 2021-08-04: qty 2

## 2021-08-04 MED ORDER — ALBUTEROL SULFATE HFA 108 (90 BASE) MCG/ACT IN AERS
2.0000 | INHALATION_SPRAY | Freq: Four times a day (QID) | RESPIRATORY_TRACT | Status: DC | PRN
  Administered 2021-08-04 (×2): 2 via RESPIRATORY_TRACT
  Filled 2021-08-04 (×3): qty 6.7

## 2021-08-04 NOTE — TOC Progression Note (Signed)
Transition of Care [facility]) - Progression Note    Patient Details  Name: Johnny Dunlap MRN: [id number] Date of Birth: 1950-09-28  Transition of Care [facility]) CM/SW Contact  Allayne Butcher, RN Phone Number: 08/04/2021, 10:17 AM  Clinical Narrative:    RNCM has reached out to the [facility] to notify them of admission.  Per [facility] they have been trying to contact patient for the past month.  He missed his mental health appointment- he needs to call them at [phone] ext. [phone].  Bertrum Sol [phone] is the social worker assigned and was trying to work on getting the patient a place to live at [facility].   [facility] requests clinicals - RNCM faxed clinical to [phone]- they have no beds available at this time.    Expected Discharge Plan: Skilled Nursing Facility Barriers to Discharge: Continued Medical Work up  Expected Discharge Plan and Services Expected Discharge Plan: Skilled Nursing Facility   Discharge Planning Services: CM Consult Post Acute Care Choice: Skilled Nursing Facility Living arrangements for the past 2 months: Homeless                 DME Arranged: N/A         HH Arranged: NA           Social Determinants of Health (SDOH) Interventions    Readmission Risk Interventions No flowsheet data found.

## 2021-08-04 NOTE — Progress Notes (Signed)
PROGRESS NOTE        PATIENT DETAILS Name: Johnny Dunlap Age: 71 y.o. Sex: male Date of Birth: 03-31-1950 Admit Date: 08/01/2021 Admitting Physician Alford Highland, MD PCP:Pcp, No  Brief Narrative: Patient is a 71 y.o. male with history of liver cirrhosis, COPD, recent COVID 19 infection-presenting with left forearm cellulitis.  Significant events: 8/2>> admit for left forearm soft tissue infection  Significant studies: 8/2>> x-ray chest: No pneumonia-small right pleural effusion 8/2>> x-ray left elbow/left wrist: Diffuse soft tissue swelling 8/3>> x-ray left forearm: Diffuse soft tissue swelling  Antimicrobial therapy: Ancef: 8/3>>  Microbiology data: 8/2>> blood culture 1/2: Staph hominis  Procedures : None  Consults: Orthopedics  DVT Prophylaxis : SCDs Start: 08/01/21 1807 Place TED hose Start: 08/01/21 1807   Subjective: Continues to have improvement in swelling/erythema of left forearm.  Some swelling in his legs.  Assessment/Plan: Sepsis due to left forearm cellulitis: Sepsis physiology has resolved-left arm soft tissue infection with significant improvement with less edema/erythema and pain.  Continue IV Ancef-we will plan to switch to oral antimicrobial therapy in the next few days.    Blood cultures-1/2 positive for staph hominis-highly suspicious for a contamination.    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  Will reassess on 8/6 to see if Lasix can be added.    Recent COVID-19 infection: Currently asymptomatic-probably can come off isolation as of 8/4  Hypokalemia/hypomagnesemia: Continue to replete and recheck in a.m.  Homelessness: will need CM/SW involvement prior to discharge.   Diet: Diet Order             Diet regular Room service appropriate?  Yes; Fluid consistency: Thin  Diet effective now                    Code Status: Full code   Family Communication: None at bedside  Disposition Plan: Status is: Inpatient  Remains inpatient appropriate because:Inpatient level of care appropriate due to severity of illness  Dispo: The patient is from: Home              Anticipated d/c is to: Home              Patient currently is not medically stable to d/c.   Difficult to place patient No   Barriers to Discharge: Left forearm soft tissue infection on IV antibiotics.  Antimicrobial agents: Anti-infectives (From admission, onward)    Start     Dose/Rate Route Frequency Ordered Stop   08/02/21 1600  ceFAZolin (ANCEF) IVPB 2g/100 mL premix        2 g 200 mL/hr over 30 Minutes Intravenous Every 8 hours 08/01/21 1820     08/01/21 2200  ceFAZolin (ANCEF) IVPB 2g/100 mL premix  Status:  Discontinued        2 g 200 mL/hr over 30 Minutes Intravenous Every 8 hours 08/01/21 1804 08/01/21 1820   08/01/21 1500  vancomycin (VANCOCIN) IVPB 1000 mg/200 mL premix        1,000 mg 200 mL/hr over 60 Minutes Intravenous  Once 08/01/21 1449 08/01/21 1730   08/01/21 1500  cefTRIAXone (ROCEPHIN) 2 g in sodium chloride 0.9 % 100 mL IVPB        2 g 200 mL/hr over 30 Minutes Intravenous  Once 08/01/21 1449 08/01/21 1613        Time spent: 25 minutes-Greater than 50% of this time was spent in counseling, explanation of diagnosis, planning of further management, and coordination of care.  MEDICATIONS: Scheduled Meds:  feeding supplement  237 mL Oral Q24H   midodrine  5 mg Oral TID WC   multivitamin with minerals  1 tablet Oral Daily   spironolactone  50 mg Oral Daily   Continuous Infusions:   ceFAZolin (ANCEF) IV 2 g (08/04/21 0818)   PRN Meds:.acetaminophen **OR** acetaminophen, ondansetron **OR** ondansetron (ZOFRAN) IV, oxyCODONE   PHYSICAL EXAM: Vital signs: Vitals:   08/03/21 2332 08/04/21 0405 08/04/21 0808 08/04/21 1057   BP: 98/61 (!) 100/57 101/64 (!) 100/59  Pulse: 79 66 61 74  Resp: 17 16 15 16   Temp: 98.9 F (37.2 C) 98 F (36.7 C) 98.1 F (36.7 C) 97.9 F (36.6 C)  TempSrc:   Oral Oral  SpO2: 93% 94% 96% 97%  Weight:      Height:       Filed Weights   08/01/21 1613  Weight: 67.1 kg   Body mass index is 20.07 kg/m.   Gen Exam:Alert awake-not in any distress HEENT:atraumatic, normocephalic Chest: B/L clear to auscultation anteriorly CVS:S1S2 regular Abdomen:soft non tender, non distended Extremities: 1+ pitting edema both lower extremities.  Left upper extremity-still with erythema but better, some swelling still persists. Neurology: Non focal Skin: no rash   I have personally reviewed following labs and imaging studies  LABORATORY DATA: CBC: Recent Labs  Lab 08/01/21 1503 08/02/21 0506 08/03/21 0507  WBC 9.4 6.1 5.1  NEUTROABS 7.8*  --   --   HGB 8.6* 7.9* 7.5*  HCT 26.6* 24.5* 23.2*  MCV 87.5 88.1 87.2  PLT 45* 40* 42*     Basic Metabolic Panel: Recent Labs  Lab 08/01/21 1503 08/02/21 0506 08/03/21 0507 08/04/21 0432  NA 136 136 137 135  K 3.3* 3.1* 3.0* 3.4*  CL 108 110 108 108  CO2 19* 21* 24 24  GLUCOSE 107* 94 96 89  BUN 16 15 15 13   CREATININE 1.02 0.83 0.87 0.81  CALCIUM 7.8* 7.8* 7.5* 7.4*  MG  --   --   --  1.6*     GFR: Estimated Creatinine Clearance: 82.8 mL/min (by C-G formula based on SCr of 0.81 mg/dL).  Liver Function Tests: Recent Labs  Lab 08/01/21 1503 08/03/21 0507  AST 63* 44*  ALT 25 19  ALKPHOS 107 115  BILITOT 8.7* 3.8*  PROT 5.8* 4.8*  ALBUMIN 2.5* 1.8*    No results for input(s): LIPASE, AMYLASE in the last 168 hours. No results for input(s): AMMONIA in the last 168 hours.  Coagulation Profile: Recent Labs  Lab 08/01/21 1503  INR 2.1*     Cardiac Enzymes: Recent Labs  Lab 08/01/21 1603  CKTOTAL 386     BNP (last 3 results) No results for input(s): PROBNP in the last 8760 hours.  Lipid Profile: No  results for input(s): CHOL, HDL, LDLCALC, TRIG, CHOLHDL, LDLDIRECT in the last 72 hours.  Thyroid Function Tests: No results for input(s): TSH, T4TOTAL, FREET4, T3FREE, THYROIDAB in the last 72 hours.  Anemia Panel: No results for input(s): VITAMINB12, FOLATE, FERRITIN, TIBC, IRON, RETICCTPCT in the last 72 hours.  Urine analysis: No results found for: COLORURINE, APPEARANCEUR, LABSPEC, PHURINE, GLUCOSEU, HGBUR, BILIRUBINUR, KETONESUR, PROTEINUR, UROBILINOGEN, NITRITE, LEUKOCYTESUR  Sepsis Labs: Lactic Acid, Venous    Component Value Date/Time   LATICACIDVEN 1.8  08/02/2021 0506    MICROBIOLOGY: Recent Results (from the past 240 hour(s))  Resp Panel by RT-PCR (Flu A&B, Covid) Nasopharyngeal Swab     Status: Abnormal   Collection Time: 08/01/21  3:03 PM   Specimen: Nasopharyngeal Swab; Nasopharyngeal(NP) swabs in vial transport medium  Result Value Ref Range Status   SARS Coronavirus 2 by RT PCR POSITIVE (A) NEGATIVE Final    Comment: RESULT CALLED TO, READ BACK BY AND VERIFIED WITH: KARA BRIDGES 08/01/21 1624 KLW (NOTE) SARS-CoV-2 target nucleic acids are DETECTED.  The SARS-CoV-2 RNA is generally detectable in upper respiratory specimens during the acute phase of infection. Positive results are indicative of the presence of the identified virus, but do not rule out bacterial infection or co-infection with other pathogens not detected by the test. Clinical correlation with patient history and other diagnostic information is necessary to determine patient infection status. The expected result is Negative.  Fact Sheet for Patients: BloggerCourse.comhttps://www.fda.gov/media/152166/download  Fact Sheet for Healthcare Providers: SeriousBroker.ithttps://www.fda.gov/media/152162/download  This test is not yet approved or cleared by the [place] FDA and  has been authorized for detection and/or diagnosis of SARS-CoV-2 by FDA under an Emergency Use Authorization (EUA).  This EUA will remain in effect  (meaning this test can be used ) for the duration of  the COVID-19 declaration under Section 564(b)(1) of the Act, 21 U.S.C. section 360bbb-3(b)(1), unless the authorization is terminated or revoked sooner.     Influenza A by PCR NEGATIVE NEGATIVE Final   Influenza B by PCR NEGATIVE NEGATIVE Final    Comment: (NOTE) The Xpert Xpress SARS-CoV-2/FLU/RSV plus assay is intended as an aid in the diagnosis of influenza from Nasopharyngeal swab specimens and should not be used as a sole basis for treatment. Nasal washings and aspirates are unacceptable for Xpert Xpress SARS-CoV-2/FLU/RSV testing.  Fact Sheet for Patients: BloggerCourse.comhttps://www.fda.gov/media/152166/download  Fact Sheet for Healthcare Providers: SeriousBroker.ithttps://www.fda.gov/media/152162/download  This test is not yet approved or cleared by the [place] FDA and has been authorized for detection and/or diagnosis of SARS-CoV-2 by FDA under an Emergency Use Authorization (EUA). This EUA will remain in effect (meaning this test can be used) for the duration of the COVID-19 declaration under Section 564(b)(1) of the Act, 21 U.S.C. section 360bbb-3(b)(1), unless the authorization is terminated or revoked.  Performed at [facility], [address] [place], [place] [address]   Blood Culture (routine x 2)     Status: Abnormal   Collection Time: 08/01/21  3:03 PM   Specimen: BLOOD  Result Value Ref Range Status   Specimen Description   Final    BLOOD BLOOD RIGHT FOREARM Performed at [facility], [address] [place], [place] [address]    Special Requests   Final    BOTTLES DRAWN AEROBIC AND ANAEROBIC Blood Culture adequate volume Performed at [facility], [address] [place], [place] [address]    Culture  Setup Time   Final    GRAM POSITIVE COCCI ANAEROBIC BOTTLE ONLY CRITICAL RESULT CALLED TO, READ BACK BY AND VERIFIED WITH: DEVIN MITCHELL @0835  08/02/21 SCS    Culture (A)  Final     STAPHYLOCOCCUS HOMINIS THE SIGNIFICANCE OF ISOLATING THIS ORGANISM FROM A SINGLE SET OF BLOOD CULTURES WHEN MULTIPLE SETS ARE DRAWN IS UNCERTAIN. PLEASE NOTIFY THE MICROBIOLOGY DEPARTMENT WITHIN ONE WEEK IF SPECIATION AND SENSITIVITIES ARE REQUIRED. Performed at [facility], [address] [place], [place] [address]    Report Status 08/04/2021 FINAL  Final  Blood Culture (  routine x 2)     Status: None (Preliminary result)   Collection Time: 08/01/21  3:03 PM   Specimen: BLOOD  Result Value Ref Range Status   Specimen Description BLOOD RIGHT ANTECUBITAL  Final   Special Requests   Final    BOTTLES DRAWN AEROBIC AND ANAEROBIC Blood Culture adequate volume   Culture   Final    NO GROWTH 3 DAYS Performed at [facility], [address] [place], [place] [address]    Report Status PENDING  Incomplete  Blood Culture ID Panel (Reflexed)     Status: Abnormal   Collection Time: 08/01/21  3:03 PM  Result Value Ref Range Status   Enterococcus faecalis NOT DETECTED NOT DETECTED Final   Enterococcus Faecium NOT DETECTED NOT DETECTED Final   Listeria monocytogenes NOT DETECTED NOT DETECTED Final   Staphylococcus species DETECTED (A) NOT DETECTED Final    Comment: CRITICAL RESULT CALLED TO, READ BACK BY AND VERIFIED WITH: DEVIN MITCHELL @0835  08/02/21 SCS    Staphylococcus aureus (BCID) NOT DETECTED NOT DETECTED Final   Staphylococcus epidermidis NOT DETECTED NOT DETECTED Final   Staphylococcus lugdunensis NOT DETECTED NOT DETECTED Final   Streptococcus species NOT DETECTED NOT DETECTED Final   Streptococcus agalactiae NOT DETECTED NOT DETECTED Final   Streptococcus pneumoniae NOT DETECTED NOT DETECTED Final   Streptococcus pyogenes NOT DETECTED NOT DETECTED Final   A.calcoaceticus-baumannii NOT DETECTED NOT DETECTED Final   Bacteroides fragilis NOT DETECTED NOT DETECTED Final   Enterobacterales NOT DETECTED NOT DETECTED Final   Enterobacter cloacae complex NOT DETECTED NOT  DETECTED Final   Escherichia coli NOT DETECTED NOT DETECTED Final   Klebsiella aerogenes NOT DETECTED NOT DETECTED Final   Klebsiella oxytoca NOT DETECTED NOT DETECTED Final   Klebsiella pneumoniae NOT DETECTED NOT DETECTED Final   Proteus species NOT DETECTED NOT DETECTED Final   Salmonella species NOT DETECTED NOT DETECTED Final   Serratia marcescens NOT DETECTED NOT DETECTED Final   Haemophilus influenzae NOT DETECTED NOT DETECTED Final   Neisseria meningitidis NOT DETECTED NOT DETECTED Final   Pseudomonas aeruginosa NOT DETECTED NOT DETECTED Final   Stenotrophomonas maltophilia NOT DETECTED NOT DETECTED Final   Candida albicans NOT DETECTED NOT DETECTED Final   Candida auris NOT DETECTED NOT DETECTED Final   Candida glabrata NOT DETECTED NOT DETECTED Final   Candida krusei NOT DETECTED NOT DETECTED Final   Candida parapsilosis NOT DETECTED NOT DETECTED Final   Candida tropicalis NOT DETECTED NOT DETECTED Final   Cryptococcus neoformans/gattii NOT DETECTED NOT DETECTED Final    Comment: Performed at [facility], [address], [place], [place]    RADIOLOGY STUDIES/RESULTS: No results found.   LOS: 3 days   [id number], MD  [facility]    To contact the attending provider between 7A-7P or the covering provider during after hours 7P-7A, please log into the web site [email or web address] and access using universal [place] password for that web site. If you do not have the password, please call the hospital operator.  08/04/2021, 12:33 PM

## 2021-08-04 NOTE — Progress Notes (Signed)
Physical Therapy Treatment Patient Details Name: Johnny Dunlap MRN: [id number] DOB: 08-31-1950 Today's Date: 08/04/2021    History of Present Illness 71 y.o. M here with GI bleed a few weeks ago, now with progessive pain and swelling affecting his left arm, admitted with sepsis/cellulitis.  He states that it is very painful for him to move the arm and that it has looked very red recently.  He has not sure if he has had a fever and he denies any recent trauma to the arm.  Pt brought in by EMS who apparently found him outside of homeless shelter covered in stool and dirt. PMH of COPD, cirrhosis.    PT Comments    Pt seen for PT tx with nurse present placing new IV but pt continuing to perseverate on beeping IV with PT providing education. Pt requires MAX encouragement & education re: need for OOB mobility. Pt requires CGA for bed mobility, min assist for sit>stand & stand pivot to recliner without AD 2/2 unsteadiness & pt declining use of AD. Pt declines further mobility at this time. Continue to recommend STR upon d/c to maximize independence with mobility & reduce fall risk.     Follow Up Recommendations  SNF;Supervision/Assistance - 24 hour     Equipment Recommendations   (TBD in next venue)    Recommendations for Other Services       Precautions / Restrictions Precautions Precautions: Fall Restrictions Weight Bearing Restrictions: No    Mobility  Bed Mobility Overal bed mobility: Needs Assistance Bed Mobility: Supine to Sit     Supine to sit: Min guard;HOB elevated     General bed mobility comments: elects to hold to PT's hand to assist with supine>sit vs using bed rails as instructed    Transfers Overall transfer level: Needs assistance Equipment used: None Transfers: Sit to/from Stand;Stand Pivot Transfers Sit to Stand: Min assist Stand pivot transfers: Min assist       General transfer comment: Pt reports "I can get to the chair" without the walker, but requires  min assist 2/2 unsteadiness & for PT to guide pt to sitting in recliner  Ambulation/Gait Ambulation/Gait assistance:  (pt declines)               Stairs             Wheelchair Mobility    Modified Rankin (Stroke Patients Only)       Balance Overall balance assessment: Needs assistance Sitting-balance support: Bilateral upper extremity supported;Feet supported Sitting balance-Leahy Scale: Good     Standing balance support: No upper extremity supported;During functional activity Standing balance-Leahy Scale: Fair                              Cognition   Behavior During Therapy: WFL for tasks assessed/performed Overall Cognitive Status: No family/caregiver present to determine baseline cognitive functioning                                 General Comments: Pt perseverative on beeping IV despite nurse inserting new IV, requires encouragement for participation      Exercises      General Comments General comments (skin integrity, edema, etc.): Pt on room air, SpO2 dropped to 82% with mobility but quickly returned to 90% or > with rest -nurse in room & aware & reports she will put pt on supplemental O2  Pertinent Vitals/Pain Pain Assessment: No/denies pain    Home Living                      Prior Function            PT Goals (current goals can now be found in the care plan section) Acute Rehab PT Goals Patient Stated Goal: to get a motorized scooter PT Goal Formulation: With patient Time For Goal Achievement: 08/16/21 Potential to Achieve Goals: Fair Progress towards PT goals: Progressing toward goals    Frequency    Min 2X/week      PT Plan Current plan remains appropriate    Co-evaluation              AM-PAC PT "6 Clicks" Mobility   Outcome Measure  Help needed turning from your back to your side while in a flat bed without using bedrails?: A Little Help needed moving from lying on your  back to sitting on the side of a flat bed without using bedrails?: A Little Help needed moving to and from a bed to a chair (including a wheelchair)?: A Little Help needed standing up from a chair using your arms (e.g., wheelchair or bedside chair)?: A Little Help needed to walk in hospital room?: A Lot Help needed climbing 3-5 steps with a railing? : A Lot 6 Click Score: 16    End of Session   Activity Tolerance:  (pt self limiting) Patient left: in chair;with chair alarm set;with call bell/phone within reach Nurse Communication: Mobility status PT Visit Diagnosis: Other abnormalities of gait and mobility (R26.89);Muscle weakness (generalized) (M62.81)     Time: 4132-4401 PT Time Calculation (min) (ACUTE ONLY): 23 min  Charges:  $Therapeutic Activity: 23-37 mins                     Aleda Grana, PT, DPT 08/04/21, 12:25 PM    Sandi Mariscal 08/04/2021, 12:23 PM

## 2021-08-04 NOTE — Care Management Important Message (Signed)
Important Message  Patient Details  Name: Johnny Dunlap MRN: [id number] Date of Birth: [birth year]   Medicare Important Message Given:  Other (see comment)  Patient is in an isolation room so I called his room [phone] but there was no answer. Will try again.   [name] A [name] 08/04/2021, 2:51 PM

## 2021-08-04 NOTE — [place] FL2 (Signed)
[place] MEDICAID FL2 LEVEL OF CARE SCREENING TOOL     IDENTIFICATION  Patient Name: Johnny Dunlap Birthdate: 1950-04-26 Sex: male Admission Date (Current Location): 08/01/2021  [facility] and [place] Number:  Chiropodist and Address:  [facility], [address] [place], [place] [address]      Provider Number: [id number]  Attending Physician Name and Address:  Maretta Bees, MD  Relative Name and Phone Number:       Current Level of Care: Hospital Recommended Level of Care: Skilled Nursing Facility Prior Approval Number:    Date Approved/Denied:   PASRR Number: [id number] F- expires 08/17/21  Discharge Plan: SNF    Current Diagnoses: Patient Active Problem List   Diagnosis Date Noted   Sepsis (HCC) 08/01/2021   Left arm cellulitis    Lactic acidosis    Other cirrhosis of liver (HCC)    Thrombocytopenia (HCC)    COVID-19 virus infection    Iron deficiency anemia    Hypokalemia    GI bleed 07/23/2021    Orientation RESPIRATION BLADDER Height & Weight     Self, Time, Situation, Place  Normal Continent Weight: 67.1 kg Height:  6' (182.9 cm)  BEHAVIORAL SYMPTOMS/MOOD NEUROLOGICAL BOWEL NUTRITION STATUS      Continent Diet (see discharge summary)  AMBULATORY STATUS COMMUNICATION OF NEEDS Skin   Extensive Assist Verbally Other (Comment) (cellulitis left arm- elevate)                       Personal Care Assistance Level of Assistance  Bathing, Feeding, Dressing Bathing Assistance: Limited assistance Feeding assistance: Independent Dressing Assistance: Limited assistance     Functional Limitations Info  Sight, Hearing, Speech Sight Info: Adequate Hearing Info: Adequate Speech Info: Adequate    SPECIAL CARE FACTORS FREQUENCY  PT (By licensed PT), OT (By licensed OT)     PT Frequency: 5 times per week OT Frequency: 3-5 times per week            Contractures Contractures Info: Not present    Additional  Factors Info  Code Status, Allergies Code Status Info: DNR Allergies Info: NKA           Current Medications (08/04/2021):  This is the current hospital active medication list Current Facility-Administered Medications  Medication Dose Route Frequency Provider Last Rate Last Admin   acetaminophen (TYLENOL) tablet 650 mg  650 mg Oral Q6H PRN Wieting, Richard, MD       Or   acetaminophen (TYLENOL) suppository 650 mg  650 mg Rectal Q6H PRN Wieting, Richard, MD       ceFAZolin (ANCEF) IVPB 2g/100 mL premix  2 g Intravenous Q8H Wieting, Richard, MD 200 mL/hr at 08/04/21 0818 2 g at 08/04/21 0818   feeding supplement (ENSURE ENLIVE / ENSURE PLUS) liquid 237 mL  237 mL Oral Q24H Ghimire, Shanker M, MD   237 mL at 08/03/21 1506   magnesium sulfate IVPB 4 g 100 mL  4 g Intravenous Once Ghimire, Werner Lean, MD       midodrine (PROAMATINE) tablet 5 mg  5 mg Oral TID WC Maretta Bees, MD   5 mg at 08/04/21 0816   multivitamin with minerals tablet 1 tablet  1 tablet Oral Daily Maretta Bees, MD   1 tablet at 08/04/21 0815   ondansetron (ZOFRAN) tablet 4 mg  4 mg Oral Q6H PRN Alford Highland, MD       Or   ondansetron [facility]) injection  4 mg  4 mg Intravenous Q6H PRN Alford Highland, MD       oxyCODONE (Oxy IR/ROXICODONE) immediate release tablet 5 mg  5 mg Oral Q4H PRN Alford Highland, MD   5 mg at 08/03/21 1926   spironolactone (ALDACTONE) tablet 50 mg  50 mg Oral Daily Maretta Bees, MD   50 mg at 08/04/21 0923     Discharge Medications: Please see discharge summary for a list of discharge medications.  Relevant Imaging Results:  Relevant Lab Results:   Additional Information SS# [id number]  Allayne Butcher, RN

## 2021-08-05 DIAGNOSIS — R6520 Severe sepsis without septic shock: Secondary | ICD-10-CM | POA: Diagnosis not present

## 2021-08-05 DIAGNOSIS — A419 Sepsis, unspecified organism: Secondary | ICD-10-CM | POA: Diagnosis not present

## 2021-08-05 DIAGNOSIS — Z5900 Homelessness unspecified: Secondary | ICD-10-CM | POA: Diagnosis not present

## 2021-08-05 DIAGNOSIS — L03114 Cellulitis of left upper limb: Secondary | ICD-10-CM | POA: Diagnosis not present

## 2021-08-05 LAB — BASIC METABOLIC PANEL
Anion gap: 5 (ref 5–15)
BUN: 12 mg/dL (ref 8–23)
CO2: 22 mmol/L (ref 22–32)
Calcium: 7.4 mg/dL — ABNORMAL LOW (ref 8.9–10.3)
Chloride: 107 mmol/L (ref 98–111)
Creatinine, Ser: 0.8 mg/dL (ref 0.61–1.24)
GFR, Estimated: >60 mL/min (ref >60)
Glucose, Bld: 115 mg/dL — ABNORMAL HIGH (ref 70–99)
Potassium: 3.5 mmol/L (ref 3.5–5.1)
Sodium: 134 mmol/L — ABNORMAL LOW (ref 135–145)

## 2021-08-05 LAB — MAGNESIUM: Magnesium: 1.8 mg/dL (ref 1.7–2.4)

## 2021-08-05 MED ORDER — MIDODRINE HCL 5 MG PO TABS
10.0000 mg | ORAL_TABLET | Freq: Three times a day (TID) | ORAL | Status: DC
  Administered 2021-08-05 – 2021-08-07 (×5): 10 mg via ORAL
  Filled 2021-08-05 (×5): qty 2

## 2021-08-05 MED ORDER — FUROSEMIDE 20 MG PO TABS
20.0000 mg | ORAL_TABLET | Freq: Every day | ORAL | Status: DC
  Administered 2021-08-05 – 2021-08-07 (×3): 20 mg via ORAL
  Filled 2021-08-05 (×3): qty 1

## 2021-08-05 NOTE — Progress Notes (Signed)
PROGRESS NOTE        PATIENT DETAILS Name: Johnny Dunlap Age: 71 y.o. Sex: male Date of Birth: 05-Feb-1950 Admit Date: 08/01/2021 Admitting Physician Alford Highlandichard Wieting, MD PCP:Pcp, No  Brief Narrative: Patient is a 71 y.o. male with history of liver cirrhosis, COPD, recent COVID 19 infection-presenting with left forearm cellulitis.  Significant events: 8/2>> admit for left forearm soft tissue infection  Significant studies: 8/2>> x-ray chest: No pneumonia-small right pleural effusion 8/2>> x-ray left elbow/left wrist: Diffuse soft tissue swelling 8/3>> x-ray left forearm: Diffuse soft tissue swelling  Antimicrobial therapy: Ancef: 8/3>>  Microbiology data: 8/2>> blood culture 1/2: Staph hominis  Procedures : None  Consults: Orthopedics  DVT Prophylaxis : SCDs Start: 08/01/21 1807 Place TED hose Start: 08/01/21 1807   Subjective: Left forearm swelling continues to improve.  Still has significant lower extremity edema.  Assessment/Plan: Sepsis due to left forearm cellulitis: Sepsis physiology has improved-erythema/swelling in left upper extremity has significantly improved over the past several days-plan is to continue IV Ancef for another day before switching to oral antimicrobial therapy.  Continue to encourage elevation of left upper extremity   blood cultures-1/2 positive for staph hominis-highly suspicious for a contamination.    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  Reassess on 8/7.  Recent COVID-19 infection: Currently asymptomatic-probably can come off isolation as of 8/4  Hypokalemia/hypomagnesemia: Repleted-follow periodically.  Homelessness: will need CM/SW involvement prior to discharge.   Diet: Diet Order              Diet regular Room service appropriate? Yes; Fluid consistency: Thin  Diet effective now                    Code Status: Full code   Family Communication: None at bedside  Disposition Plan: Status is: Inpatient  Remains inpatient appropriate because:Inpatient level of care appropriate due to severity of illness  Dispo: The patient is from: Home              Anticipated d/c is to: Home              Patient currently is not medically stable to d/c.   Difficult to place patient No   Barriers to Discharge: Left forearm soft tissue infection on IV antibiotics.  Antimicrobial agents: Anti-infectives (From admission, onward)    Start     Dose/Rate Route Frequency Ordered Stop   08/02/21 1600  ceFAZolin (ANCEF) IVPB 2g/100 mL premix        2 g 200 mL/hr over 30 Minutes Intravenous Every 8 hours 08/01/21 1820     08/01/21 2200  ceFAZolin (ANCEF) IVPB 2g/100 mL premix  Status:  Discontinued        2 g 200 mL/hr over 30 Minutes Intravenous Every 8 hours 08/01/21 1804 08/01/21 1820   08/01/21 1500  vancomycin (VANCOCIN) IVPB 1000 mg/200 mL premix        1,000 mg 200 mL/hr over 60 Minutes Intravenous  Once 08/01/21 1449 08/01/21 1730   08/01/21 1500  cefTRIAXone (ROCEPHIN) 2 g in sodium chloride 0.9 % 100 mL IVPB        2 g 200 mL/hr over 30 Minutes Intravenous  Once 08/01/21 1449 08/01/21 1613  Time spent: 25 minutes-Greater than 50% of this time was spent in counseling, explanation of diagnosis, planning of further management, and coordination of care.  MEDICATIONS: Scheduled Meds:  feeding supplement  237 mL Oral Q24H   furosemide  20 mg Oral Daily   midodrine  5 mg Oral TID WC   multivitamin with minerals  1 tablet Oral Daily   spironolactone  50 mg Oral Daily   Continuous Infusions:   ceFAZolin (ANCEF) IV 2 g (08/05/21 0013)   PRN Meds:.acetaminophen **OR** acetaminophen, albuterol, ondansetron **OR** ondansetron (ZOFRAN) IV, oxyCODONE   PHYSICAL  EXAM: Vital signs: Vitals:   08/05/21 0801 08/05/21 0804 08/05/21 1235 08/05/21 1237  BP: (!) 99/47 (!) 107/40 (!) 88/52 (!) 87/44  Pulse: 67 64 62 67  Resp: 19  17   Temp: 97.8 F (36.6 C)  98 F (36.7 C)   TempSrc:      SpO2: 97%  97%   Weight:      Height:       Filed Weights   08/01/21 1613  Weight: 67.1 kg   Body mass index is 20.07 kg/m.   Gen Exam:Alert awake-not in any distress HEENT:atraumatic, normocephalic Chest: B/L clear to auscultation anteriorly CVS:S1S2 regular Abdomen:soft non tender, non distended Extremities: 2+ pitting edema lower extremities.  Left upper extremity-still with persistent brawny induration in left forearm area-some mild erythema persists. Neurology: Non focal Skin: no rash   I have personally reviewed following labs and imaging studies  LABORATORY DATA: CBC: Recent Labs  Lab 08/01/21 1503 08/02/21 0506 08/03/21 0507  WBC 9.4 6.1 5.1  NEUTROABS 7.8*  --   --   HGB 8.6* 7.9* 7.5*  HCT 26.6* 24.5* 23.2*  MCV 87.5 88.1 87.2  PLT 45* 40* 42*     Basic Metabolic Panel: Recent Labs  Lab 08/01/21 1503 08/02/21 0506 08/03/21 0507 08/04/21 0432 08/05/21 0553  NA 136 136 137 135 134*  K 3.3* 3.1* 3.0* 3.4* 3.5  CL 108 110 108 108 107  CO2 19* 21* 24 24 22   GLUCOSE 107* 94 96 89 115*  BUN 16 15 15 13 12   CREATININE 1.02 0.83 0.87 0.81 0.80  CALCIUM 7.8* 7.8* 7.5* 7.4* 7.4*  MG  --   --   --  1.6* 1.8     GFR: Estimated Creatinine Clearance: 83.9 mL/min (by C-G formula based on SCr of 0.8 mg/dL).  Liver Function Tests: Recent Labs  Lab 08/01/21 1503 08/03/21 0507  AST 63* 44*  ALT 25 19  ALKPHOS 107 115  BILITOT 8.7* 3.8*  PROT 5.8* 4.8*  ALBUMIN 2.5* 1.8*    No results for input(s): LIPASE, AMYLASE in the last 168 hours. No results for input(s): AMMONIA in the last 168 hours.  Coagulation Profile: Recent Labs  Lab 08/01/21 1503  INR 2.1*     Cardiac Enzymes: Recent Labs  Lab 08/01/21 1603   CKTOTAL 386     BNP (last 3 results) No results for input(s): PROBNP in the last 8760 hours.  Lipid Profile: No results for input(s): CHOL, HDL, LDLCALC, TRIG, CHOLHDL, LDLDIRECT in the last 72 hours.  Thyroid Function Tests: No results for input(s): TSH, T4TOTAL, FREET4, T3FREE, THYROIDAB in the last 72 hours.  Anemia Panel: No results for input(s): VITAMINB12, FOLATE, FERRITIN, TIBC, IRON, RETICCTPCT in the last 72 hours.  Urine analysis: No results found for: COLORURINE, APPEARANCEUR, LABSPEC, PHURINE, GLUCOSEU, HGBUR, BILIRUBINUR, KETONESUR, PROTEINUR, UROBILINOGEN, NITRITE, LEUKOCYTESUR  Sepsis Labs: Lactic Acid, Venous    Component  Value Date/Time   LATICACIDVEN 1.8 08/02/2021 0506    MICROBIOLOGY: Recent Results (from the past 240 hour(s))  Resp Panel by RT-PCR (Flu A&B, Covid) Nasopharyngeal Swab     Status: Abnormal   Collection Time: 08/01/21  3:03 PM   Specimen: Nasopharyngeal Swab; Nasopharyngeal(NP) swabs in vial transport medium  Result Value Ref Range Status   SARS Coronavirus 2 by RT PCR POSITIVE (A) NEGATIVE Final    Comment: RESULT CALLED TO, READ BACK BY AND VERIFIED WITH: KARA BRIDGES 08/01/21 1624 KLW (NOTE) SARS-CoV-2 target nucleic acids are DETECTED.  The SARS-CoV-2 RNA is generally detectable in upper respiratory specimens during the acute phase of infection. Positive results are indicative of the presence of the identified virus, but do not rule out bacterial infection or co-infection with other pathogens not detected by the test. Clinical correlation with patient history and other diagnostic information is necessary to determine patient infection status. The expected result is Negative.  Fact Sheet for Patients: BloggerCourse.com  Fact Sheet for Healthcare Providers: SeriousBroker.it  This test is not yet approved or cleared by the [place] FDA and  has been authorized for detection  and/or diagnosis of SARS-CoV-2 by FDA under an Emergency Use Authorization (EUA).  This EUA will remain in effect (meaning this test can be used ) for the duration of  the COVID-19 declaration under Section 564(b)(1) of the Act, 21 U.S.C. section 360bbb-3(b)(1), unless the authorization is terminated or revoked sooner.     Influenza A by PCR NEGATIVE NEGATIVE Final   Influenza B by PCR NEGATIVE NEGATIVE Final    Comment: (NOTE) The Xpert Xpress SARS-CoV-2/FLU/RSV plus assay is intended as an aid in the diagnosis of influenza from Nasopharyngeal swab specimens and should not be used as a sole basis for treatment. Nasal washings and aspirates are unacceptable for Xpert Xpress SARS-CoV-2/FLU/RSV testing.  Fact Sheet for Patients: BloggerCourse.com  Fact Sheet for Healthcare Providers: SeriousBroker.it  This test is not yet approved or cleared by the [place] FDA and has been authorized for detection and/or diagnosis of SARS-CoV-2 by FDA under an Emergency Use Authorization (EUA). This EUA will remain in effect (meaning this test can be used) for the duration of the COVID-19 declaration under Section 564(b)(1) of the Act, 21 U.S.C. section 360bbb-3(b)(1), unless the authorization is terminated or revoked.  Performed at [facility], [address] [place], [place] [address]   Blood Culture (routine x 2)     Status: Abnormal   Collection Time: 08/01/21  3:03 PM   Specimen: BLOOD  Result Value Ref Range Status   Specimen Description   Final    BLOOD BLOOD RIGHT FOREARM Performed at [facility], [address] [place], [place] [address]    Special Requests   Final    BOTTLES DRAWN AEROBIC AND ANAEROBIC Blood Culture adequate volume Performed at [facility], [address], [place], [place] [address]    Culture  Setup Time   Final    GRAM POSITIVE COCCI ANAEROBIC BOTTLE ONLY CRITICAL  RESULT CALLED TO, READ BACK BY AND VERIFIED WITH: DEVIN MITCHELL @0835  08/02/21 SCS    Culture (A)  Final    STAPHYLOCOCCUS HOMINIS THE SIGNIFICANCE OF ISOLATING THIS ORGANISM FROM A SINGLE SET OF BLOOD CULTURES WHEN MULTIPLE SETS ARE DRAWN IS UNCERTAIN. PLEASE NOTIFY THE MICROBIOLOGY DEPARTMENT WITHIN ONE WEEK IF SPECIATION AND SENSITIVITIES ARE REQUIRED. Performed at [facility], [address] [place], [place]    Report Status 08/04/2021  FINAL  Final  Blood Culture (routine x 2)     Status: None (Preliminary result)   Collection Time: 08/01/21  3:03 PM   Specimen: BLOOD  Result Value Ref Range Status   Specimen Description BLOOD RIGHT ANTECUBITAL  Final   Special Requests   Final    BOTTLES DRAWN AEROBIC AND ANAEROBIC Blood Culture adequate volume   Culture   Final    NO GROWTH 4 DAYS Performed at [facility], [address] [place], [place] [address]    Report Status PENDING  Incomplete  Blood Culture ID Panel (Reflexed)     Status: Abnormal   Collection Time: 08/01/21  3:03 PM  Result Value Ref Range Status   Enterococcus faecalis NOT DETECTED NOT DETECTED Final   Enterococcus Faecium NOT DETECTED NOT DETECTED Final   Listeria monocytogenes NOT DETECTED NOT DETECTED Final   Staphylococcus species DETECTED (A) NOT DETECTED Final    Comment: CRITICAL RESULT CALLED TO, READ BACK BY AND VERIFIED WITH: DEVIN MITCHELL @0835  08/02/21 SCS    Staphylococcus aureus (BCID) NOT DETECTED NOT DETECTED Final   Staphylococcus epidermidis NOT DETECTED NOT DETECTED Final   Staphylococcus lugdunensis NOT DETECTED NOT DETECTED Final   Streptococcus species NOT DETECTED NOT DETECTED Final   Streptococcus agalactiae NOT DETECTED NOT DETECTED Final   Streptococcus pneumoniae NOT DETECTED NOT DETECTED Final   Streptococcus pyogenes NOT DETECTED NOT DETECTED Final   A.calcoaceticus-baumannii NOT DETECTED NOT DETECTED Final   Bacteroides fragilis NOT DETECTED NOT DETECTED  Final   Enterobacterales NOT DETECTED NOT DETECTED Final   Enterobacter cloacae complex NOT DETECTED NOT DETECTED Final   Escherichia coli NOT DETECTED NOT DETECTED Final   Klebsiella aerogenes NOT DETECTED NOT DETECTED Final   Klebsiella oxytoca NOT DETECTED NOT DETECTED Final   Klebsiella pneumoniae NOT DETECTED NOT DETECTED Final   Proteus species NOT DETECTED NOT DETECTED Final   Salmonella species NOT DETECTED NOT DETECTED Final   Serratia marcescens NOT DETECTED NOT DETECTED Final   Haemophilus influenzae NOT DETECTED NOT DETECTED Final   Neisseria meningitidis NOT DETECTED NOT DETECTED Final   Pseudomonas aeruginosa NOT DETECTED NOT DETECTED Final   Stenotrophomonas maltophilia NOT DETECTED NOT DETECTED Final   Candida albicans NOT DETECTED NOT DETECTED Final   Candida auris NOT DETECTED NOT DETECTED Final   Candida glabrata NOT DETECTED NOT DETECTED Final   Candida krusei NOT DETECTED NOT DETECTED Final   Candida parapsilosis NOT DETECTED NOT DETECTED Final   Candida tropicalis NOT DETECTED NOT DETECTED Final   Cryptococcus neoformans/gattii NOT DETECTED NOT DETECTED Final    Comment: Performed at [facility], [address], [place], [place]    RADIOLOGY STUDIES/RESULTS: No results found.   LOS: 4 days   [id number], MD  [facility]    To contact the attending provider between 7A-7P or the covering provider during after hours 7P-7A, please log into the web site [email or web address] and access using universal [place] password for that web site. If you do not have the password, please call the hospital operator.  08/05/2021, 1:10 PM

## 2021-08-06 DIAGNOSIS — Z5900 Homelessness unspecified: Secondary | ICD-10-CM | POA: Diagnosis not present

## 2021-08-06 DIAGNOSIS — R6520 Severe sepsis without septic shock: Secondary | ICD-10-CM | POA: Diagnosis not present

## 2021-08-06 DIAGNOSIS — L03114 Cellulitis of left upper limb: Secondary | ICD-10-CM | POA: Diagnosis not present

## 2021-08-06 DIAGNOSIS — A419 Sepsis, unspecified organism: Secondary | ICD-10-CM | POA: Diagnosis not present

## 2021-08-06 LAB — BASIC METABOLIC PANEL
Anion gap: 4 — ABNORMAL LOW (ref 5–15)
BUN: 11 mg/dL (ref 8–23)
CO2: 24 mmol/L (ref 22–32)
Calcium: 7.3 mg/dL — ABNORMAL LOW (ref 8.9–10.3)
Chloride: 108 mmol/L (ref 98–111)
Creatinine, Ser: 0.7 mg/dL (ref 0.61–1.24)
GFR, Estimated: >60 mL/min (ref >60)
Glucose, Bld: 84 mg/dL (ref 70–99)
Potassium: 3.5 mmol/L (ref 3.5–5.1)
Sodium: 136 mmol/L (ref 135–145)

## 2021-08-06 LAB — CULTURE, BLOOD (ROUTINE X 2)
Culture: NO GROWTH
Special Requests: ADEQUATE

## 2021-08-06 LAB — MAGNESIUM: Magnesium: 1.7 mg/dL (ref 1.7–2.4)

## 2021-08-06 MED ORDER — CEPHALEXIN 500 MG PO CAPS
500.0000 mg | ORAL_CAPSULE | Freq: Three times a day (TID) | ORAL | Status: AC
  Administered 2021-08-06 – 2021-08-09 (×9): 500 mg via ORAL
  Filled 2021-08-06 (×9): qty 1

## 2021-08-06 MED ORDER — SPIRONOLACTONE 25 MG PO TABS
75.0000 mg | ORAL_TABLET | Freq: Every day | ORAL | Status: DC
  Administered 2021-08-06 – 2021-08-07 (×2): 75 mg via ORAL
  Filled 2021-08-06 (×2): qty 3

## 2021-08-06 MED ORDER — MAGNESIUM SULFATE 2 GM/50ML IV SOLN
2.0000 g | Freq: Once | INTRAVENOUS | Status: AC
  Administered 2021-08-06: 2 g via INTRAVENOUS
  Filled 2021-08-06: qty 50

## 2021-08-06 MED ORDER — POTASSIUM CHLORIDE CRYS ER 20 MEQ PO TBCR
20.0000 meq | EXTENDED_RELEASE_TABLET | Freq: Once | ORAL | Status: AC
  Administered 2021-08-06: 09:00:00 20 meq via ORAL
  Filled 2021-08-06: qty 1

## 2021-08-06 MED ORDER — SPIRONOLACTONE 25 MG PO TABS: 100.0000 mg | ORAL_TABLET | Freq: Every day | ORAL | Status: DC

## 2021-08-06 NOTE — Progress Notes (Signed)
CCMD called with tele report:  [id number] with PVCs, PACs, infrequent runs of Vtach  -nothing follows

## 2021-08-06 NOTE — Progress Notes (Signed)
PROGRESS NOTE        PATIENT DETAILS Name: Johnny Dunlap Age: 71 y.o. Sex: male Date of Birth: 08-07-1950 Admit Date: 08/01/2021 Admitting Physician Alford Highlandichard Wieting, MD PCP:Pcp, No  Brief Narrative: Patient is a 71 y.o. male with history of liver cirrhosis, COPD, recent COVID 19 infection-presenting with left forearm cellulitis.  Significant events: 8/2>> admit for left forearm soft tissue infection  Significant studies: 8/2>> x-ray chest: No pneumonia-small right pleural effusion 8/2>> x-ray left elbow/left wrist: Diffuse soft tissue swelling 8/3>> x-ray left forearm: Diffuse soft tissue swelling  Antimicrobial therapy: Ancef: 8/3>>8/7 Keflex: 8/7  Microbiology data: 8/2>> blood culture 1/2: Staph hominis  Procedures : None  Consults: Orthopedics  DVT Prophylaxis : SCDs Start: 08/01/21 1807 Place TED hose Start: 08/01/21 1807   Subjective: Some mild erythema/swelling/tenderness persists in the medial aspect of proximal forearm.  But overall much better.  Continues to have swelling in his lower extremities.  Assessment/Plan: Sepsis due to left forearm cellulitis: Sepsis physiology has improved-minimal erythema/swelling in the left proximal forearm (medial aspect).  Stop IV Ancef-transition to Keflex today.  1/2 blood cultures positive for staph hominis which is probably a contamination and of no clinical significance.    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    Recent COVID-19 infection: Currently asymptomatic-probably can come off isolation as of 8/4  Hypokalemia/hypomagnesemia: Repleted-follow periodically.  Homelessness: will need CM/SW involvement prior to discharge.   Diet: Diet Order             Diet regular Room service  appropriate? Yes; Fluid consistency: Thin  Diet effective now                    Code Status: Full code   Family Communication: None at bedside  Disposition Plan: Status is: Inpatient  Remains inpatient appropriate because:Inpatient level of care appropriate due to severity of illness  Dispo: The patient is from: Home              Anticipated d/c is to: Home              Patient currently is not medically stable to d/c.   Difficult to place patient No   Barriers to Discharge: Awaiting SNF placement  Antimicrobial agents: Anti-infectives (From admission, onward)    Start     Dose/Rate Route Frequency Ordered Stop   08/02/21 1600  ceFAZolin (ANCEF) IVPB 2g/100 mL premix        2 g 200 mL/hr over 30 Minutes Intravenous Every 8 hours 08/01/21 1820     08/01/21 2200  ceFAZolin (ANCEF) IVPB 2g/100 mL premix  Status:  Discontinued        2 g 200 mL/hr over 30 Minutes Intravenous Every 8 hours 08/01/21 1804 08/01/21 1820   08/01/21 1500  vancomycin (VANCOCIN) IVPB 1000 mg/200 mL premix        1,000 mg 200 mL/hr over 60 Minutes Intravenous  Once 08/01/21 1449 08/01/21 1730   08/01/21 1500  cefTRIAXone (ROCEPHIN) 2 g in sodium chloride 0.9 % 100 mL IVPB        2 g 200 mL/hr over 30 Minutes Intravenous  Once 08/01/21 1449 08/01/21 1613        Time spent: 25 minutes-Greater than 50% of this  time was spent in counseling, explanation of diagnosis, planning of further management, and coordination of care.  MEDICATIONS: Scheduled Meds:  feeding supplement  237 mL Oral Q24H   furosemide  20 mg Oral Daily   midodrine  10 mg Oral TID WC   multivitamin with minerals  1 tablet Oral Daily   spironolactone  75 mg Oral Daily   Continuous Infusions:   ceFAZolin (ANCEF) IV 2 g (08/06/21 0838)   PRN Meds:.acetaminophen **OR** acetaminophen, albuterol, ondansetron **OR** ondansetron (ZOFRAN) IV, oxyCODONE   PHYSICAL EXAM: Vital signs: Vitals:   08/05/21 1657 08/05/21 2012  08/06/21 0434 08/06/21 0723  BP: (!) 112/57 (!) 103/50 102/62 96/63  Pulse: (!) 55 73 70 68  Resp: 20 15 15 18   Temp: 98.4 F (36.9 C) 98.4 F (36.9 C) 98.2 F (36.8 C) 97.9 F (36.6 C)  TempSrc: Oral   Oral  SpO2: 94% 96% 93% 95%  Weight:      Height:       Filed Weights   08/01/21 1613  Weight: 67.1 kg   Body mass index is 20.07 kg/m.   Gen Exam:Alert awake-not in any distress HEENT:atraumatic, normocephalic Chest: B/L clear to auscultation anteriorly CVS:S1S2 regular Abdomen:soft non tender, distended with ascites but not tense. Extremities: Mild swelling/erythema in the medial aspect of proximal forearm.  2+ pitting edema bilateral lower extremities. Neurology: Non focal Skin: no rash   I have personally reviewed following labs and imaging studies  LABORATORY DATA: CBC: Recent Labs  Lab 08/01/21 1503 08/02/21 0506 08/03/21 0507  WBC 9.4 6.1 5.1  NEUTROABS 7.8*  --   --   HGB 8.6* 7.9* 7.5*  HCT 26.6* 24.5* 23.2*  MCV 87.5 88.1 87.2  PLT 45* 40* 42*     Basic Metabolic Panel: Recent Labs  Lab 08/02/21 0506 08/03/21 0507 08/04/21 0432 08/05/21 0553 08/06/21 0500  NA 136 137 135 134* 136  K 3.1* 3.0* 3.4* 3.5 3.5  CL 110 108 108 107 108  CO2 21* 24 24 22 24   GLUCOSE 94 96 89 115* 84  BUN 15 15 13 12 11   CREATININE 0.83 0.87 0.81 0.80 0.70  CALCIUM 7.8* 7.5* 7.4* 7.4* 7.3*  MG  --   --  1.6* 1.8 1.7     GFR: Estimated Creatinine Clearance: 83.9 mL/min (by C-G formula based on SCr of 0.7 mg/dL).  Liver Function Tests: Recent Labs  Lab 08/01/21 1503 08/03/21 0507  AST 63* 44*  ALT 25 19  ALKPHOS 107 115  BILITOT 8.7* 3.8*  PROT 5.8* 4.8*  ALBUMIN 2.5* 1.8*    No results for input(s): LIPASE, AMYLASE in the last 168 hours. No results for input(s): AMMONIA in the last 168 hours.  Coagulation Profile: Recent Labs  Lab 08/01/21 1503  INR 2.1*     Cardiac Enzymes: Recent Labs  Lab 08/01/21 1603  CKTOTAL 386     BNP (last  3 results) No results for input(s): PROBNP in the last 8760 hours.  Lipid Profile: No results for input(s): CHOL, HDL, LDLCALC, TRIG, CHOLHDL, LDLDIRECT in the last 72 hours.  Thyroid Function Tests: No results for input(s): TSH, T4TOTAL, FREET4, T3FREE, THYROIDAB in the last 72 hours.  Anemia Panel: No results for input(s): VITAMINB12, FOLATE, FERRITIN, TIBC, IRON, RETICCTPCT in the last 72 hours.  Urine analysis: No results found for: COLORURINE, APPEARANCEUR, LABSPEC, PHURINE, GLUCOSEU, HGBUR, BILIRUBINUR, KETONESUR, PROTEINUR, UROBILINOGEN, NITRITE, LEUKOCYTESUR  Sepsis Labs: Lactic Acid, Venous    Component Value Date/Time   LATICACIDVEN  1.8 08/02/2021 0506    MICROBIOLOGY: Recent Results (from the past 240 hour(s))  Resp Panel by RT-PCR (Flu A&B, Covid) Nasopharyngeal Swab     Status: Abnormal   Collection Time: 08/01/21  3:03 PM   Specimen: Nasopharyngeal Swab; Nasopharyngeal(NP) swabs in vial transport medium  Result Value Ref Range Status   SARS Coronavirus 2 by RT PCR POSITIVE (A) NEGATIVE Final    Comment: RESULT CALLED TO, READ BACK BY AND VERIFIED WITH: KARA BRIDGES 08/01/21 1624 KLW (NOTE) SARS-CoV-2 target nucleic acids are DETECTED.  The SARS-CoV-2 RNA is generally detectable in upper respiratory specimens during the acute phase of infection. Positive results are indicative of the presence of the identified virus, but do not rule out bacterial infection or co-infection with other pathogens not detected by the test. Clinical correlation with patient history and other diagnostic information is necessary to determine patient infection status. The expected result is Negative.  Fact Sheet for Patients: BloggerCourse.com  Fact Sheet for Healthcare Providers: SeriousBroker.it  This test is not yet approved or cleared by the [place] FDA and  has been authorized for detection and/or diagnosis of SARS-CoV-2  by FDA under an Emergency Use Authorization (EUA).  This EUA will remain in effect (meaning this test can be used ) for the duration of  the COVID-19 declaration under Section 564(b)(1) of the Act, 21 U.S.C. section 360bbb-3(b)(1), unless the authorization is terminated or revoked sooner.     Influenza A by PCR NEGATIVE NEGATIVE Final   Influenza B by PCR NEGATIVE NEGATIVE Final    Comment: (NOTE) The Xpert Xpress SARS-CoV-2/FLU/RSV plus assay is intended as an aid in the diagnosis of influenza from Nasopharyngeal swab specimens and should not be used as a sole basis for treatment. Nasal washings and aspirates are unacceptable for Xpert Xpress SARS-CoV-2/FLU/RSV testing.  Fact Sheet for Patients: BloggerCourse.com  Fact Sheet for Healthcare Providers: SeriousBroker.it  This test is not yet approved or cleared by the [place] FDA and has been authorized for detection and/or diagnosis of SARS-CoV-2 by FDA under an Emergency Use Authorization (EUA). This EUA will remain in effect (meaning this test can be used) for the duration of the COVID-19 declaration under Section 564(b)(1) of the Act, 21 U.S.C. section 360bbb-3(b)(1), unless the authorization is terminated or revoked.  Performed at [facility], [address], [place], [place] [address]   Blood Culture (routine x 2)     Status: Abnormal   Collection Time: 08/01/21  3:03 PM   Specimen: BLOOD  Result Value Ref Range Status   Specimen Description   Final    BLOOD BLOOD RIGHT FOREARM Performed at [facility], [address], [place], [place] [address]    Special Requests   Final    BOTTLES DRAWN AEROBIC AND ANAEROBIC Blood Culture adequate volume Performed at [facility], [address], [place], [place] [address]    Culture  Setup Time   Final    GRAM POSITIVE COCCI ANAEROBIC BOTTLE ONLY CRITICAL RESULT CALLED TO, READ BACK BY AND  VERIFIED WITH: DEVIN MITCHELL @0835  08/02/21 SCS    Culture (A)  Final    STAPHYLOCOCCUS HOMINIS THE SIGNIFICANCE OF ISOLATING THIS ORGANISM FROM A SINGLE SET OF BLOOD CULTURES WHEN MULTIPLE SETS ARE DRAWN IS UNCERTAIN. PLEASE NOTIFY THE MICROBIOLOGY DEPARTMENT WITHIN ONE WEEK IF SPECIATION AND SENSITIVITIES ARE REQUIRED. Performed at [facility], [address], [place], [place]    Report Status 08/04/2021 FINAL  Final  Blood  Culture (routine x 2)     Status: None   Collection Time: 08/01/21  3:03 PM   Specimen: BLOOD  Result Value Ref Range Status   Specimen Description BLOOD RIGHT ANTECUBITAL  Final   Special Requests   Final    BOTTLES DRAWN AEROBIC AND ANAEROBIC Blood Culture adequate volume   Culture   Final    NO GROWTH 5 DAYS Performed at [facility], [address] [place], [place] [address]    Report Status 08/06/2021 FINAL  Final  Blood Culture ID Panel (Reflexed)     Status: Abnormal   Collection Time: 08/01/21  3:03 PM  Result Value Ref Range Status   Enterococcus faecalis NOT DETECTED NOT DETECTED Final   Enterococcus Faecium NOT DETECTED NOT DETECTED Final   Listeria monocytogenes NOT DETECTED NOT DETECTED Final   Staphylococcus species DETECTED (A) NOT DETECTED Final    Comment: CRITICAL RESULT CALLED TO, READ BACK BY AND VERIFIED WITH: DEVIN MITCHELL @0835  08/02/21 SCS    Staphylococcus aureus (BCID) NOT DETECTED NOT DETECTED Final   Staphylococcus epidermidis NOT DETECTED NOT DETECTED Final   Staphylococcus lugdunensis NOT DETECTED NOT DETECTED Final   Streptococcus species NOT DETECTED NOT DETECTED Final   Streptococcus agalactiae NOT DETECTED NOT DETECTED Final   Streptococcus pneumoniae NOT DETECTED NOT DETECTED Final   Streptococcus pyogenes NOT DETECTED NOT DETECTED Final   A.calcoaceticus-baumannii NOT DETECTED NOT DETECTED Final   Bacteroides fragilis NOT DETECTED NOT DETECTED Final   Enterobacterales NOT DETECTED NOT DETECTED  Final   Enterobacter cloacae complex NOT DETECTED NOT DETECTED Final   Escherichia coli NOT DETECTED NOT DETECTED Final   Klebsiella aerogenes NOT DETECTED NOT DETECTED Final   Klebsiella oxytoca NOT DETECTED NOT DETECTED Final   Klebsiella pneumoniae NOT DETECTED NOT DETECTED Final   Proteus species NOT DETECTED NOT DETECTED Final   Salmonella species NOT DETECTED NOT DETECTED Final   Serratia marcescens NOT DETECTED NOT DETECTED Final   Haemophilus influenzae NOT DETECTED NOT DETECTED Final   Neisseria meningitidis NOT DETECTED NOT DETECTED Final   Pseudomonas aeruginosa NOT DETECTED NOT DETECTED Final   Stenotrophomonas maltophilia NOT DETECTED NOT DETECTED Final   Candida albicans NOT DETECTED NOT DETECTED Final   Candida auris NOT DETECTED NOT DETECTED Final   Candida glabrata NOT DETECTED NOT DETECTED Final   Candida krusei NOT DETECTED NOT DETECTED Final   Candida parapsilosis NOT DETECTED NOT DETECTED Final   Candida tropicalis NOT DETECTED NOT DETECTED Final   Cryptococcus neoformans/gattii NOT DETECTED NOT DETECTED Final    Comment: Performed at [facility], [address], [place], [place]    RADIOLOGY STUDIES/RESULTS: No results found.   LOS: 5 days   [id number], MD  [facility]    To contact the attending provider between 7A-7P or the covering provider during after hours 7P-7A, please log into the web site [email or web address] and access using universal [place] password for that web site. If you do not have the password, please call the hospital operator.  08/06/2021, 1:22 PM

## 2021-08-07 DIAGNOSIS — L03114 Cellulitis of left upper limb: Secondary | ICD-10-CM | POA: Diagnosis not present

## 2021-08-07 LAB — BASIC METABOLIC PANEL
Anion gap: 3 — ABNORMAL LOW (ref 5–15)
BUN: 19 mg/dL (ref 8–23)
CO2: 25 mmol/L (ref 22–32)
Calcium: 7.8 mg/dL — ABNORMAL LOW (ref 8.9–10.3)
Chloride: 109 mmol/L (ref 98–111)
Creatinine, Ser: 0.83 mg/dL (ref 0.61–1.24)
GFR, Estimated: >60 mL/min (ref >60)
Glucose, Bld: 85 mg/dL (ref 70–99)
Potassium: 4 mmol/L (ref 3.5–5.1)
Sodium: 137 mmol/L (ref 135–145)

## 2021-08-07 LAB — MAGNESIUM: Magnesium: 1.7 mg/dL (ref 1.7–2.4)

## 2021-08-07 MED ORDER — FUROSEMIDE 40 MG PO TABS
40.0000 mg | ORAL_TABLET | Freq: Every day | ORAL | Status: DC
  Administered 2021-08-08: 10:00:00 40 mg via ORAL
  Filled 2021-08-07 (×4): qty 1

## 2021-08-07 MED ORDER — MAGNESIUM SULFATE 2 GM/50ML IV SOLN
2.0000 g | Freq: Once | INTRAVENOUS | Status: AC
  Administered 2021-08-07: 08:00:00 2 g via INTRAVENOUS
  Filled 2021-08-07: qty 50

## 2021-08-07 NOTE — Progress Notes (Signed)
PROGRESS NOTE        PATIENT DETAILS Name: Johnny Dunlap Age: 71 y.o. Sex: male Date of Birth: 1950/05/04 Admit Date: 08/01/2021 Admitting Physician Alford Highland, MD PCP:Pcp, No  Brief Narrative: Patient is a 71 y.o. male with history of liver cirrhosis, COPD, recent COVID 19 infection-presenting with left forearm cellulitis.  Significant events: 8/2>> admit for left forearm soft tissue infection  Significant studies: 8/2>> x-ray chest: No pneumonia-small right pleural effusion 8/2>> x-ray left elbow/left wrist: Diffuse soft tissue swelling 8/3>> x-ray left forearm: Diffuse soft tissue swelling  Antimicrobial therapy: Ancef: 8/3>>8/7 Keflex: 8/7>>  Microbiology data: 8/2>> blood culture 1/2: Staph hominis  Procedures : None  Consults: Orthopedics  DVT Prophylaxis : SCDs Start: 08/01/21 1807 Place TED hose Start: 08/01/21 1807   Subjective: Lying comfortably in bed-has very mild swelling/erythema remaining in the medial aspect of his proximal left forearm.  Unchanged  lower extremity edema.  Assessment/Plan: Sepsis due to left forearm cellulitis: Sepsis physiology has improved-minimal erythema/swelling in the left proximal forearm (medial aspect).No Ancef-has been transitioned to Keflex-we will plan on a total of 7 days of antimicrobial therapy.  1/2 blood cultures positive for staph hominis which is probably a contamination and of no clinical significance.    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    Recent COVID-19 infection: Currently asymptomatic-probably can come off isolation as of 8/4  Hypokalemia/hypomagnesemia: Repleted-follow periodically.  Homelessness: will need CM/SW involvement prior to discharge.   Diet: Diet Order              Diet regular Room service appropriate? Yes; Fluid consistency: Thin  Diet effective now                    Code Status: Full code   Family Communication: None at bedside  Disposition Plan: Status is: Inpatient  Remains inpatient appropriate because:Inpatient level of care appropriate due to severity of illness  Dispo: The patient is from: Home              Anticipated d/c is to: Home              Patient currently is medically stable to d/c.   Difficult to place patient No   Barriers to Discharge: Awaiting SNF placement  Antimicrobial agents: Anti-infectives (From admission, onward)    Start     Dose/Rate Route Frequency Ordered Stop   08/06/21 1415  cephALEXin (KEFLEX) capsule 500 mg        500 mg Oral Every 8 hours 08/06/21 1324     08/02/21 1600  ceFAZolin (ANCEF) IVPB 2g/100 mL premix  Status:  Discontinued        2 g 200 mL/hr over 30 Minutes Intravenous Every 8 hours 08/01/21 1820 08/06/21 1324   08/01/21 2200  ceFAZolin (ANCEF) IVPB 2g/100 mL premix  Status:  Discontinued        2 g 200 mL/hr over 30 Minutes Intravenous Every 8 hours 08/01/21 1804 08/01/21 1820   08/01/21 1500  vancomycin (VANCOCIN) IVPB 1000 mg/200 mL premix        1,000 mg 200 mL/hr over 60 Minutes Intravenous  Once 08/01/21 1449 08/01/21 1730   08/01/21 1500  cefTRIAXone (ROCEPHIN) 2 g in sodium chloride 0.9 % 100 mL  IVPB        2 g 200 mL/hr over 30 Minutes Intravenous  Once 08/01/21 1449 08/01/21 1613        Time spent: 15 minutes-Greater than 50% of this time was spent in counseling, explanation of diagnosis, planning of further management, and coordination of care.  MEDICATIONS: Scheduled Meds:  cephALEXin  500 mg Oral Q8H   feeding supplement  237 mL Oral Q24H   furosemide  20 mg Oral Daily   midodrine  10 mg Oral TID WC   multivitamin with minerals  1 tablet Oral Daily   spironolactone  75 mg Oral Daily   Continuous Infusions:   PRN Meds:.acetaminophen  **OR** acetaminophen, albuterol, ondansetron **OR** ondansetron (ZOFRAN) IV, oxyCODONE   PHYSICAL EXAM: Vital signs: Vitals:   08/06/21 2005 08/06/21 2347 08/07/21 0625 08/07/21 0820  BP: (!) 102/58 (!) 100/49 (!) 97/56 (!) 120/50  Pulse: (!) 58 71 70 (!) 53  Resp: 18 18 18 20   Temp: 97.8 F (36.6 C) 98.1 F (36.7 C) (!) 97.5 F (36.4 C) 97.6 F (36.4 C)  TempSrc: Oral Oral Oral   SpO2: 97% 94% 97% 95%  Weight:      Height:       Filed Weights   08/01/21 1613  Weight: 67.1 kg   Body mass index is 20.07 kg/m.   Gen Exam:Alert awake-not in any distress HEENT:atraumatic, normocephalic Chest: B/L clear to auscultation anteriorly CVS:S1S2 regular Abdomen:soft non tender, distended with ascites but not tense. Extremities: Mild swelling/erythema in the medial aspect of proximal forearm.  2+ pitting edema bilateral lower extremities. Neurology: Non focal Skin: no rash   I have personally reviewed following labs and imaging studies  LABORATORY DATA: CBC: Recent Labs  Lab 08/01/21 1503 08/02/21 0506 08/03/21 0507  WBC 9.4 6.1 5.1  NEUTROABS 7.8*  --   --   HGB 8.6* 7.9* 7.5*  HCT 26.6* 24.5* 23.2*  MCV 87.5 88.1 87.2  PLT 45* 40* 42*     Basic Metabolic Panel: Recent Labs  Lab 08/03/21 0507 08/04/21 0432 08/05/21 0553 08/06/21 0500 08/07/21 0430  NA 137 135 134* 136 137  K 3.0* 3.4* 3.5 3.5 4.0  CL 108 108 107 108 109  CO2 24 24 22 24 25   GLUCOSE 96 89 115* 84 85  BUN 15 13 12 11 19   CREATININE 0.87 0.81 0.80 0.70 0.83  CALCIUM 7.5* 7.4* 7.4* 7.3* 7.8*  MG  --  1.6* 1.8 1.7 1.7     GFR: Estimated Creatinine Clearance: 80.8 mL/min (by C-G formula based on SCr of 0.83 mg/dL).  Liver Function Tests: Recent Labs  Lab 08/01/21 1503 08/03/21 0507  AST 63* 44*  ALT 25 19  ALKPHOS 107 115  BILITOT 8.7* 3.8*  PROT 5.8* 4.8*  ALBUMIN 2.5* 1.8*    No results for input(s): LIPASE, AMYLASE in the last 168 hours. No results for input(s): AMMONIA in  the last 168 hours.  Coagulation Profile: Recent Labs  Lab 08/01/21 1503  INR 2.1*     Cardiac Enzymes: Recent Labs  Lab 08/01/21 1603  CKTOTAL 386     BNP (last 3 results) No results for input(s): PROBNP in the last 8760 hours.  Lipid Profile: No results for input(s): CHOL, HDL, LDLCALC, TRIG, CHOLHDL, LDLDIRECT in the last 72 hours.  Thyroid Function Tests: No results for input(s): TSH, T4TOTAL, FREET4, T3FREE, THYROIDAB in the last 72 hours.  Anemia Panel: No results for input(s): VITAMINB12, FOLATE, FERRITIN, TIBC, IRON, RETICCTPCT in  the last 72 hours.  Urine analysis: No results found for: COLORURINE, APPEARANCEUR, LABSPEC, PHURINE, GLUCOSEU, HGBUR, BILIRUBINUR, KETONESUR, PROTEINUR, UROBILINOGEN, NITRITE, LEUKOCYTESUR  Sepsis Labs: Lactic Acid, Venous    Component Value Date/Time   LATICACIDVEN 1.8 08/02/2021 0506    MICROBIOLOGY: Recent Results (from the past 240 hour(s))  Resp Panel by RT-PCR (Flu A&B, Covid) Nasopharyngeal Swab     Status: Abnormal   Collection Time: 08/01/21  3:03 PM   Specimen: Nasopharyngeal Swab; Nasopharyngeal(NP) swabs in vial transport medium  Result Value Ref Range Status   SARS Coronavirus 2 by RT PCR POSITIVE (A) NEGATIVE Final    Comment: RESULT CALLED TO, READ BACK BY AND VERIFIED WITH: KARA BRIDGES 08/01/21 1624 KLW (NOTE) SARS-CoV-2 target nucleic acids are DETECTED.  The SARS-CoV-2 RNA is generally detectable in upper respiratory specimens during the acute phase of infection. Positive results are indicative of the presence of the identified virus, but do not rule out bacterial infection or co-infection with other pathogens not detected by the test. Clinical correlation with patient history and other diagnostic information is necessary to determine patient infection status. The expected result is Negative.  Fact Sheet for Patients: BloggerCourse.com  Fact Sheet for Healthcare  Providers: SeriousBroker.it  This test is not yet approved or cleared by the [place] FDA and  has been authorized for detection and/or diagnosis of SARS-CoV-2 by FDA under an Emergency Use Authorization (EUA).  This EUA will remain in effect (meaning this test can be used ) for the duration of  the COVID-19 declaration under Section 564(b)(1) of the Act, 21 U.S.C. section 360bbb-3(b)(1), unless the authorization is terminated or revoked sooner.     Influenza A by PCR NEGATIVE NEGATIVE Final   Influenza B by PCR NEGATIVE NEGATIVE Final    Comment: (NOTE) The Xpert Xpress SARS-CoV-2/FLU/RSV plus assay is intended as an aid in the diagnosis of influenza from Nasopharyngeal swab specimens and should not be used as a sole basis for treatment. Nasal washings and aspirates are unacceptable for Xpert Xpress SARS-CoV-2/FLU/RSV testing.  Fact Sheet for Patients: BloggerCourse.com  Fact Sheet for Healthcare Providers: SeriousBroker.it  This test is not yet approved or cleared by the [place] FDA and has been authorized for detection and/or diagnosis of SARS-CoV-2 by FDA under an Emergency Use Authorization (EUA). This EUA will remain in effect (meaning this test can be used) for the duration of the COVID-19 declaration under Section 564(b)(1) of the Act, 21 U.S.C. section 360bbb-3(b)(1), unless the authorization is terminated or revoked.  Performed at [facility], [address], [place], [place] [address]   Blood Culture (routine x 2)     Status: Abnormal   Collection Time: 08/01/21  3:03 PM   Specimen: BLOOD  Result Value Ref Range Status   Specimen Description   Final    BLOOD BLOOD RIGHT FOREARM Performed at [facility], [address], [place], [place] [address]    Special Requests   Final    BOTTLES DRAWN AEROBIC AND ANAEROBIC Blood Culture adequate volume Performed at  [facility], [address], [place], [place] [address]    Culture  Setup Time   Final    GRAM POSITIVE COCCI ANAEROBIC BOTTLE ONLY CRITICAL RESULT CALLED TO, READ BACK BY AND VERIFIED WITH: DEVIN MITCHELL @0835  08/02/21 SCS    Culture (A)  Final    STAPHYLOCOCCUS HOMINIS THE SIGNIFICANCE OF ISOLATING THIS ORGANISM FROM A SINGLE SET OF BLOOD CULTURES WHEN MULTIPLE SETS ARE DRAWN IS UNCERTAIN.  PLEASE NOTIFY THE MICROBIOLOGY DEPARTMENT WITHIN ONE WEEK IF SPECIATION AND SENSITIVITIES ARE REQUIRED. Performed at [facility], [address], [place], [place] [address]    Report Status 08/04/2021 FINAL  Final  Blood Culture (routine x 2)     Status: None   Collection Time: 08/01/21  3:03 PM   Specimen: BLOOD  Result Value Ref Range Status   Specimen Description BLOOD RIGHT ANTECUBITAL  Final   Special Requests   Final    BOTTLES DRAWN AEROBIC AND ANAEROBIC Blood Culture adequate volume   Culture   Final    NO GROWTH 5 DAYS Performed at [facility], [address] [place], [place] [address]    Report Status 08/06/2021 FINAL  Final  Blood Culture ID Panel (Reflexed)     Status: Abnormal   Collection Time: 08/01/21  3:03 PM  Result Value Ref Range Status   Enterococcus faecalis NOT DETECTED NOT DETECTED Final   Enterococcus Faecium NOT DETECTED NOT DETECTED Final   Listeria monocytogenes NOT DETECTED NOT DETECTED Final   Staphylococcus species DETECTED (A) NOT DETECTED Final    Comment: CRITICAL RESULT CALLED TO, READ BACK BY AND VERIFIED WITH: DEVIN MITCHELL @0835  08/02/21 SCS    Staphylococcus aureus (BCID) NOT DETECTED NOT DETECTED Final   Staphylococcus epidermidis NOT DETECTED NOT DETECTED Final   Staphylococcus lugdunensis NOT DETECTED NOT DETECTED Final   Streptococcus species NOT DETECTED NOT DETECTED Final   Streptococcus agalactiae NOT DETECTED NOT DETECTED Final   Streptococcus pneumoniae NOT DETECTED NOT DETECTED Final   Streptococcus pyogenes NOT  DETECTED NOT DETECTED Final   A.calcoaceticus-baumannii NOT DETECTED NOT DETECTED Final   Bacteroides fragilis NOT DETECTED NOT DETECTED Final   Enterobacterales NOT DETECTED NOT DETECTED Final   Enterobacter cloacae complex NOT DETECTED NOT DETECTED Final   Escherichia coli NOT DETECTED NOT DETECTED Final   Klebsiella aerogenes NOT DETECTED NOT DETECTED Final   Klebsiella oxytoca NOT DETECTED NOT DETECTED Final   Klebsiella pneumoniae NOT DETECTED NOT DETECTED Final   Proteus species NOT DETECTED NOT DETECTED Final   Salmonella species NOT DETECTED NOT DETECTED Final   Serratia marcescens NOT DETECTED NOT DETECTED Final   Haemophilus influenzae NOT DETECTED NOT DETECTED Final   Neisseria meningitidis NOT DETECTED NOT DETECTED Final   Pseudomonas aeruginosa NOT DETECTED NOT DETECTED Final   Stenotrophomonas maltophilia NOT DETECTED NOT DETECTED Final   Candida albicans NOT DETECTED NOT DETECTED Final   Candida auris NOT DETECTED NOT DETECTED Final   Candida glabrata NOT DETECTED NOT DETECTED Final   Candida krusei NOT DETECTED NOT DETECTED Final   Candida parapsilosis NOT DETECTED NOT DETECTED Final   Candida tropicalis NOT DETECTED NOT DETECTED Final   Cryptococcus neoformans/gattii NOT DETECTED NOT DETECTED Final    Comment: Performed at [facility], [address] [place], [place]    RADIOLOGY STUDIES/RESULTS: No results found.   LOS: 6 days   [id number], MD  [facility]    To contact the attending provider between 7A-7P or the covering provider during after hours 7P-7A, please log into the web site [email or web address] and access using universal [place] password for that web site. If you do not have the password, please call the hospital operator.  08/07/2021, 11:21 AM

## 2021-08-07 NOTE — Progress Notes (Signed)
Occupational Therapy Treatment Patient Details Name: Johnny Dunlap MRN: [id number] DOB: 21-Aug-1950 Today's Date: 08/07/2021    History of present illness 71 y.o. M here with GI bleed a few weeks ago, now with progessive pain and swelling affecting his left arm, admitted with sepsis/cellulitis.  He states that it is very painful for him to move the arm and that it has looked very red recently.  He has not sure if he has had a fever and he denies any recent trauma to the arm.  Pt brought in by EMS who apparently found him outside of homeless shelter covered in stool and dirt. PMH of COPD, cirrhosis.   OT comments  Pt seen for OT tx this date. Pt agreeable to session. Performs bed mobility with supervision + increased time/effort. Pt tolerates standing without UE support while standing inside RW frame with elevated tray table in front of him to apply deodorant to both underarms. Pt experienced slight LOB and had difficulty to self correct, ultimately requiring MIN A from OT to maximize safety. MIN A for lateral steps + RW EOB to improve positioning before return to supine. Pt continues to benefit from skilled OT services in order to maximize independence and safety.    Follow Up Recommendations  SNF;Supervision/Assistance - 24 hour    Equipment Recommendations  3 in 1 bedside commode    Recommendations for Other Services      Precautions / Restrictions Precautions Precautions: Fall Restrictions Weight Bearing Restrictions: No       Mobility Bed Mobility Overal bed mobility: Needs Assistance Bed Mobility: Supine to Sit;Sit to Supine     Supine to sit: Supervision;HOB elevated Sit to supine: Supervision   General bed mobility comments: increased time/effort, supervision for safety    Transfers Overall transfer level: Needs assistance Equipment used: Rolling walker (2 wheeled) Transfers: Sit to/from Stand Sit to Stand: Min assist         General transfer comment: VC for hand  placement, Min A for STS and for lateral side steps in standing EOB with RW 2/2 decreased strength and balance    Balance Overall balance assessment: Needs assistance Sitting-balance support: No upper extremity supported;Feet supported Sitting balance-Leahy Scale: Fair     Standing balance support: No upper extremity supported;During functional activity Standing balance-Leahy Scale: Poor Standing balance comment: briefly tolerates no UE support to apply deodorant in standing, but then demo's slight LOB with difficulty self correcting requiring MIN A from therapist                           ADL either performed or assessed with clinical judgement   ADL Overall ADL's : Needs assistance/impaired     Grooming: Applying deodorant;Standing;Min guard;Set up Grooming Details (indicate cue type and reason): Pt required CGA-MIN A to correct for static balance while applying deodorant in standing within RW in front of elevated tray table, unsteady                               General ADL Comments: fatigues quickly     Vision Baseline Vision/History: Cataracts Patient Visual Report: No change from baseline     Perception     Praxis      Cognition Arousal/Alertness: Awake/alert Behavior During Therapy: WFL for tasks assessed/performed Overall Cognitive Status: No family/caregiver present to determine baseline cognitive functioning  General Comments: Cues for safety, increased processing time        Exercises     Shoulder Instructions       General Comments      Pertinent Vitals/ Pain       Pain Assessment: No/denies pain  Home Living                                          Prior Functioning/Environment              Frequency  Min 2X/week        Progress Toward Goals  OT Goals(current goals can now be found in the care plan section)  Progress towards OT goals:  Progressing toward goals  Acute Rehab OT Goals Patient Stated Goal: to get a motorized scooter OT Goal Formulation: With patient Time For Goal Achievement: 08/16/21 Potential to Achieve Goals: Good  Plan Discharge plan remains appropriate;Frequency remains appropriate    Co-evaluation                 AM-PAC OT "6 Clicks" Daily Activity     Outcome Measure   Help from another person eating meals?: A Little Help from another person taking care of personal grooming?: A Little Help from another person toileting, which includes using toliet, bedpan, or urinal?: A Little Help from another person bathing (including washing, rinsing, drying)?: A Little Help from another person to put on and taking off regular upper body clothing?: A Little Help from another person to put on and taking off regular lower body clothing?: A Little 6 Click Score: 18    End of Session    OT Visit Diagnosis: Other abnormalities of gait and mobility (R26.89)   Activity Tolerance Patient tolerated treatment well   Patient Left in bed;with call bell/phone within reach;with bed alarm set   Nurse Communication Other (comment) (something for his cough)        Time: 1340-1355 OT Time Calculation (min): 15 min  Charges: OT General Charges $OT Visit: 1 Visit OT Treatments $Self Care/Home Management : 8-22 mins  Wynona Canes, MPH, MS, OTR/L ascom [phone] 08/07/21, 2:06 PM

## 2021-08-07 NOTE — Progress Notes (Signed)
1200 dose Midodrine held per Jerral Ralph, MD d/t BP       08/07/21 1126  Vitals  Temp 97.8 F (36.6 C)  BP (!) 130/54  MAP (mmHg) 73  BP Location Right Leg  BP Method Automatic  Patient Position (if appropriate) Lying  Pulse Rate 61  Resp 18

## 2021-08-07 NOTE — Progress Notes (Signed)
Physical Therapy Treatment Patient Details Name: Johnny Dunlap MRN: [id number] DOB: 06/14/1950 Today's Date: 08/07/2021    History of Present Illness 71 y.o. M here with GI bleed a few weeks ago, now with progessive pain and swelling affecting his left arm, admitted with sepsis/cellulitis.  He states that it is very painful for him to move the arm and that it has looked very red recently.  He has not sure if he has had a fever and he denies any recent trauma to the arm.  Pt brought in by EMS who apparently found him outside of homeless shelter covered in stool and dirt. PMH of COPD, cirrhosis.    PT Comments    Pt stated he participated in PT earlier today.  Upon further questioning he was up with OT.  Encouraged and he agrees  to PT.  BLE supine ex with cues to continue.  Patient asking about SNF transitions.  He beings coughing and stated he is sore from his dry cough.  Asks for an emesis basin and sits forward in bed coughing stating he feels sick.  RN notified of pt's request for cough/pain meds.  Pt is unable at this time to walk or further address mobility skills.  See OT note for mobility update.     Follow Up Recommendations  SNF;Supervision/Assistance - 24 hour     Equipment Recommendations       Recommendations for Other Services       Precautions / Restrictions Precautions Precautions: Fall Restrictions Weight Bearing Restrictions: No    Mobility  Bed Mobility Overal bed mobility: Needs Assistance Bed Mobility: Supine to Sit;Sit to Supine     Supine to sit: Supervision;HOB elevated Sit to supine: Supervision   General bed mobility comments: increased time/effort, supervision for safety    Transfers Overall transfer level: Needs assistance Equipment used: Rolling walker (2 wheeled) Transfers: Sit to/from Stand Sit to Stand: Min assist         General transfer comment: VC for hand placement, Min A for STS and for lateral side steps in standing EOB with RW 2/2  decreased strength and balance  Ambulation/Gait                 Stairs             Wheelchair Mobility    Modified Rankin (Stroke Patients Only)       Balance Overall balance assessment: Needs assistance Sitting-balance support: No upper extremity supported;Feet supported Sitting balance-Leahy Scale: Fair     Standing balance support: No upper extremity supported;During functional activity Standing balance-Leahy Scale: Poor Standing balance comment: briefly tolerates no UE support to apply deodorant in standing, but then demo's slight LOB with difficulty self correcting requiring MIN A from therapist                            Cognition Arousal/Alertness: Awake/alert Behavior During Therapy: WFL for tasks assessed/performed Overall Cognitive Status: No family/caregiver present to determine baseline cognitive functioning                                 General Comments: Cues for safety, increased processing time      Exercises Other Exercises Other Exercises: BLE A/AAROM x 10 - tactile cues for encouragement    General Comments        Pertinent Vitals/Pain Pain Assessment: 0-10 Faces Pain Scale: Hurts  even more Pain Location: chest from dry cough Pain Descriptors / Indicators: Sharp;Sore;Tender;Discomfort;Grimacing Pain Intervention(s): Limited activity within patient's tolerance;RN gave pain meds during session    Home Living                      Prior Function            PT Goals (current goals can now be found in the care plan section) Acute Rehab PT Goals Patient Stated Goal: to get a motorized scooter Progress towards PT goals: Progressing toward goals    Frequency    Min 2X/week      PT Plan Current plan remains appropriate    Co-evaluation              AM-PAC PT "6 Clicks" Mobility   Outcome Measure  Help needed turning from your back to your side while in a flat bed without using  bedrails?: A Little Help needed moving from lying on your back to sitting on the side of a flat bed without using bedrails?: A Little Help needed moving to and from a bed to a chair (including a wheelchair)?: A Little Help needed standing up from a chair using your arms (e.g., wheelchair or bedside chair)?: A Little Help needed to walk in hospital room?: A Lot Help needed climbing 3-5 steps with a railing? : A Lot 6 Click Score: 16    End of Session   Activity Tolerance: Other (comment) Patient left: in bed;with bed alarm set;with call bell/phone within reach Nurse Communication: Mobility status;Patient requests pain meds PT Visit Diagnosis: Other abnormalities of gait and mobility (R26.89);Muscle weakness (generalized) (M62.81)     Time: 8675-4492 PT Time Calculation (min) (ACUTE ONLY): 18 min  Charges:  $Therapeutic Exercise: 8-22 mins                    Danielle Dess, PTA 08/07/21, 3:02 PM , 2:58 PM

## 2021-08-07 NOTE — TOC Progression Note (Signed)
Transition of Care [facility]) - Progression Note    Patient Details  Name: Lucas Winograd MRN: [id number] Date of Birth: 31-Jan-1950  Transition of Care [facility]) CM/SW Contact  Allayne Butcher, RN Phone Number: 08/07/2021, 11:23 AM  Clinical Narrative:    [facility] has offered a bed and patient accepts.  Delorise Shiner at [facility] is starting [employer] authorization.     Expected Discharge Plan: Skilled Nursing Facility Barriers to Discharge: Continued Medical Work up  Expected Discharge Plan and Services Expected Discharge Plan: Skilled Nursing Facility   Discharge Planning Services: CM Consult Post Acute Care Choice: Skilled Nursing Facility Living arrangements for the past 2 months: Homeless                 DME Arranged: N/A         HH Arranged: NA           Social Determinants of Health (SDOH) Interventions    Readmission Risk Interventions No flowsheet data found.

## 2021-08-07 NOTE — Plan of Care (Signed)
Pt orientedx3, disoriented to time but easily reoriented. VSS. Adequate UO in [place]. Turns self in bed, fall/safety precautions in place, rounding performed, needs/concerns addressed.   Problem: Education: Goal: Knowledge of General Education information will improve Description: Including pain rating scale, medication(s)/side effects and non-pharmacologic comfort measures Outcome: Progressing   Problem: Health Behavior/Discharge Planning: Goal: Ability to manage health-related needs will improve Outcome: Progressing   Problem: Clinical Measurements: Goal: Ability to maintain clinical measurements within normal limits will improve Outcome: Progressing Goal: Will remain free from infection Outcome: Progressing Goal: Diagnostic test results will improve Outcome: Progressing Goal: Respiratory complications will improve Outcome: Progressing Goal: Cardiovascular complication will be avoided Outcome: Progressing   Problem: Activity: Goal: Risk for activity intolerance will decrease Outcome: Progressing   Problem: Nutrition: Goal: Adequate nutrition will be maintained Outcome: Progressing   Problem: Coping: Goal: Level of anxiety will decrease Outcome: Progressing   Problem: Elimination: Goal: Will not experience complications related to bowel motility Outcome: Progressing Goal: Will not experience complications related to urinary retention Outcome: Progressing   Problem: Pain Managment: Goal: General experience of comfort will improve Outcome: Progressing   Problem: Safety: Goal: Ability to remain free from injury will improve Outcome: Progressing   Problem: Skin Integrity: Goal: Risk for impaired skin integrity will decrease Outcome: Progressing

## 2021-08-08 DIAGNOSIS — L03114 Cellulitis of left upper limb: Secondary | ICD-10-CM | POA: Diagnosis not present

## 2021-08-08 DIAGNOSIS — A419 Sepsis, unspecified organism: Secondary | ICD-10-CM | POA: Diagnosis not present

## 2021-08-08 DIAGNOSIS — R6520 Severe sepsis without septic shock: Secondary | ICD-10-CM | POA: Diagnosis not present

## 2021-08-08 DIAGNOSIS — K7469 Other cirrhosis of liver: Secondary | ICD-10-CM | POA: Diagnosis not present

## 2021-08-08 LAB — BASIC METABOLIC PANEL
Anion gap: 4 — ABNORMAL LOW (ref 5–15)
BUN: 19 mg/dL (ref 8–23)
CO2: 26 mmol/L (ref 22–32)
Calcium: 7.8 mg/dL — ABNORMAL LOW (ref 8.9–10.3)
Chloride: 107 mmol/L (ref 98–111)
Creatinine, Ser: 0.92 mg/dL (ref 0.61–1.24)
GFR, Estimated: >60 mL/min (ref >60)
Glucose, Bld: 95 mg/dL (ref 70–99)
Potassium: 3.8 mmol/L (ref 3.5–5.1)
Sodium: 137 mmol/L (ref 135–145)

## 2021-08-08 MED ORDER — FUROSEMIDE 40 MG PO TABS: 40.0000 mg | ORAL_TABLET | Freq: Every day | ORAL | Status: DC

## 2021-08-08 MED ORDER — ADULT MULTIVITAMIN W/MINERALS CH: 1.0000 | ORAL_TABLET | Freq: Every day | ORAL | Status: AC

## 2021-08-08 MED ORDER — MIDODRINE HCL 10 MG PO TABS: 10.0000 mg | ORAL_TABLET | Freq: Three times a day (TID) | ORAL | Status: DC

## 2021-08-08 MED ORDER — SPIRONOLACTONE 100 MG PO TABS: 100.0000 mg | ORAL_TABLET | Freq: Every day | ORAL | Status: DC

## 2021-08-08 MED ORDER — MIDODRINE HCL 5 MG PO TABS
10.0000 mg | ORAL_TABLET | Freq: Three times a day (TID) | ORAL | Status: DC
  Administered 2021-08-08 – 2021-08-15 (×20): 10 mg via ORAL
  Filled 2021-08-08 (×21): qty 2

## 2021-08-08 MED ORDER — SPIRONOLACTONE 25 MG PO TABS
100.0000 mg | ORAL_TABLET | Freq: Every day | ORAL | Status: DC
  Administered 2021-08-08: 100 mg via ORAL
  Filled 2021-08-08 (×4): qty 4

## 2021-08-08 MED ORDER — MIDODRINE HCL 5 MG PO TABS: 5.0000 mg | ORAL_TABLET | Freq: Three times a day (TID) | ORAL | Status: DC

## 2021-08-08 MED ORDER — ENSURE ENLIVE PO LIQD: 237.0000 mL | ORAL | 12 refills | Status: DC

## 2021-08-08 NOTE — Plan of Care (Signed)
Patient orientedx4, VSS, remains on RA. C/o pain addressed with PRN tylenol and oxycodone. Ambulated to bathroom with use of walker. Adequate UO, x1 BM this shift. Turns self in bed, fall/safety precautions in place, rounding performed, needs/concerns addressed during shift.   Problem: Education: Goal: Knowledge of General Education information will improve Description: Including pain rating scale, medication(s)/side effects and non-pharmacologic comfort measures Outcome: Progressing   Problem: Health Behavior/Discharge Planning: Goal: Ability to manage health-related needs will improve Outcome: Progressing   Problem: Clinical Measurements: Goal: Ability to maintain clinical measurements within normal limits will improve Outcome: Progressing Goal: Will remain free from infection Outcome: Progressing Goal: Diagnostic test results will improve Outcome: Progressing Goal: Respiratory complications will improve Outcome: Progressing Goal: Cardiovascular complication will be avoided Outcome: Progressing   Problem: Activity: Goal: Risk for activity intolerance will decrease Outcome: Progressing   Problem: Nutrition: Goal: Adequate nutrition will be maintained Outcome: Progressing   Problem: Coping: Goal: Level of anxiety will decrease Outcome: Progressing   Problem: Elimination: Goal: Will not experience complications related to bowel motility Outcome: Progressing Goal: Will not experience complications related to urinary retention Outcome: Progressing   Problem: Pain Managment: Goal: General experience of comfort will improve Outcome: Progressing   Problem: Safety: Goal: Ability to remain free from injury will improve Outcome: Progressing   Problem: Skin Integrity: Goal: Risk for impaired skin integrity will decrease Outcome: Progressing

## 2021-08-08 NOTE — Discharge Summary (Addendum)
PATIENT DETAILS Name: Johnny Dunlap Age: 71 y.o. Sex: male Date of Birth: 03-31-1950 MRN: [id number]. Admitting Physician: Alford Highland, MD PCP:Pcp, No  Admit Date: 08/01/2021 Discharge date: 08/15/2021  Recommendations for Outpatient Follow-up:  Follow up with PCP in 1-2 weeks Please obtain CMP/CBC in one week   Admitted From:  Home  Disposition: SNF   Home Health: No  Equipment/Devices: None  Discharge Condition: Stable  CODE STATUS: DNR  Diet recommendation:  Diet Order             Diet - low sodium heart healthy           Diet regular Room service appropriate? Yes; Fluid consistency: Thin  Diet effective now                    Brief Narrative: Patient is a 71 y.o. male with history of liver cirrhosis, COPD, recent COVID 19 infection-presenting with left forearm cellulitis.   Significant events: 8/2>> admit for left forearm soft tissue infection   Significant studies: 8/2>> x-ray chest: No pneumonia-small right pleural effusion 8/2>> x-ray left elbow/left wrist: Diffuse soft tissue swelling 8/3>> x-ray left forearm: Diffuse soft tissue swelling   Antimicrobial therapy: Ancef: 8/3>>8/7 Keflex: 8/7>>8/9   Microbiology data: 8/2>> blood culture 1/2: Staph hominis   Procedures : None   Consults: Orthopedics  Brief Hospital Course: Sepsis due to left forearm cellulitis: Sepsis physiology has improved-minimal erythema/swelling in the left proximal forearm (medial aspect).initially on Ancef-subsequently has been transitioned to Keflex-has completed 7 days of antimicrobial therapy-does not need any further antimicrobial therapy on discharge.   1/2 blood cultures positive for staph hominis which is probably a contamination and of no clinical significance.     Liver cirrhosis with coagulopathy/thrombocytopenia/esophageal varices/ascites: Volume status relatively stable-similar ascites and mild lower extremity edema-SBP with soft-he was started  on midodrine.  And refused taking daily Lasix it has been changed to as needed.  Kindly monitor fluid status and diuretic dose at SNF.   Recent COVID-19 infection: Currently asymptomatic- off isolation as of 08/03/21.   Hypokalemia/hypomagnesemia: Repleted-follow periodically.   Homelessness  Nutrition Status: Nutrition Problem: Severe Malnutrition Etiology: social / environmental circumstances Signs/Symptoms: severe fat depletion, severe muscle depletion Interventions: Ensure Enlive (each supplement provides 350kcal and 20 grams of protein), MVI, Magic cup     Procedures None  Discharge Diagnoses:  Active Problems:   Sepsis (HCC)   Protein-calorie malnutrition, severe   Discharge Instructions:  Activity:  As tolerated  Discharge Instructions     Diet - low sodium heart healthy   Complete by: As directed    Discharge instructions   Complete by: As directed    Follow with Primary MD  in 7 days   Get CBC, CMP, 2 view Chest X ray -  checked next visit within 1 week by Primary MD or SNF MD   Activity: As tolerated with Full fall precautions use walker/cane & assistance as needed  Disposition SNF  Diet: Heart Healthy  with feeding assistance and aspiration precautions.  Special Instructions: If you have smoked or chewed Tobacco  in the last 2 yrs please stop smoking, stop any regular Alcohol  and or any Recreational drug use.  On your next visit with your primary care physician please Get Medicines reviewed and adjusted.  Please request your Prim.MD to go over all Hospital Tests and Procedure/Radiological results at the follow up, please get all Hospital records sent to your Prim MD by signing hospital release before you  go home.  If you experience worsening of your admission symptoms, develop shortness of breath, life threatening emergency, suicidal or homicidal thoughts you must seek medical attention immediately by calling 911 or calling your MD immediately  if symptoms  less severe.  You Must read complete instructions/literature along with all the possible adverse reactions/side effects for all the Medicines you take and that have been prescribed to you. Take any new Medicines after you have completely understood and accpet all the possible adverse reactions/side effects.   Increase activity slowly   Complete by: As directed    Increase activity slowly   Complete by: As directed    No dressing needed   Complete by: As directed    No wound care   Complete by: As directed       Allergies as of 08/15/2021   No Known Allergies      Medication List     TAKE these medications    feeding supplement Liqd Take 237 mLs by mouth 2 (two) times daily between meals.   furosemide 40 MG tablet Commonly known as: LASIX Take 1 tablet (40 mg total) by mouth daily as needed for fluid or edema.   midodrine 10 MG tablet Commonly known as: PROAMATINE Take 0.5 tablets (5 mg total) by mouth 3 (three) times daily with meals. Hold if SBP is more than 130   multivitamin with minerals Tabs tablet Take 1 tablet by mouth daily.               Discharge Care Instructions  (From admission, onward)           Start     Ordered   08/08/21 0000  No dressing needed        08/08/21 1218            Contact information for follow-up providers     Primary care practitioner. Schedule an appointment as soon as possible for a visit in 1 week(s).               Contact information for after-discharge care     Destination     [facility] .   Service: Skilled Nursing Contact information: [address] [place] [id number] [phone]                    No Known Allergies    Consultations: Ortho   Other Procedures/Studies: DG Chest 2 View  Result Date: 07/23/2021 CLINICAL DATA:  shortness of breath EXAM: CHEST - 2 VIEW COMPARISON:  None. FINDINGS: The cardiomediastinal silhouette is  mildly enlarged in contour. Small RIGHT pleural effusion. No pneumothorax. Flattening of the diaphragms and diffuse reticular opacities are consistent with history of underlying emphysema. Rounded opacity along the RIGHT paramediastinal border inferiorly. Visualized abdomen is unremarkable. No acute osseous abnormality. IMPRESSION: 1. Rounded opacity along the RIGHT inferior paramediastinal border is nonspecific. Given underlying emphysema, recommend further evaluation with dedicated chest CT. Electronically Signed   By: Meda KlinefelterStephanie  Peacock MD   On: 07/23/2021 14:06   DG Elbow Complete Left  Result Date: 08/01/2021 CLINICAL DATA:  Left arm infection, pain EXAM: LEFT ELBOW - COMPLETE 3+ VIEW; LEFT WRIST - COMPLETE 3+ VIEW COMPARISON:  None. FINDINGS: Left elbow: Frontal, bilateral oblique, lateral views are obtained. No acute fracture, subluxation, or dislocation. Joint spaces are well preserved. No joint effusion. Severe soft tissue swelling throughout the visualized left elbow. No radiopaque foreign body. Left wrist: Frontal, oblique, lateral views  demonstrate no fracture, subluxation, or dislocation. Joint spaces are well preserved. Diffuse soft tissue edema, greatest throughout the dorsum of the wrist and visualized hand. No radiopaque foreign body. IMPRESSION: 1. Diffuse soft tissue swelling of the visualized portions of the left wrist and elbow. No radiopaque foreign body or subcutaneous gas. 2. No acute bony abnormalities. Electronically Signed   By: Sharlet Salina M.D.   On: 08/01/2021 16:05   DG Forearm Left  Result Date: 08/02/2021 CLINICAL DATA:  Edema and cellulitis. EXAM: LEFT FOREARM - 2 VIEW COMPARISON:  Prior studies 08/01/2021. FINDINGS: No acute bony or joint abnormality identified. No evidence of fracture or dislocation. Diffuse soft tissue swelling. Peripheral vascular calcification. IMPRESSION: 1. Diffuse soft tissue swelling. Peripheral vascular calcification. 2.  No acute bony abnormality.  Electronically Signed   By: Maisie Fus  Register   On: 08/02/2021 12:29   DG Wrist Complete Left  Result Date: 08/01/2021 CLINICAL DATA:  Left arm infection, pain EXAM: LEFT ELBOW - COMPLETE 3+ VIEW; LEFT WRIST - COMPLETE 3+ VIEW COMPARISON:  None. FINDINGS: Left elbow: Frontal, bilateral oblique, lateral views are obtained. No acute fracture, subluxation, or dislocation. Joint spaces are well preserved. No joint effusion. Severe soft tissue swelling throughout the visualized left elbow. No radiopaque foreign body. Left wrist: Frontal, oblique, lateral views demonstrate no fracture, subluxation, or dislocation. Joint spaces are well preserved. Diffuse soft tissue edema, greatest throughout the dorsum of the wrist and visualized hand. No radiopaque foreign body. IMPRESSION: 1. Diffuse soft tissue swelling of the visualized portions of the left wrist and elbow. No radiopaque foreign body or subcutaneous gas. 2. No acute bony abnormalities. Electronically Signed   By: Sharlet Salina M.D.   On: 08/01/2021 16:05   CT Chest W Contrast  Result Date: 07/23/2021 CLINICAL DATA:  Abnormal xray - lung opacity/opacities EXAM: CT CHEST WITH CONTRAST TECHNIQUE: Multidetector CT imaging of the chest was performed during intravenous contrast administration. CONTRAST:  75mL OMNIPAQUE IOHEXOL 300 MG/ML  SOLN COMPARISON:  Same day radiograph FINDINGS: Cardiovascular: Heart is mildly enlarged. Three-vessel coronary artery atherosclerotic calcifications. No significant pericardial effusion. Dense aortic valve calcifications. LEFT vertebral artery arises from the aortic arch. Atherosclerotic calcifications of the aorta. Mediastinum/Nodes: Thyroid is unremarkable. No axillary adenopathy. Mildly prominent mediastinal lymph nodes with representative pretracheal lymph node measuring 9 mm in the short axis (series 2, image 68). Lungs/Pleura: Moderate centrilobular and paraseptal emphysema. Biapical irregular opacities most consistent with  scar. There is a moderate RIGHT pleural effusion. There is a small LEFT pleural effusion. Bibasilar enhancing platelike opacities. There is a suggestion of a more focal irregular nodule which measures 7 x 7 mm (series 2, image 71). Upper Abdomen: Nodular contour of the liver. Recanalized paraumbilical vein. Splenomegaly. Esophageal varices. Moderate hiatal hernia. Small volume ascites. Nonobstructive RIGHT-sided nephrolithiasis. Musculoskeletal: No fracture is seen. IMPRESSION: 1. Moderate RIGHT and small LEFT pleural effusion with likely bibasilar atelectasis. Given underlying emphysema, recommend follow-up CT in 3-6 months to assess for resolution of more focal possible nodular opacities. 2. Given emphysema, recommend evaluation for candidacy for annual lung cancer screening. 3. Cirrhosis with sequela of portal venous hypertension including recanalization of the umbilical vein, splenomegaly, ascites and esophageal varices. 4. Questioned nodular opacity corresponds to summation of a distended inferior cavoatrial junction, hiatal hernia, esophageal varices and pleural effusion. Aortic Atherosclerosis (ICD10-I70.0) and Emphysema (ICD10-J43.9). Electronically Signed   By: Meda Klinefelter MD   On: 07/23/2021 16:38   US Venous Img Upper Uni Left  Result Date: 08/01/2021 CLINICAL DATA:  Left upper extremity pain and swelling for 2 weeks EXAM: LEFT UPPER EXTREMITY VENOUS DOPPLER ULTRASOUND TECHNIQUE: Gray-scale sonography with graded compression, as well as color Doppler and duplex ultrasound were performed to evaluate the upper extremity deep venous system from the level of the subclavian vein and including the jugular, axillary, basilic, radial, ulnar and upper cephalic vein. Spectral Doppler was utilized to evaluate flow at rest and with distal augmentation maneuvers. COMPARISON:  None. FINDINGS: Contralateral Subclavian Vein: Respiratory phasicity is normal and symmetric with the symptomatic side. No evidence of  thrombus. Normal compressibility. Internal Jugular Vein: No evidence of thrombus. Normal compressibility, respiratory phasicity and response to augmentation. Subclavian Vein: No evidence of thrombus. Normal compressibility, respiratory phasicity and response to augmentation. Axillary Vein: No evidence of thrombus. Normal compressibility, respiratory phasicity and response to augmentation. Cephalic Vein: No evidence of thrombus. Normal compressibility, respiratory phasicity and response to augmentation. Basilic Vein: No evidence of thrombus. Normal compressibility, respiratory phasicity and response to augmentation. Brachial Veins: No evidence of thrombus. Normal compressibility, respiratory phasicity and response to augmentation. Radial Veins: No evidence of thrombus. Normal compressibility, respiratory phasicity and response to augmentation. Ulnar Veins: No evidence of thrombus. Normal compressibility, respiratory phasicity and response to augmentation. Venous Reflux:  None visualized. Other Findings:  Soft tissue edema. IMPRESSION: 1. Negative examination for deep venous thrombosis within the left upper extremity. 2.  Diffuse soft tissue edema. Electronically Signed   By: Lauralyn Primes M.D.   On: 08/01/2021 16:41   DG Chest Port 1 View  Result Date: 08/01/2021 CLINICAL DATA:  Sepsis. EXAM: PORTABLE CHEST 1 VIEW COMPARISON:  CT chest dated July 23, 2021 FINDINGS: Cardiac and mediastinal contours within normal limits. Small right pleural effusion with associated atelectasis. Background emphysematous changes. No evidence of pneumothorax. IMPRESSION: Small right pleural effusion with associated atelectasis, findings appear unchanged compared to prior CT. Electronically Signed   By: Allegra Lai MD   On: 08/01/2021 15:29   ECHOCARDIOGRAM COMPLETE  Result Date: 07/24/2021    ECHOCARDIOGRAM REPORT   Patient Name:   RAY Malloy  Date of Exam: 07/24/2021 Medical Rec #:  [id number]  Height:       72.0 in Accession #:     [id number] Weight:       145.0 lb Date of Birth:  1950/08/25  BSA:          1.858 m Patient Age:    70 years   BP:           127/77 mmHg Patient Gender: M          HR:           59 bpm. Exam Location:  [facility] Procedure: 2D Echo, Color Doppler and Cardiac Doppler Indications:     R06.00 Dyspnea  History:         Patient has no prior history of Echocardiogram examinations.                  COPD. Cirrhosis of liver; Pt tested positive for COVID-19 on                  07/23/21.  Sonographer:     Humphrey Rolls RDCS (AE) Referring Phys:  [id number] Verdia Kuba Diagnosing Phys: Yvonne Kendall MD  Sonographer Comments: Suboptimal apical window. Image acquisition challenging due to uncooperative patient, Image acquisition challenging due to COPD and pt holding breath throughout echo. IMPRESSIONS  1. Left ventricular ejection fraction, by estimation, is >55%. The left ventricle has normal  function. Left ventricular endocardial border not optimally defined to evaluate regional wall motion. Left ventricular diastolic parameters are indeterminate.  2. Right ventricular systolic function is normal. The right ventricular size is normal.  3. The mitral valve is abnormal. Trivial mitral valve regurgitation. No evidence of mitral stenosis.  4. The aortic valve was not well visualized. Aortic valve regurgitation is not visualized. No aortic stenosis is present. FINDINGS  Left Ventricle: Left ventricular ejection fraction, by estimation, is >55%. The left ventricle has normal function. Left ventricular endocardial border not optimally defined to evaluate regional wall motion. The left ventricular internal cavity size was  normal in size. There is no left ventricular hypertrophy. Left ventricular diastolic parameters are indeterminate. Right Ventricle: The right ventricular size is normal. No increase in right ventricular wall thickness. Right ventricular systolic function is normal. Left Atrium: Left atrial size was not well visualized.  Right Atrium: Right atrial size was normal in size. Pericardium: Trivial pericardial effusion is present. Mitral Valve: The mitral valve is abnormal. There is mild thickening of the mitral valve leaflet(s). Trivial mitral valve regurgitation. No evidence of mitral valve stenosis. MV peak gradient, 3.3 mmHg. The mean mitral valve gradient is 1.0 mmHg. Tricuspid Valve: The tricuspid valve is grossly normal. Tricuspid valve regurgitation is trivial. Aortic Valve: The aortic valve was not well visualized. Aortic valve regurgitation is not visualized. No aortic stenosis is present. Aortic valve mean gradient measures 4.0 mmHg. Aortic valve peak gradient measures 7.2 mmHg. Aortic valve area, by VTI measures 1.96 cm. Pulmonic Valve: The pulmonic valve was not well visualized. Pulmonic valve regurgitation is not visualized. No evidence of pulmonic stenosis. Aorta: The aortic root is normal in size and structure. Pulmonary Artery: The pulmonary artery is not well seen. Venous: The inferior vena cava was not well visualized. IAS/Shunts: The interatrial septum was not well visualized.  LEFT VENTRICLE PLAX 2D LVIDd:         4.10 cm  Diastology LVIDs:         2.30 cm  LV e' medial:    8.81 cm/s LV PW:         0.90 cm  LV E/e' medial:  9.2 LV IVS:        0.90 cm  LV e' lateral:   6.53 cm/s LVOT diam:     2.10 cm  LV E/e' lateral: 12.5 LV SV:         68 LV SV Index:   36 LVOT Area:     3.46 cm  RIGHT VENTRICLE RV Basal diam:  3.50 cm LEFT ATRIUM         Index      RIGHT ATRIUM           Index LA diam:    3.20 cm 1.72 cm/m RA Area:     13.70 cm                                RA Volume:   35.90 ml  19.32 ml/m  AORTIC VALVE                   PULMONIC VALVE AV Area (Vmax):    2.61 cm    PV Vmax:       0.87 m/s AV Area (Vmean):   2.37 cm    PV Vmean:      58.000 cm/s AV Area (VTI):     1.96 cm  PV VTI:        0.147 m AV Vmax:           134.00 cm/s PV Peak grad:  3.0 mmHg AV Vmean:          92.500 cm/s PV Mean grad:  2.0 mmHg  AV VTI:            0.345 m AV Peak Grad:      7.2 mmHg AV Mean Grad:      4.0 mmHg LVOT Vmax:         101.00 cm/s LVOT Vmean:        63.300 cm/s LVOT VTI:          0.195 m LVOT/AV VTI ratio: 0.57  AORTA Ao Root diam: 2.50 cm MITRAL VALVE MV Area (PHT): 2.71 cm    SHUNTS MV Area VTI:   2.75 cm    Systemic VTI:  0.20 m MV Peak grad:  3.3 mmHg    Systemic Diam: 2.10 cm MV Mean grad:  1.0 mmHg MV Vmax:       0.90 m/s MV Vmean:      47.1 cm/s MV Decel Time: 280 msec MV E velocity: 81.40 cm/s MV A velocity: 76.70 cm/s MV E/A ratio:  1.06 Cristal Deer End MD Electronically signed by Yvonne Kendall MD Signature Date/Time: 07/24/2021/12:32:26 PM    Final    [place] EKG SITE RITE  Result Date: 07/23/2021 If Site Rite image not attached, placement could not be confirmed due to current cardiac rhythm.    TODAY-DAY OF DISCHARGE:  Subjective:   Johnny Dunlap today has no headache,no chest abdominal pain,no new weakness tingling or numbness, feels much better .  Objective:   Blood pressure (!) 99/57, pulse 67, temperature 98 F (36.7 C), resp. rate 20, height 6' (1.829 m), weight 68.4 kg, SpO2 99 %.  Intake/Output Summary (Last 24 hours) at 08/15/2021 0945 Last data filed at 08/15/2021 0433 Gross per 24 hour  Intake 360 ml  Output 1025 ml  Net -665 ml   Filed Weights   08/01/21 1613 08/10/21 1517  Weight: 67.1 kg 68.4 kg    Exam: Awake Alert, No new F.N deficits, Normal affect [place].AT,PERRAL Supple Neck,No JVD, No cervical lymphadenopathy appriciated.  Symmetrical Chest wall movement, Good air movement bilaterally, CTAB RRR,No Gallops,Rubs or new Murmurs, No Parasternal Heave +ve B.Sounds, Abd Soft, Non tender, No organomegaly appriciated, No rebound -guarding or rigidity. No Cyanosis, Clubbing or edema, No new Rash or bruise   PERTINENT RADIOLOGIC STUDIES: No results found.   PERTINENT LAB RESULTS: CBC: Recent Labs    08/13/21 0442  WBC 4.2  HGB 8.2*  HCT 25.4*  PLT 66*   CMET CMP      Component Value Date/Time   NA 139 08/13/2021 0442   K 3.8 08/13/2021 0442   CL 112 (H) 08/13/2021 0442   CO2 23 08/13/2021 0442   GLUCOSE 87 08/13/2021 0442   BUN 17 08/13/2021 0442   CREATININE 0.71 08/13/2021 0442   CALCIUM 8.0 (L) 08/13/2021 0442   PROT 4.8 (L) 08/03/2021 0507   ALBUMIN 1.8 (L) 08/03/2021 0507   AST 44 (H) 08/03/2021 0507   ALT 19 08/03/2021 0507   ALKPHOS 115 08/03/2021 0507   BILITOT 3.8 (H) 08/03/2021 0507   GFRNONAA >60 08/13/2021 0442    GFR Estimated Creatinine Clearance: 85.5 mL/min (by C-G formula based on SCr of 0.71 mg/dL). No results for input(s): LIPASE, AMYLASE in the last 72 hours. No results for input(s):  CKTOTAL, CKMB, CKMBINDEX, TROPONINI in the last 72 hours. Invalid input(s): POCBNP No results for input(s): DDIMER in the last 72 hours. No results for input(s): HGBA1C in the last 72 hours. No results for input(s): CHOL, HDL, LDLCALC, TRIG, CHOLHDL, LDLDIRECT in the last 72 hours.  Total Time spent coordinating discharge including counseling, education and face to face time equals 35 minutes.  Signed: Susa Raring 08/15/2021 9:45 AM

## 2021-08-08 NOTE — TOC Progression Note (Signed)
Transition of Care [facility]) - Progression Note    Patient Details  Name: Johnny Dunlap MRN: [id number] Date of Birth: 02/21/1950  [facility] CM/SW Contact  Allayne Butcher, RN Phone Number: 08/08/2021, 3:08 PM  Clinical Narrative:    Authorization for SNF still pending.     Expected Discharge Plan: Skilled Nursing Facility Barriers to Discharge: Continued Medical Work up  Expected Discharge Plan and Services Expected Discharge Plan: Skilled Nursing Facility   Discharge Planning Services: CM Consult Post Acute Care Choice: Skilled Nursing Facility Living arrangements for the past 2 months: Homeless Expected Discharge Date: 08/08/21               DME Arranged: N/A         HH Arranged: NA           Social Determinants of Health (SDOH) Interventions    Readmission Risk Interventions No flowsheet data found.

## 2021-08-08 NOTE — Care Management Important Message (Signed)
Important Message  Patient Details  Name: Johnny Dunlap MRN: [id number] Date of Birth: 11/04/1950   Medicare Important Message Given:  Yes  Patient was sleeping so I left a copy on his bedside table to review at his convenience.   [name] A Pierra Skora 08/08/2021, 11:03 AM

## 2021-08-09 DIAGNOSIS — L03114 Cellulitis of left upper limb: Secondary | ICD-10-CM | POA: Diagnosis not present

## 2021-08-09 LAB — BASIC METABOLIC PANEL
Anion gap: 6 (ref 5–15)
BUN: 17 mg/dL (ref 8–23)
CO2: 24 mmol/L (ref 22–32)
Calcium: 8 mg/dL — ABNORMAL LOW (ref 8.9–10.3)
Chloride: 107 mmol/L (ref 98–111)
Creatinine, Ser: 0.85 mg/dL (ref 0.61–1.24)
GFR, Estimated: >60 mL/min (ref >60)
Glucose, Bld: 80 mg/dL (ref 70–99)
Potassium: 4.2 mmol/L (ref 3.5–5.1)
Sodium: 137 mmol/L (ref 135–145)

## 2021-08-09 NOTE — Progress Notes (Addendum)
PROGRESS NOTE        PATIENT DETAILS Name: Johnny Dunlap Age: 71 y.o. Sex: male Date of Birth: 07-22-1950 Admit Date: 08/01/2021 Admitting Physician Alford Highland, MD PCP:Pcp, No  Brief Narrative: Patient is a 71 y.o. male with history of liver cirrhosis, COPD, recent COVID 19 infection-presenting with left forearm cellulitis.  Significant events: 8/2>> admit for left forearm soft tissue infection  Significant studies: 8/2>> x-ray chest: No pneumonia-small right pleural effusion 8/2>> x-ray left elbow/left wrist: Diffuse soft tissue swelling 8/3>> x-ray left forearm: Diffuse soft tissue swelling  Antimicrobial therapy: Ancef: 8/3>>8/7 Keflex: 8/7>>  Microbiology data: 8/2>> blood culture 1/2: Staph hominis  Procedures : None  Consults: Orthopedics  DVT Prophylaxis : SCDs Start: 08/01/21 1807 Place TED hose Start: 08/01/21 1807   Subjective: Left medial proximal forearm swelling has significantly improved.  Very minimal erythema if at all present.  Lower extremity edema has improved.  Assessment/Plan: Sepsis due to left forearm cellulitis: Sepsis physiology has improved-left forearm cellulitis has markedly improved-very minimal erythema present.  Has completed a course of antimicrobial therapy.  1/2 blood cultures positive for staph hominis which is probably a contamination and of no clinical significance.    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  Further optimization will need to be done by outpatient MD.  Continue to monitor electrolytes closely.    Recent COVID-19 infection: Currently asymptomatic-off isolation as of 8/4  Hypokalemia/hypomagnesemia: Repleted-follow periodically.  Homelessness: will need CM/SW involvement prior to discharge.   Diet: Diet Order             Diet - low sodium heart  healthy           Diet regular Room service appropriate? Yes; Fluid consistency: Thin  Diet effective now                    Code Status: Full code   Family Communication: None at bedside  Disposition Plan: Status is: Inpatient  Remains inpatient appropriate because:Inpatient level of care appropriate due to severity of illness  Dispo: The patient is from: Home              Anticipated d/c is to: Home              Patient currently is medically stable to d/c.   Difficult to place patient No   Barriers to Discharge: Awaiting SNF placement  Antimicrobial agents: Anti-infectives (From admission, onward)    Start     Dose/Rate Route Frequency Ordered Stop   08/06/21 1415  cephALEXin (KEFLEX) capsule 500 mg        500 mg Oral Every 8 hours 08/06/21 1324 08/09/21 0506   08/02/21 1600  ceFAZolin (ANCEF) IVPB 2g/100 mL premix  Status:  Discontinued        2 g 200 mL/hr over 30 Minutes Intravenous Every 8 hours 08/01/21 1820 08/06/21 1324   08/01/21 2200  ceFAZolin (ANCEF) IVPB 2g/100 mL premix  Status:  Discontinued        2 g 200 mL/hr over 30 Minutes Intravenous Every 8 hours 08/01/21 1804 08/01/21 1820   08/01/21 1500  vancomycin (VANCOCIN) IVPB 1000 mg/200 mL premix        1,000 mg 200 mL/hr over 60 Minutes Intravenous  Once 08/01/21 1449 08/01/21 1730   08/01/21 1500  cefTRIAXone (ROCEPHIN) 2 g in sodium chloride 0.9 % 100 mL IVPB        2 g 200 mL/hr over 30 Minutes Intravenous  Once 08/01/21 1449 08/01/21 1613        Time spent: 15 minutes-Greater than 50% of this time was spent in counseling, explanation of diagnosis, planning of further management, and coordination of care.  MEDICATIONS: Scheduled Meds:  feeding supplement  237 mL Oral Q24H   furosemide  40 mg Oral Daily   midodrine  10 mg Oral TID WC   multivitamin with minerals  1 tablet Oral Daily   spironolactone  100 mg Oral Daily   Continuous Infusions:   PRN Meds:.acetaminophen **OR**  acetaminophen, albuterol, ondansetron **OR** ondansetron (ZOFRAN) IV, oxyCODONE   PHYSICAL EXAM: Vital signs: Vitals:   08/10/21 0019 08/10/21 0404 08/10/21 0728 08/10/21 1229  BP: (!) 103/56 93/62 118/61 114/61  Pulse: 61 71 62 83  Resp: 17 17 20 20   Temp: 98.6 F (37 C) 98.5 F (36.9 C) 98.2 F (36.8 C) 98.2 F (36.8 C)  TempSrc: Oral  Oral Oral  SpO2: 98% 97% 99% 94%  Weight:      Height:       Filed Weights   08/01/21 1613  Weight: 67.1 kg   Body mass index is 20.07 kg/m.   Gen Exam:Alert awake-not in any distress HEENT:atraumatic, normocephalic Chest: B/L clear to auscultation anteriorly CVS:S1S2 regular Abdomen:soft non tender, distended with ascites but not tense. Extremities: + Edema Neurology: Non focal Skin: no rash   I have personally reviewed following labs and imaging studies  LABORATORY DATA: CBC: No results for input(s): WBC, NEUTROABS, HGB, HCT, MCV, PLT in the last 168 hours.  Basic Metabolic Panel: Recent Labs  Lab 08/04/21 0432 08/05/21 0553 08/06/21 0500 08/07/21 0430 08/08/21 0748 08/09/21 0453  NA 135 134* 136 137 137 137  K 3.4* 3.5 3.5 4.0 3.8 4.2  CL 108 107 108 109 107 107  CO2 24 22 24 25 26 24   GLUCOSE 89 115* 84 85 95 80  BUN 13 12 11 19 19 17   CREATININE 0.81 0.80 0.70 0.83 0.92 0.85  CALCIUM 7.4* 7.4* 7.3* 7.8* 7.8* 8.0*  MG 1.6* 1.8 1.7 1.7  --   --     GFR: Estimated Creatinine Clearance: 78.9 mL/min (by C-G formula based on SCr of 0.85 mg/dL).  Liver Function Tests: No results for input(s): AST, ALT, ALKPHOS, BILITOT, PROT, ALBUMIN in the last 168 hours. No results for input(s): LIPASE, AMYLASE in the last 168 hours. No results for input(s): AMMONIA in the last 168 hours.  Coagulation Profile: No results for input(s): INR, PROTIME in the last 168 hours.   Cardiac Enzymes: No results for input(s): CKTOTAL, CKMB, CKMBINDEX, TROPONINI in the last 168 hours.   BNP (last 3 results) No results for input(s):  PROBNP in the last 8760 hours.  Lipid Profile: No results for input(s): CHOL, HDL, LDLCALC, TRIG, CHOLHDL, LDLDIRECT in the last 72 hours.  Thyroid Function Tests: No results for input(s): TSH, T4TOTAL, FREET4, T3FREE, THYROIDAB in the last 72 hours.  Anemia Panel: No results for input(s): VITAMINB12, FOLATE, FERRITIN, TIBC, IRON, RETICCTPCT in the last 72 hours.  Urine analysis: No results found for: COLORURINE, APPEARANCEUR, LABSPEC, PHURINE, GLUCOSEU, HGBUR, BILIRUBINUR, KETONESUR, PROTEINUR, UROBILINOGEN, NITRITE, LEUKOCYTESUR  Sepsis Labs: Lactic Acid, Venous    Component Value Date/Time   LATICACIDVEN 1.8 08/02/2021 0506    MICROBIOLOGY: Recent Results (from the past 240 hour(s))  Resp Panel  by RT-PCR (Flu A&B, Covid) Nasopharyngeal Swab     Status: Abnormal   Collection Time: 08/01/21  3:03 PM   Specimen: Nasopharyngeal Swab; Nasopharyngeal(NP) swabs in vial transport medium  Result Value Ref Range Status   SARS Coronavirus 2 by RT PCR POSITIVE (A) NEGATIVE Final    Comment: RESULT CALLED TO, READ BACK BY AND VERIFIED WITH: KARA BRIDGES 08/01/21 1624 KLW (NOTE) SARS-CoV-2 target nucleic acids are DETECTED.  The SARS-CoV-2 RNA is generally detectable in upper respiratory specimens during the acute phase of infection. Positive results are indicative of the presence of the identified virus, but do not rule out bacterial infection or co-infection with other pathogens not detected by the test. Clinical correlation with patient history and other diagnostic information is necessary to determine patient infection status. The expected result is Negative.  Fact Sheet for Patients: BloggerCourse.comhttps://www.fda.gov/media/152166/download  Fact Sheet for Healthcare Providers: SeriousBroker.ithttps://www.fda.gov/media/152162/download  This test is not yet approved or cleared by the [place] FDA and  has been authorized for detection and/or diagnosis of SARS-CoV-2 by FDA under an Emergency Use  Authorization (EUA).  This EUA will remain in effect (meaning this test can be used ) for the duration of  the COVID-19 declaration under Section 564(b)(1) of the Act, 21 U.S.C. section 360bbb-3(b)(1), unless the authorization is terminated or revoked sooner.     Influenza A by PCR NEGATIVE NEGATIVE Final   Influenza B by PCR NEGATIVE NEGATIVE Final    Comment: (NOTE) The Xpert Xpress SARS-CoV-2/FLU/RSV plus assay is intended as an aid in the diagnosis of influenza from Nasopharyngeal swab specimens and should not be used as a sole basis for treatment. Nasal washings and aspirates are unacceptable for Xpert Xpress SARS-CoV-2/FLU/RSV testing.  Fact Sheet for Patients: BloggerCourse.comhttps://www.fda.gov/media/152166/download  Fact Sheet for Healthcare Providers: SeriousBroker.ithttps://www.fda.gov/media/152162/download  This test is not yet approved or cleared by the [place] FDA and has been authorized for detection and/or diagnosis of SARS-CoV-2 by FDA under an Emergency Use Authorization (EUA). This EUA will remain in effect (meaning this test can be used) for the duration of the COVID-19 declaration under Section 564(b)(1) of the Act, 21 U.S.C. section 360bbb-3(b)(1), unless the authorization is terminated or revoked.  Performed at [facility], [address] [place], [place] [address]   Blood Culture (routine x 2)     Status: Abnormal   Collection Time: 08/01/21  3:03 PM   Specimen: BLOOD  Result Value Ref Range Status   Specimen Description   Final    BLOOD BLOOD RIGHT FOREARM Performed at [facility], [address] [place], [place] [address]    Special Requests   Final    BOTTLES DRAWN AEROBIC AND ANAEROBIC Blood Culture adequate volume Performed at [facility], [address] [place], [place] [address]    Culture  Setup Time   Final    GRAM POSITIVE COCCI ANAEROBIC BOTTLE ONLY CRITICAL RESULT CALLED TO, READ BACK BY AND VERIFIED WITH: DEVIN MITCHELL  @0835  08/02/21 SCS    Culture (A)  Final    STAPHYLOCOCCUS HOMINIS THE SIGNIFICANCE OF ISOLATING THIS ORGANISM FROM A SINGLE SET OF BLOOD CULTURES WHEN MULTIPLE SETS ARE DRAWN IS UNCERTAIN. PLEASE NOTIFY THE MICROBIOLOGY DEPARTMENT WITHIN ONE WEEK IF SPECIATION AND SENSITIVITIES ARE REQUIRED. Performed at [facility], [address] [place], [place] [address]    Report Status 08/04/2021 FINAL  Final  Blood Culture (routine x 2)     Status: None   Collection Time: 08/01/21  3:03  PM   Specimen: BLOOD  Result Value Ref Range Status   Specimen Description BLOOD RIGHT ANTECUBITAL  Final   Special Requests   Final    BOTTLES DRAWN AEROBIC AND ANAEROBIC Blood Culture adequate volume   Culture   Final    NO GROWTH 5 DAYS Performed at [facility] [address] [place], [place] [address]    Report Status 08/06/2021 FINAL  Final  Blood Culture ID Panel (Reflexed)     Status: Abnormal   Collection Time: 08/01/21  3:03 PM  Result Value Ref Range Status   Enterococcus faecalis NOT DETECTED NOT DETECTED Final   Enterococcus Faecium NOT DETECTED NOT DETECTED Final   Listeria monocytogenes NOT DETECTED NOT DETECTED Final   Staphylococcus species DETECTED (A) NOT DETECTED Final    Comment: CRITICAL RESULT CALLED TO, READ BACK BY AND VERIFIED WITH: DEVIN MITCHELL @0835  08/02/21 SCS    Staphylococcus aureus (BCID) NOT DETECTED NOT DETECTED Final   Staphylococcus epidermidis NOT DETECTED NOT DETECTED Final   Staphylococcus lugdunensis NOT DETECTED NOT DETECTED Final   Streptococcus species NOT DETECTED NOT DETECTED Final   Streptococcus agalactiae NOT DETECTED NOT DETECTED Final   Streptococcus pneumoniae NOT DETECTED NOT DETECTED Final   Streptococcus pyogenes NOT DETECTED NOT DETECTED Final   A.calcoaceticus-baumannii NOT DETECTED NOT DETECTED Final   Bacteroides fragilis NOT DETECTED NOT DETECTED Final   Enterobacterales NOT DETECTED NOT DETECTED Final   Enterobacter cloacae  complex NOT DETECTED NOT DETECTED Final   Escherichia coli NOT DETECTED NOT DETECTED Final   Klebsiella aerogenes NOT DETECTED NOT DETECTED Final   Klebsiella oxytoca NOT DETECTED NOT DETECTED Final   Klebsiella pneumoniae NOT DETECTED NOT DETECTED Final   Proteus species NOT DETECTED NOT DETECTED Final   Salmonella species NOT DETECTED NOT DETECTED Final   Serratia marcescens NOT DETECTED NOT DETECTED Final   Haemophilus influenzae NOT DETECTED NOT DETECTED Final   Neisseria meningitidis NOT DETECTED NOT DETECTED Final   Pseudomonas aeruginosa NOT DETECTED NOT DETECTED Final   Stenotrophomonas maltophilia NOT DETECTED NOT DETECTED Final   Candida albicans NOT DETECTED NOT DETECTED Final   Candida auris NOT DETECTED NOT DETECTED Final   Candida glabrata NOT DETECTED NOT DETECTED Final   Candida krusei NOT DETECTED NOT DETECTED Final   Candida parapsilosis NOT DETECTED NOT DETECTED Final   Candida tropicalis NOT DETECTED NOT DETECTED Final   Cryptococcus neoformans/gattii NOT DETECTED NOT DETECTED Final    Comment: Performed at [facility], [address], [place], [place]    RADIOLOGY STUDIES/RESULTS: No results found.   LOS: 9 days   [id number], MD  [facility]    To contact the attending provider between 7A-7P or the covering provider during after hours 7P-7A, please log into the web site [email or web address] and access using universal [place] password for that web site. If you do not have the password, please call the hospital operator.  08/10/2021, 1:03 PM

## 2021-08-09 NOTE — Plan of Care (Signed)
Patient orientedx4, VSS. Adequate UO. Largely wanting to rest uninterrupted overnight. Fall/safety precautions in place, rounding performed, needs/concerns addressed during shift.   Problem: Education: Goal: Knowledge of General Education information will improve Description: Including pain rating scale, medication(s)/side effects and non-pharmacologic comfort measures Outcome: Progressing   Problem: Health Behavior/Discharge Planning: Goal: Ability to manage health-related needs will improve Outcome: Progressing   Problem: Clinical Measurements: Goal: Ability to maintain clinical measurements within normal limits will improve Outcome: Progressing Goal: Will remain free from infection Outcome: Progressing Goal: Diagnostic test results will improve Outcome: Progressing Goal: Respiratory complications will improve Outcome: Progressing Goal: Cardiovascular complication will be avoided Outcome: Progressing   Problem: Activity: Goal: Risk for activity intolerance will decrease Outcome: Progressing   Problem: Nutrition: Goal: Adequate nutrition will be maintained Outcome: Progressing   Problem: Coping: Goal: Level of anxiety will decrease Outcome: Progressing   Problem: Elimination: Goal: Will not experience complications related to bowel motility Outcome: Progressing Goal: Will not experience complications related to urinary retention Outcome: Progressing   Problem: Pain Managment: Goal: General experience of comfort will improve Outcome: Progressing   Problem: Safety: Goal: Ability to remain free from injury will improve Outcome: Progressing   Problem: Skin Integrity: Goal: Risk for impaired skin integrity will decrease Outcome: Progressing

## 2021-08-09 NOTE — TOC Progression Note (Signed)
Transition of Care [facility]) - Progression Note    Patient Details  Name: Johnny Dunlap MRN: [id number] Date of Birth: 1950/02/06  Transition of Care [facility] CM/SW Contact  Allayne Butcher, RN Phone Number: 08/09/2021, 3:09 PM  Clinical Narrative:    RNCM followed up with Nada Maclachlan, they are still waiting on insurance auth from [facility].     Expected Discharge Plan: Skilled Nursing Facility Barriers to Discharge: Continued Medical Work up  Expected Discharge Plan and Services Expected Discharge Plan: Skilled Nursing Facility   Discharge Planning Services: CM Consult Post Acute Care Choice: Skilled Nursing Facility Living arrangements for the past 2 months: Homeless Expected Discharge Date: 08/08/21               DME Arranged: N/A         HH Arranged: NA           Social Determinants of Health (SDOH) Interventions    Readmission Risk Interventions No flowsheet data found.

## 2021-08-10 DIAGNOSIS — L03114 Cellulitis of left upper limb: Secondary | ICD-10-CM | POA: Diagnosis not present

## 2021-08-10 MED ORDER — ENSURE ENLIVE PO LIQD
237.0000 mL | Freq: Two times a day (BID) | ORAL | Status: DC
  Administered 2021-08-10 – 2021-08-15 (×8): 237 mL via ORAL

## 2021-08-10 NOTE — Care Management Important Message (Signed)
Important Message  Patient Details  Name: Johnny Dunlap MRN: [id number] Date of Birth: [birth year]   Medicare Important Message Given:  Yes  Tried obtaining a signature again but patient was sleeping.  Left another copy on his bedside table for him to review.   [name] A Deundra Bard 08/10/2021, 10:48 AM

## 2021-08-10 NOTE — Progress Notes (Signed)
Nutrition Follow-up  DOCUMENTATION CODES:  Severe malnutrition in context of social or environmental circumstances  INTERVENTION:  Continue current diet as ordered, encourage PO intake. Increase Ensure Enlive po to 2x/d, each supplement provides 350 kcal and 20 grams of protein MVI with minerals daily  NUTRITION DIAGNOSIS:  Severe Malnutrition related to social / environmental circumstances as evidenced by severe fat depletion, severe muscle depletion.  GOAL:  Patient will meet greater than or equal to 90% of their needs  MONITOR:  PO intake, Supplement acceptance  REASON FOR ASSESSMENT:  Malnutrition Screening Tool    ASSESSMENT:  71 year old male with past medical history of COPD and cirrhosis presented to the ED with left arm pain and swelling worsening for the last few weeks. Recently found to be COVID19 positive.  Work-up in ED consistent with sepsis due to left arm cellulitis. Lactic acid elevated, resolved with gentle hydration. Pt was started on IV antibiotics.   Pt medically stable for dc, waiting on insurance authorization to dc to facility.   Pt resting in bed at the time of visit, eating a sandwich. Pt reports an excellent appetite this admission and that he likes the ensure he is receiving, chocolate is the preferred flavor. States he is feeling well today and has no complaints.   Average Meal Intake: 8/2-8/3: 100% intake x 1 recorded meals 8/4-8/11: 100% intake x 5 recorded meals  Nutritionally Relevant Medications: Scheduled Meds:  feeding supplement  237 mL Oral Q24H   furosemide  40 mg Oral Daily   multivitamin with minerals  1 tablet Oral Daily   spironolactone  100 mg Oral Daily   PRN Meds: ondansetron  Labs Reviewed  NUTRITION - FOCUSED PHYSICAL EXAM: Flowsheet Row Most Recent Value  Orbital Region Severe depletion  Upper Arm Region Severe depletion  Thoracic and Lumbar Region Moderate depletion  Buccal Region Severe depletion  Temple Region  Severe depletion  Clavicle Bone Region Severe depletion  Clavicle and Acromion Bone Region Severe depletion  Scapular Bone Region Severe depletion  Dorsal Hand Severe depletion  Patellar Region Moderate depletion  Anterior Thigh Region Moderate depletion  Posterior Calf Region Moderate depletion  Edema (RD Assessment) Mild  Hair Reviewed  Eyes Reviewed  Mouth Reviewed  Skin Reviewed  Nails Reviewed   Diet Order:   Diet Order             Diet - low sodium heart healthy           Diet regular Room service appropriate? Yes; Fluid consistency: Thin  Diet effective now                  EDUCATION NEEDS:  No education needs have been identified at this time  Skin:  Skin Assessment: Reviewed RN Assessment  Last BM:  8/10 - type 4  Height:  Ht Readings from Last 1 Encounters:  08/01/21 6' (1.829 m)   Weight:  Wt Readings from Last 1 Encounters:  08/10/21 68.4 kg    Ideal Body Weight:  80.9 kg  BMI:  Body mass index is 20.45 kg/m.  Estimated Nutritional Needs:  Kcal:  1800-2000 kcal/d Protein:  90-100 g/d Fluid:  2L/d   Greig Castilla, RD, LDN Clinical Dietitian Pager on Amion

## 2021-08-10 NOTE — Progress Notes (Addendum)
PROGRESS NOTE        PATIENT DETAILS Name: Lenna GilfordRaymond Mcguffin Age: 71 y.o. Sex: male Date of Birth: [birth year] Admit Date: 08/01/2021 Admitting Physician Alford Highlandichard Wieting, MD PCP:Pcp, No  Brief Narrative: Patient is a 71 y.o. male with history of liver cirrhosis, COPD, recent COVID 19 infection-presenting with left forearm cellulitis.  Significant events: 8/2>> admit for left forearm soft tissue infection  Significant studies: 8/2>> x-ray chest: No pneumonia-small right pleural effusion 8/2>> x-ray left elbow/left wrist: Diffuse soft tissue swelling 8/3>> x-ray left forearm: Diffuse soft tissue swelling  Antimicrobial therapy: Ancef: 8/3>>8/7 Keflex: 8/7>> 8/9  Microbiology data: 8/2>> blood culture 1/2: Staph hominis  Procedures : None  Consults: Orthopedics  DVT Prophylaxis : SCDs Start: 08/01/21 1807 Place TED hose Start: 08/01/21 1807   Subjective: Left forearm has normalized.  Leg swelling has improved.  Per nursing staff-has refused some doses of diuretics because he urinates too much.  Assessment/Plan: Sepsis due to left forearm cellulitis: Sepsis physiology has improved-left forearm cellulitis has markedly improved-very minimal erythema present.  Has completed a course of antimicrobial therapy.  1/2 blood cultures positive for staph hominis which is probably a contamination and of no clinical significance.    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  Further optimization will need to be done by outpatient MD.  Continue to monitor electrolytes closely.    Recent COVID-19 infection: Currently asymptomatic-off isolation as of 8/4  Hypokalemia/hypomagnesemia: Repleted-follow periodically.  Homelessness: will need CM/SW involvement prior to discharge.  Nutrition Status: Nutrition Problem: Severe  Malnutrition Etiology: social / environmental circumstances Signs/Symptoms: severe fat depletion, severe muscle depletion Interventions: Ensure Enlive (each supplement provides 350kcal and 20 grams of protein), MVI, Magic cup     Diet: Diet Order             Diet - low sodium heart healthy           Diet regular Room service appropriate? Yes; Fluid consistency: Thin  Diet effective now                    Code Status: Full code   Family Communication: None at bedside  Disposition Plan: Status is: Inpatient  Remains inpatient appropriate because:Inpatient level of care appropriate due to severity of illness  Dispo: The patient is from: Home              Anticipated d/c is to: Home              Patient currently is medically stable to d/c.   Difficult to place patient No   Barriers to Discharge: Awaiting SNF placement  Antimicrobial agents: Anti-infectives (From admission, onward)    Start     Dose/Rate Route Frequency Ordered Stop   08/06/21 1415  cephALEXin (KEFLEX) capsule 500 mg        500 mg Oral Every 8 hours 08/06/21 1324 08/09/21 0506   08/02/21 1600  ceFAZolin (ANCEF) IVPB 2g/100 mL premix  Status:  Discontinued        2 g 200 mL/hr over 30 Minutes Intravenous Every 8 hours 08/01/21 1820 08/06/21 1324   08/01/21 2200  ceFAZolin (ANCEF) IVPB 2g/100 mL premix  Status:  Discontinued        2 g 200 mL/hr over 30 Minutes Intravenous Every 8 hours 08/01/21 1804 08/01/21  1820   08/01/21 1500  vancomycin (VANCOCIN) IVPB 1000 mg/200 mL premix        1,000 mg 200 mL/hr over 60 Minutes Intravenous  Once 08/01/21 1449 08/01/21 1730   08/01/21 1500  cefTRIAXone (ROCEPHIN) 2 g in sodium chloride 0.9 % 100 mL IVPB        2 g 200 mL/hr over 30 Minutes Intravenous  Once 08/01/21 1449 08/01/21 1613        Time spent: 15 minutes-Greater than 50% of this time was spent in counseling, explanation of diagnosis, planning of further management, and coordination of  care.  MEDICATIONS: Scheduled Meds:  feeding supplement  237 mL Oral Q24H   furosemide  40 mg Oral Daily   midodrine  10 mg Oral TID WC   multivitamin with minerals  1 tablet Oral Daily   spironolactone  100 mg Oral Daily   Continuous Infusions:   PRN Meds:.acetaminophen **OR** acetaminophen, albuterol, ondansetron **OR** ondansetron (ZOFRAN) IV, oxyCODONE   PHYSICAL EXAM: Vital signs: Vitals:   08/09/21 2025 08/10/21 0019 08/10/21 0404 08/10/21 0728  BP: 96/65 (!) 103/56 93/62 118/61  Pulse: 67 61 71 62  Resp: 18 17 17 20   Temp: 98.4 F (36.9 C) 98.6 F (37 C) 98.5 F (36.9 C) 98.2 F (36.8 C)  TempSrc: Oral Oral  Oral  SpO2: 97% 98% 97% 99%  Weight:      Height:       Filed Weights   08/01/21 1613  Weight: 67.1 kg   Body mass index is 20.07 kg/m.   Gen Exam:Alert awake-not in any distress HEENT:atraumatic, normocephalic Chest: B/L clear to auscultation anteriorly CVS:S1S2 regular Abdomen:soft non tender, non distended Extremities:+ edema Neurology: Non focal Skin: no rash   I have personally reviewed following labs and imaging studies  LABORATORY DATA: CBC: No results for input(s): WBC, NEUTROABS, HGB, HCT, MCV, PLT in the last 168 hours.   Basic Metabolic Panel: Recent Labs  Lab 08/04/21 0432 08/05/21 0553 08/06/21 0500 08/07/21 0430 08/08/21 0748 08/09/21 0453  NA 135 134* 136 137 137 137  K 3.4* 3.5 3.5 4.0 3.8 4.2  CL 108 107 108 109 107 107  CO2 24 22 24 25 26 24   GLUCOSE 89 115* 84 85 95 80  BUN 13 12 11 19 19 17   CREATININE 0.81 0.80 0.70 0.83 0.92 0.85  CALCIUM 7.4* 7.4* 7.3* 7.8* 7.8* 8.0*  MG 1.6* 1.8 1.7 1.7  --   --      GFR: Estimated Creatinine Clearance: 78.9 mL/min (by C-G formula based on SCr of 0.85 mg/dL).  Liver Function Tests: No results for input(s): AST, ALT, ALKPHOS, BILITOT, PROT, ALBUMIN in the last 168 hours.  No results for input(s): LIPASE, AMYLASE in the last 168 hours. No results for input(s): AMMONIA  in the last 168 hours.  Coagulation Profile: No results for input(s): INR, PROTIME in the last 168 hours.   Cardiac Enzymes: No results for input(s): CKTOTAL, CKMB, CKMBINDEX, TROPONINI in the last 168 hours.   BNP (last 3 results) No results for input(s): PROBNP in the last 8760 hours.  Lipid Profile: No results for input(s): CHOL, HDL, LDLCALC, TRIG, CHOLHDL, LDLDIRECT in the last 72 hours.  Thyroid Function Tests: No results for input(s): TSH, T4TOTAL, FREET4, T3FREE, THYROIDAB in the last 72 hours.  Anemia Panel: No results for input(s): VITAMINB12, FOLATE, FERRITIN, TIBC, IRON, RETICCTPCT in the last 72 hours.  Urine analysis: No results found for: COLORURINE, APPEARANCEUR, LABSPEC, PHURINE, GLUCOSEU, HGBUR, BILIRUBINUR,  KETONESUR, PROTEINUR, UROBILINOGEN, NITRITE, LEUKOCYTESUR  Sepsis Labs: Lactic Acid, Venous    Component Value Date/Time   LATICACIDVEN 1.8 08/02/2021 0506    MICROBIOLOGY: Recent Results (from the past 240 hour(s))  Resp Panel by RT-PCR (Flu A&B, Covid) Nasopharyngeal Swab     Status: Abnormal   Collection Time: 08/01/21  3:03 PM   Specimen: Nasopharyngeal Swab; Nasopharyngeal(NP) swabs in vial transport medium  Result Value Ref Range Status   SARS Coronavirus 2 by RT PCR POSITIVE (A) NEGATIVE Final    Comment: RESULT CALLED TO, READ BACK BY AND VERIFIED WITH: KARA BRIDGES 08/01/21 1624 KLW (NOTE) SARS-CoV-2 target nucleic acids are DETECTED.  The SARS-CoV-2 RNA is generally detectable in upper respiratory specimens during the acute phase of infection. Positive results are indicative of the presence of the identified virus, but do not rule out bacterial infection or co-infection with other pathogens not detected by the test. Clinical correlation with patient history and other diagnostic information is necessary to determine patient infection status. The expected result is Negative.  Fact Sheet for  Patients: BloggerCourse.com  Fact Sheet for Healthcare Providers: SeriousBroker.it  This test is not yet approved or cleared by the [place] FDA and  has been authorized for detection and/or diagnosis of SARS-CoV-2 by FDA under an Emergency Use Authorization (EUA).  This EUA will remain in effect (meaning this test can be used ) for the duration of  the COVID-19 declaration under Section 564(b)(1) of the Act, 21 U.S.C. section 360bbb-3(b)(1), unless the authorization is terminated or revoked sooner.     Influenza A by PCR NEGATIVE NEGATIVE Final   Influenza B by PCR NEGATIVE NEGATIVE Final    Comment: (NOTE) The Xpert Xpress SARS-CoV-2/FLU/RSV plus assay is intended as an aid in the diagnosis of influenza from Nasopharyngeal swab specimens and should not be used as a sole basis for treatment. Nasal washings and aspirates are unacceptable for Xpert Xpress SARS-CoV-2/FLU/RSV testing.  Fact Sheet for Patients: BloggerCourse.com  Fact Sheet for Healthcare Providers: SeriousBroker.it  This test is not yet approved or cleared by the [place] FDA and has been authorized for detection and/or diagnosis of SARS-CoV-2 by FDA under an Emergency Use Authorization (EUA). This EUA will remain in effect (meaning this test can be used) for the duration of the COVID-19 declaration under Section 564(b)(1) of the Act, 21 U.S.C. section 360bbb-3(b)(1), unless the authorization is terminated or revoked.  Performed at [facility], [address], [place], [place] [address]   Blood Culture (routine x 2)     Status: Abnormal   Collection Time: 08/01/21  3:03 PM   Specimen: BLOOD  Result Value Ref Range Status   Specimen Description   Final    BLOOD BLOOD RIGHT FOREARM Performed at [facility], [address] [place], [place] [address]    Special Requests   Final     BOTTLES DRAWN AEROBIC AND ANAEROBIC Blood Culture adequate volume Performed at [facility], [address] [place], [place] [address]    Culture  Setup Time   Final    GRAM POSITIVE COCCI ANAEROBIC BOTTLE ONLY CRITICAL RESULT CALLED TO, READ BACK BY AND VERIFIED WITH: DEVIN MITCHELL @0835  08/02/21 SCS    Culture (A)  Final    STAPHYLOCOCCUS HOMINIS THE SIGNIFICANCE OF ISOLATING THIS ORGANISM FROM A SINGLE SET OF BLOOD CULTURES WHEN MULTIPLE SETS ARE DRAWN IS UNCERTAIN. PLEASE NOTIFY THE MICROBIOLOGY DEPARTMENT WITHIN ONE WEEK IF SPECIATION AND SENSITIVITIES ARE REQUIRED. Performed at [facility], [address], [place], [place] [address]    Report Status 08/04/2021 FINAL  Final  Blood Culture (routine x 2)     Status: None   Collection Time: 08/01/21  3:03 PM   Specimen: BLOOD  Result Value Ref Range Status   Specimen Description BLOOD RIGHT ANTECUBITAL  Final   Special Requests   Final    BOTTLES DRAWN AEROBIC AND ANAEROBIC Blood Culture adequate volume   Culture   Final    NO GROWTH 5 DAYS Performed at [facility], [address] [place], [place] [address]    Report Status 08/06/2021 FINAL  Final  Blood Culture ID Panel (Reflexed)     Status: Abnormal   Collection Time: 08/01/21  3:03 PM  Result Value Ref Range Status   Enterococcus faecalis NOT DETECTED NOT DETECTED Final   Enterococcus Faecium NOT DETECTED NOT DETECTED Final   Listeria monocytogenes NOT DETECTED NOT DETECTED Final   Staphylococcus species DETECTED (A) NOT DETECTED Final    Comment: CRITICAL RESULT CALLED TO, READ BACK BY AND VERIFIED WITH: DEVIN MITCHELL @0835  08/02/21 SCS    Staphylococcus aureus (BCID) NOT DETECTED NOT DETECTED Final   Staphylococcus epidermidis NOT DETECTED NOT DETECTED Final   Staphylococcus lugdunensis NOT DETECTED NOT DETECTED Final   Streptococcus species NOT DETECTED NOT DETECTED Final   Streptococcus agalactiae NOT DETECTED NOT DETECTED Final    Streptococcus pneumoniae NOT DETECTED NOT DETECTED Final   Streptococcus pyogenes NOT DETECTED NOT DETECTED Final   A.calcoaceticus-baumannii NOT DETECTED NOT DETECTED Final   Bacteroides fragilis NOT DETECTED NOT DETECTED Final   Enterobacterales NOT DETECTED NOT DETECTED Final   Enterobacter cloacae complex NOT DETECTED NOT DETECTED Final   Escherichia coli NOT DETECTED NOT DETECTED Final   Klebsiella aerogenes NOT DETECTED NOT DETECTED Final   Klebsiella oxytoca NOT DETECTED NOT DETECTED Final   Klebsiella pneumoniae NOT DETECTED NOT DETECTED Final   Proteus species NOT DETECTED NOT DETECTED Final   Salmonella species NOT DETECTED NOT DETECTED Final   Serratia marcescens NOT DETECTED NOT DETECTED Final   Haemophilus influenzae NOT DETECTED NOT DETECTED Final   Neisseria meningitidis NOT DETECTED NOT DETECTED Final   Pseudomonas aeruginosa NOT DETECTED NOT DETECTED Final   Stenotrophomonas maltophilia NOT DETECTED NOT DETECTED Final   Candida albicans NOT DETECTED NOT DETECTED Final   Candida auris NOT DETECTED NOT DETECTED Final   Candida glabrata NOT DETECTED NOT DETECTED Final   Candida krusei NOT DETECTED NOT DETECTED Final   Candida parapsilosis NOT DETECTED NOT DETECTED Final   Candida tropicalis NOT DETECTED NOT DETECTED Final   Cryptococcus neoformans/gattii NOT DETECTED NOT DETECTED Final    Comment: Performed at [facility], [address], [place], [place]    RADIOLOGY STUDIES/RESULTS: No results found.   LOS: 9 days   [id number], MD  [facility]    To contact the attending provider between 7A-7P or the covering provider during after hours 7P-7A, please log into the web site [email or web address] and access using universal [place] password for that web site. If you do not have the password, please call the hospital operator.  08/10/2021, 12:28 PM

## 2021-08-11 DIAGNOSIS — L03114 Cellulitis of left upper limb: Secondary | ICD-10-CM | POA: Diagnosis not present

## 2021-08-11 DIAGNOSIS — E43 Unspecified severe protein-calorie malnutrition: Secondary | ICD-10-CM | POA: Insufficient documentation

## 2021-08-11 MED ORDER — FUROSEMIDE 40 MG PO TABS: 40.0000 mg | ORAL_TABLET | Freq: Every day | ORAL | Status: AC | PRN

## 2021-08-11 MED ORDER — MIDODRINE HCL 10 MG PO TABS: 5.0000 mg | ORAL_TABLET | Freq: Three times a day (TID) | ORAL | Status: DC

## 2021-08-11 MED ORDER — ENSURE ENLIVE PO LIQD: 237.0000 mL | Freq: Two times a day (BID) | ORAL | 12 refills | Status: AC

## 2021-08-11 MED ORDER — TRAZODONE HCL 50 MG PO TABS: 25.0000 mg | ORAL_TABLET | Freq: Every evening | ORAL | Status: DC | PRN

## 2021-08-11 MED ORDER — FUROSEMIDE 40 MG PO TABS
40.0000 mg | ORAL_TABLET | Freq: Every day | ORAL | Status: DC | PRN
  Filled 2021-08-11: qty 1

## 2021-08-11 NOTE — Progress Notes (Signed)
PT Cancellation Note  Patient Details Name: Johnny Dunlap MRN: [id number] DOB: August 21, 1950   Cancelled Treatment:    Reason Eval/Treat Not Completed: Patient declined, no reason specified Attempted to see pt for PT tx. Pt lying in bed & declines participation at this time. Will f/u as able & as pt is willing to participate.  Aleda Grana, PT, DPT 08/11/21, 2:25 PM    Sandi Mariscal 08/11/2021, 2:25 PM

## 2021-08-11 NOTE — Progress Notes (Signed)
PROGRESS NOTE        PATIENT DETAILS Name: Johnny Dunlap Age: 72 y.o. Sex: male Date of Birth: 28-Jul-1950 Admit Date: 08/01/2021 Admitting Physician Alford Highland, MD PCP:Pcp, No  Brief Narrative: Patient is a 71 y.o. male with history of liver cirrhosis, COPD, recent COVID 19 infection-presenting with left forearm cellulitis.  Significant events: 8/2>> admit for left forearm soft tissue infection  Significant studies: 8/2>> x-ray chest: No pneumonia-small right pleural effusion 8/2>> x-ray left elbow/left wrist: Diffuse soft tissue swelling 8/3>> x-ray left forearm: Diffuse soft tissue swelling  Antimicrobial therapy: Ancef: 8/3>>8/7 Keflex: 8/7>> 8/9  Microbiology data: 8/2>> blood culture 1/2: Staph hominis  Procedures : None  Consults: Orthopedics  DVT Prophylaxis : SCDs Start: 08/01/21 1807 Place TED hose Start: 08/01/21 1807   Subjective: Has been refusing diuretics-no other issues.  Assessment/Plan: Sepsis due to left forearm cellulitis: Sepsis physiology has improved-left forearm cellulitis has markedly improved-very minimal erythema present.  Has completed a course of antimicrobial therapy.  1/2 blood cultures positive for staph hominis which is probably a contamination and of no clinical significance.    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  We will change Lasix to as needed-stop Aldactone.  Try to counsel patient about he really does not want to continue diuretics as it makes him urinate too much.  Plan would be to continue counseling and reassess in the outpatient setting.  Recent COVID-19 infection: Currently asymptomatic-off isolation as of 8/4  Hypokalemia/hypomagnesemia: Repleted-follow  periodically.  Homelessness: will need CM/SW involvement prior to discharge.  Nutrition Status: Nutrition Problem: Severe Malnutrition Etiology: social / environmental circumstances Signs/Symptoms: severe fat depletion, severe muscle depletion Interventions: Ensure Enlive (each supplement provides 350kcal and 20 grams of protein), MVI, Magic cup     Diet: Diet Order             Diet - low sodium heart healthy           Diet regular Room service appropriate? Yes; Fluid consistency: Thin  Diet effective now                    Code Status: Full code   Family Communication: None at bedside  Disposition Plan: Status is: Inpatient  Remains inpatient appropriate because:Inpatient level of care appropriate due to severity of illness  Dispo: The patient is from: Home              Anticipated d/c is to: Home              Patient currently is medically stable to d/c.   Difficult to place patient No   Barriers to Discharge: Awaiting SNF placement  Antimicrobial agents: Anti-infectives (From admission, onward)    Start     Dose/Rate Route Frequency Ordered Stop   08/06/21 1415  cephALEXin (KEFLEX) capsule 500 mg        500 mg Oral Every 8 hours 08/06/21 1324 08/09/21 0506   08/02/21 1600  ceFAZolin (ANCEF) IVPB 2g/100 mL premix  Status:  Discontinued        2 g 200 mL/hr over 30 Minutes Intravenous Every 8 hours 08/01/21 1820 08/06/21 1324   08/01/21 2200  ceFAZolin (ANCEF) IVPB 2g/100 mL premix  Status:  Discontinued  2 g 200 mL/hr over 30 Minutes Intravenous Every 8 hours 08/01/21 1804 08/01/21 1820   08/01/21 1500  vancomycin (VANCOCIN) IVPB 1000 mg/200 mL premix        1,000 mg 200 mL/hr over 60 Minutes Intravenous  Once 08/01/21 1449 08/01/21 1730   08/01/21 1500  cefTRIAXone (ROCEPHIN) 2 g in sodium chloride 0.9 % 100 mL IVPB        2 g 200 mL/hr over 30 Minutes Intravenous  Once 08/01/21 1449 08/01/21 1613        Time spent: 15 minutes-Greater  than 50% of this time was spent in counseling, explanation of diagnosis, planning of further management, and coordination of care.  MEDICATIONS: Scheduled Meds:  feeding supplement  237 mL Oral BID BM   furosemide  40 mg Oral Daily   midodrine  10 mg Oral TID WC   multivitamin with minerals  1 tablet Oral Daily   spironolactone  100 mg Oral Daily   Continuous Infusions:   PRN Meds:.acetaminophen **OR** acetaminophen, albuterol, ondansetron **OR** ondansetron (ZOFRAN) IV, oxyCODONE   PHYSICAL EXAM: Vital signs: Vitals:   08/10/21 1618 08/10/21 1947 08/11/21 0431 08/11/21 0756  BP: (!) 99/56 128/64 (!) 105/44 (!) 90/52  Pulse: 67 66 65 66  Resp: 19 20 18 18   Temp: 97.6 F (36.4 C) 98.8 F (37.1 C) 98 F (36.7 C) 98.1 F (36.7 C)  TempSrc: Oral Oral    SpO2: 97% 98% 99% 97%  Weight:      Height:       Filed Weights   08/01/21 1613 08/10/21 1517  Weight: 67.1 kg 68.4 kg   Body mass index is 20.45 kg/m.   Gen Exam:Alert awake-not in any distress HEENT:atraumatic, normocephalic Chest: B/L clear to auscultation anteriorly CVS:S1S2 regular Abdomen:soft non tender, non distended Extremities:+ edema Neurology: Non focal Skin: no rash   I have personally reviewed following labs and imaging studies  LABORATORY DATA: CBC: No results for input(s): WBC, NEUTROABS, HGB, HCT, MCV, PLT in the last 168 hours.   Basic Metabolic Panel: Recent Labs  Lab 08/05/21 0553 08/06/21 0500 08/07/21 0430 08/08/21 0748 08/09/21 0453  NA 134* 136 137 137 137  K 3.5 3.5 4.0 3.8 4.2  CL 107 108 109 107 107  CO2 22 24 25 26 24   GLUCOSE 115* 84 85 95 80  BUN 12 11 19 19 17   CREATININE 0.80 0.70 0.83 0.92 0.85  CALCIUM 7.4* 7.3* 7.8* 7.8* 8.0*  MG 1.8 1.7 1.7  --   --      GFR: Estimated Creatinine Clearance: 80.5 mL/min (by C-G formula based on SCr of 0.85 mg/dL).  Liver Function Tests: No results for input(s): AST, ALT, ALKPHOS, BILITOT, PROT, ALBUMIN in the last 168  hours.  No results for input(s): LIPASE, AMYLASE in the last 168 hours. No results for input(s): AMMONIA in the last 168 hours.  Coagulation Profile: No results for input(s): INR, PROTIME in the last 168 hours.   Cardiac Enzymes: No results for input(s): CKTOTAL, CKMB, CKMBINDEX, TROPONINI in the last 168 hours.   BNP (last 3 results) No results for input(s): PROBNP in the last 8760 hours.  Lipid Profile: No results for input(s): CHOL, HDL, LDLCALC, TRIG, CHOLHDL, LDLDIRECT in the last 72 hours.  Thyroid Function Tests: No results for input(s): TSH, T4TOTAL, FREET4, T3FREE, THYROIDAB in the last 72 hours.  Anemia Panel: No results for input(s): VITAMINB12, FOLATE, FERRITIN, TIBC, IRON, RETICCTPCT in the last 72 hours.  Urine analysis: No  results found for: COLORURINE, APPEARANCEUR, LABSPEC, PHURINE, GLUCOSEU, HGBUR, BILIRUBINUR, KETONESUR, PROTEINUR, UROBILINOGEN, NITRITE, LEUKOCYTESUR  Sepsis Labs: Lactic Acid, Venous    Component Value Date/Time   LATICACIDVEN 1.8 08/02/2021 0506    MICROBIOLOGY: Recent Results (from the past 240 hour(s))  Resp Panel by RT-PCR (Flu A&B, Covid) Nasopharyngeal Swab     Status: Abnormal   Collection Time: 08/01/21  3:03 PM   Specimen: Nasopharyngeal Swab; Nasopharyngeal(NP) swabs in vial transport medium  Result Value Ref Range Status   SARS Coronavirus 2 by RT PCR POSITIVE (A) NEGATIVE Final    Comment: RESULT CALLED TO, READ BACK BY AND VERIFIED WITH: KARA BRIDGES 08/01/21 1624 KLW (NOTE) SARS-CoV-2 target nucleic acids are DETECTED.  The SARS-CoV-2 RNA is generally detectable in upper respiratory specimens during the acute phase of infection. Positive results are indicative of the presence of the identified virus, but do not rule out bacterial infection or co-infection with other pathogens not detected by the test. Clinical correlation with patient history and other diagnostic information is necessary to determine  patient infection status. The expected result is Negative.  Fact Sheet for Patients: BloggerCourse.com  Fact Sheet for Healthcare Providers: SeriousBroker.it  This test is not yet approved or cleared by the [place] FDA and  has been authorized for detection and/or diagnosis of SARS-CoV-2 by FDA under an Emergency Use Authorization (EUA).  This EUA will remain in effect (meaning this test can be used ) for the duration of  the COVID-19 declaration under Section 564(b)(1) of the Act, 21 U.S.C. section 360bbb-3(b)(1), unless the authorization is terminated or revoked sooner.     Influenza A by PCR NEGATIVE NEGATIVE Final   Influenza B by PCR NEGATIVE NEGATIVE Final    Comment: (NOTE) The Xpert Xpress SARS-CoV-2/FLU/RSV plus assay is intended as an aid in the diagnosis of influenza from Nasopharyngeal swab specimens and should not be used as a sole basis for treatment. Nasal washings and aspirates are unacceptable for Xpert Xpress SARS-CoV-2/FLU/RSV testing.  Fact Sheet for Patients: BloggerCourse.com  Fact Sheet for Healthcare Providers: SeriousBroker.it  This test is not yet approved or cleared by the [place] FDA and has been authorized for detection and/or diagnosis of SARS-CoV-2 by FDA under an Emergency Use Authorization (EUA). This EUA will remain in effect (meaning this test can be used) for the duration of the COVID-19 declaration under Section 564(b)(1) of the Act, 21 U.S.C. section 360bbb-3(b)(1), unless the authorization is terminated or revoked.  Performed at [facility], [address], [place], [place] [address]   Blood Culture (routine x 2)     Status: Abnormal   Collection Time: 08/01/21  3:03 PM   Specimen: BLOOD  Result Value Ref Range Status   Specimen Description   Final    BLOOD BLOOD RIGHT FOREARM Performed at [facility], [address] [place], [place] [address]    Special Requests   Final    BOTTLES DRAWN AEROBIC AND ANAEROBIC Blood Culture adequate volume Performed at [facility], [address] [place], [place] [address]    Culture  Setup Time   Final    GRAM POSITIVE COCCI ANAEROBIC BOTTLE ONLY CRITICAL RESULT CALLED TO, READ BACK BY AND VERIFIED WITH: DEVIN MITCHELL @0835  08/02/21 SCS    Culture (A)  Final    STAPHYLOCOCCUS HOMINIS THE SIGNIFICANCE OF ISOLATING THIS ORGANISM FROM A SINGLE SET OF BLOOD CULTURES WHEN MULTIPLE SETS ARE DRAWN IS UNCERTAIN. PLEASE NOTIFY THE MICROBIOLOGY DEPARTMENT WITHIN ONE WEEK  IF SPECIATION AND SENSITIVITIES ARE REQUIRED. Performed at [facility], [address] [place], [place] [address]    Report Status 08/04/2021 FINAL  Final  Blood Culture (routine x 2)     Status: None   Collection Time: 08/01/21  3:03 PM   Specimen: BLOOD  Result Value Ref Range Status   Specimen Description BLOOD RIGHT ANTECUBITAL  Final   Special Requests   Final    BOTTLES DRAWN AEROBIC AND ANAEROBIC Blood Culture adequate volume   Culture   Final    NO GROWTH 5 DAYS Performed at [facility], [address] [place], [place] [address]    Report Status 08/06/2021 FINAL  Final  Blood Culture ID Panel (Reflexed)     Status: Abnormal   Collection Time: 08/01/21  3:03 PM  Result Value Ref Range Status   Enterococcus faecalis NOT DETECTED NOT DETECTED Final   Enterococcus Faecium NOT DETECTED NOT DETECTED Final   Listeria monocytogenes NOT DETECTED NOT DETECTED Final   Staphylococcus species DETECTED (A) NOT DETECTED Final    Comment: CRITICAL RESULT CALLED TO, READ BACK BY AND VERIFIED WITH: DEVIN MITCHELL @0835  08/02/21 SCS    Staphylococcus aureus (BCID) NOT DETECTED NOT DETECTED Final   Staphylococcus epidermidis NOT DETECTED NOT DETECTED Final   Staphylococcus lugdunensis NOT DETECTED NOT DETECTED Final   Streptococcus species NOT DETECTED NOT  DETECTED Final   Streptococcus agalactiae NOT DETECTED NOT DETECTED Final   Streptococcus pneumoniae NOT DETECTED NOT DETECTED Final   Streptococcus pyogenes NOT DETECTED NOT DETECTED Final   A.calcoaceticus-baumannii NOT DETECTED NOT DETECTED Final   Bacteroides fragilis NOT DETECTED NOT DETECTED Final   Enterobacterales NOT DETECTED NOT DETECTED Final   Enterobacter cloacae complex NOT DETECTED NOT DETECTED Final   Escherichia coli NOT DETECTED NOT DETECTED Final   Klebsiella aerogenes NOT DETECTED NOT DETECTED Final   Klebsiella oxytoca NOT DETECTED NOT DETECTED Final   Klebsiella pneumoniae NOT DETECTED NOT DETECTED Final   Proteus species NOT DETECTED NOT DETECTED Final   Salmonella species NOT DETECTED NOT DETECTED Final   Serratia marcescens NOT DETECTED NOT DETECTED Final   Haemophilus influenzae NOT DETECTED NOT DETECTED Final   Neisseria meningitidis NOT DETECTED NOT DETECTED Final   Pseudomonas aeruginosa NOT DETECTED NOT DETECTED Final   Stenotrophomonas maltophilia NOT DETECTED NOT DETECTED Final   Candida albicans NOT DETECTED NOT DETECTED Final   Candida auris NOT DETECTED NOT DETECTED Final   Candida glabrata NOT DETECTED NOT DETECTED Final   Candida krusei NOT DETECTED NOT DETECTED Final   Candida parapsilosis NOT DETECTED NOT DETECTED Final   Candida tropicalis NOT DETECTED NOT DETECTED Final   Cryptococcus neoformans/gattii NOT DETECTED NOT DETECTED Final    Comment: Performed at [facility], [address] [place], [place] [address]    RADIOLOGY STUDIES/RESULTS: No results found.   LOS: 10 days   Jeoffrey MassedShanker Tangie Stay, MD  [facility]    To contact the attending provider between 7A-7P or the covering provider during after hours 7P-7A, please log into the web site [email or web address] and access using universal [place] password for that web site. If you do not have the password, please call the hospital operator.  08/11/2021, 9:41 AM

## 2021-08-11 NOTE — Progress Notes (Signed)
OT Cancellation Note  Patient Details Name: Johnny Dunlap MRN: [id number] DOB: 1950/03/08   Cancelled Treatment:    Reason Eval/Treat Not Completed: Patient declined, no reason specified. Pt sleeping upon OT's arrival, wakes to voice. Pt endorses not sleeping well and very tired, requesting to just rest. Pt politely declined OT tx despite encouragement. Will re-attempt at later date/time as appropriate.   Wynona Canes, MPH, MS, OTR/L ascom [phone] 08/11/21, 10:35 AM

## 2021-08-11 NOTE — TOC Progression Note (Signed)
Transition of Care [facility]) - Progression Note    Patient Details  Name: Johnny Dunlap MRN: [id number] Date of Birth: March 08, 1950  [facility]) CM/SW Contact  Allayne Butcher, RN Phone Number: 08/11/2021, 1:29 PM  Clinical Narrative:    Insurance authorization is pending.  Delorise Shiner at [facility] assures this RNCM that once authorization comes back she will call.    Expected Discharge Plan: Skilled Nursing Facility Barriers to Discharge: Continued Medical Work up  Expected Discharge Plan and Services Expected Discharge Plan: Skilled Nursing Facility   Discharge Planning Services: CM Consult Post Acute Care Choice: Skilled Nursing Facility Living arrangements for the past 2 months: Homeless Expected Discharge Date: 08/08/21               DME Arranged: N/A         HH Arranged: NA           Social Determinants of Health (SDOH) Interventions    Readmission Risk Interventions No flowsheet data found.

## 2021-08-11 NOTE — Progress Notes (Signed)
Patient refused morning medications stating "today is my last day so those pills aren't going to help me one bbit".  I explained to him that his current medications will be the medications that he will be discharged taking but patient still refused.  MD notified.

## 2021-08-12 DIAGNOSIS — L03114 Cellulitis of left upper limb: Secondary | ICD-10-CM | POA: Diagnosis not present

## 2021-08-12 NOTE — TOC Progression Note (Signed)
Transition of Care [facility]) - Progression Note    Patient Details  Name: Johnny Dunlap MRN: [id number] Date of Birth: 18-Aug-1950  [facility] of [facility]) CM/SW Contact  Bing Quarry, RN Phone Number: 08/12/2021, 3:28 PM  Clinical Narrative: No word yet from pending insurance authorization via [employer]. Gabriel Cirri RN CM 330 pm 08/12/21.     Expected Discharge Plan: Skilled Nursing Facility Barriers to Discharge: Continued Medical Work up  Expected Discharge Plan and Services Expected Discharge Plan: Skilled Nursing Facility   Discharge Planning Services: CM Consult Post Acute Care Choice: Skilled Nursing Facility Living arrangements for the past 2 months: Homeless Expected Discharge Date: 08/08/21               DME Arranged: N/A         HH Arranged: NA           Social Determinants of Health (SDOH) Interventions    Readmission Risk Interventions No flowsheet data found.

## 2021-08-12 NOTE — Progress Notes (Addendum)
Physical Therapy Treatment Patient Details Name: Johnny Dunlap MRN: [id number] DOB: 1950-11-08 Today's Date: 08/12/2021    History of Present Illness 71 y.o. M here with GI bleed a few weeks ago, now with progessive pain and swelling affecting his left arm, admitted with sepsis/cellulitis.  He states that it is very painful for him to move the arm and that it has looked very red recently.  He has not sure if he has had a fever and he denies any recent trauma to the arm.  Pt brought in by EMS who apparently found him outside of homeless shelter covered in stool and dirt. PMH of COPD, cirrhosis.    PT Comments    Pt was supine in bed, naked, but alert and oriented however lacks insight of current situation. He is pleasant and completely able to follow commands throughout. Was able to exit flat bed with supervision only. Did not use bed rails. Sat EOB x several minutes prior to standing and ambulating 120 ft with RW. Did require a few vcs for posture correction but overall no LOB during gait training. Distance only limited by pt's breakfast arriving. HR and O2 stable. Returned to room and pt was repositioned in recliner with call bell in reach and chair alarm in place. PT will sign off. Pt has hit all goals. Highly recommend 24/7 supervision for safety at DC.    Follow Up Recommendations  Supervision/Assistance - 24 hour;Other (comment);Supervision for mobility/OOB (pt is homeless and will require 24/7 supervision at DC)     Equipment Recommendations  Rolling walker with 5" wheels       Precautions / Restrictions Precautions Precautions: Fall Restrictions Weight Bearing Restrictions: No    Mobility  Bed Mobility Overal bed mobility: Needs Assistance Bed Mobility: Supine to Sit     Supine to sit: Supervision     General bed mobility comments: increased time/effort, supervision for safety    Transfers Overall transfer level: Needs assistance Equipment used: Rolling walker (2  wheeled) Transfers: Sit to/from Stand Sit to Stand: Supervision         General transfer comment: CGA for safety. no lifting assistance or cues required  Ambulation/Gait Ambulation/Gait assistance: Supervision Gait Distance (Feet): 120 Feet Assistive device: Rolling walker (2 wheeled) Gait Pattern/deviations: Step-through pattern;Trunk flexed;Narrow base of support Gait velocity: decreased   General Gait Details: pt was easily able to ambulate 120 ft with RW. no LOB however requires Vcs for posture correction and staying inside RW. pt requested rollator for future use   Balance Overall balance assessment: Needs assistance Sitting-balance support: No upper extremity supported;Feet supported Sitting balance-Leahy Scale: Good Sitting balance - Comments: no balance deficits noted while sitting EOB.   Standing balance support: Bilateral upper extremity supported;During functional activity Standing balance-Leahy Scale: Good Standing balance comment: no LOB noted      Cognition Arousal/Alertness: Awake/alert Behavior During Therapy: WFL for tasks assessed/performed Overall Cognitive Status: No family/caregiver present to determine baseline cognitive functioning (has poor safety awareness and insight of situation)        General Comments: Pt is alert and able to follow commands consistently throughout. poor insight of situation.             Pertinent Vitals/Pain Pain Assessment: No/denies pain Pain Score: 0-No pain     PT Goals (current goals can now be found in the care plan section) Acute Rehab PT Goals Patient Stated Goal: I need a 4 wheel walker with a seat Progress towards PT goals: Goals met and  updated - see care plan;Goals met/education completed, patient discharged from PT    Frequency     (DC from PT.)      PT Plan Frequency needs to be updated (DC from Acute PT. All goals completed)       AM-PAC PT "6 Clicks" Mobility   Outcome Measure  Help needed  turning from your back to your side while in a flat bed without using bedrails?: A Little Help needed moving from lying on your back to sitting on the side of a flat bed without using bedrails?: A Little Help needed moving to and from a bed to a chair (including a wheelchair)?: A Little Help needed standing up from a chair using your arms (e.g., wheelchair or bedside chair)?: A Little Help needed to walk in hospital room?: A Little Help needed climbing 3-5 steps with a railing? : A Little 6 Click Score: 18    End of Session Equipment Utilized During Treatment: Gait belt Activity Tolerance: Patient tolerated treatment well Patient left: in chair;with call bell/phone within reach;with chair alarm set Nurse Communication: Mobility status PT Visit Diagnosis: Other abnormalities of gait and mobility (R26.89);Muscle weakness (generalized) (M62.81)     Time: 3893-7342 PT Time Calculation (min) (ACUTE ONLY): 23 min  Charges:  $Gait Training: 8-22 mins $Therapeutic Activity: 8-22 mins                     Julaine Fusi PTA 08/12/21, 8:24 AM

## 2021-08-12 NOTE — Progress Notes (Signed)
Occupational Therapy Treatment/discharge Patient Details Name: Merrik Puebla MRN: [id number] DOB: [birth year] Today's Date: 08/12/2021    History of present illness 71 y.o. M here with GI bleed a few weeks ago, now with progessive pain and swelling affecting his left arm, admitted with sepsis/cellulitis.  He states that it is very painful for him to move the arm and that it has looked very red recently.  He has not sure if he has had a fever and he denies any recent trauma to the arm.  Pt brought in by EMS who apparently found him outside of homeless shelter covered in stool and dirt. PMH of COPD, cirrhosis.   OT comments  Pt was supine in bed, willing to work with OT - corrected OT that lunch usually is closer to 1 pm than 12 - Orientated and  alert -following directions with no issues. Supine to sit and ambulate with walker to sink - all supervision and no LOB - able to donn and doff socks. L UE AROM and strength WNL -and FMC in L hand WNL to perform ADL's.  No need for OT at this time- do recommend 24 Supervision at discharge    Follow Up Recommendations     Discharge at this time from OT - with 24 supervision and RW use per PT  Equipment Recommendations       Recommendations for Other Services      Precautions / Restrictions         Mobility Bed Mobility - Independent to EOB                    Transfers - Supervision -                       Balance                                           ADL either performed or assessed with clinical judgement   ADL                                               Vision       Perception     Praxis      Cognition Arousal/Alertness: Awake/alert Behavior During Therapy: WFL for tasks assessed/performed Overall Cognitive Status: No family/caregiver present to determine baseline cognitive functioning                                 General Comments: Pt is alert and  able to follow commands consistently throughout session        Exercises Other Exercises Other Exercises: BUE AROM WNL and strength 5/5 - grip strength  even with L -and FMC and opposition WNL - able to feed self, open jar, apply deodorant, wash hands and donn/doff socks - repot only has trouble opening mil carton - but had that for years Other Exercises: Ambulate with walker to bathoom - Supervision  - no LOB   Shoulder Instructions       General Comments      Pertinent Vitals/ Pain       Pain Assessment: No/denies pain  Home Living  Prior Functioning/Environment              Frequency           Progress Toward Goals  OT Goals(current goals can now be found in the care plan section)     Acute Rehab OT Goals Patient Stated Goal: need walker at this time OT Goal Formulation: With patient Time For Goal Achievement: 08/12/21  Plan      Co-evaluation                 AM-PAC OT "6 Clicks" Daily Activity     Outcome Measure                    End of Session Equipment Utilized During Treatment: Gait belt      Activity Tolerance Patient tolerated treatment well   Patient Left in bed;with call bell/phone within reach;with bed alarm set   Nurse Communication          Time: 1478-2956 OT Time Calculation (min): 17 min  Charges: OT General Charges $OT Visit: 1 Visit OT Treatments $Self Care/Home Management : 8-22 mins   Madlynn Lundeen OTR/L,CLT 08/12/2021, 12:58 PM

## 2021-08-12 NOTE — Progress Notes (Signed)
PROGRESS NOTE        PATIENT DETAILS Name: Johnny Dunlap Age: 71 y.o. Sex: male Date of Birth: [birth year] Admit Date: 08/01/2021 Admitting Physician Alford Highland, MD PCP:Pcp, No  Brief Narrative: Patient is a 71 y.o. male with history of liver cirrhosis, COPD, recent COVID 19 infection-presenting with left forearm cellulitis.  Significant events: 8/2>> admit for left forearm soft tissue infection  Significant studies: 8/2>> x-ray chest: No pneumonia-small right pleural effusion 8/2>> x-ray left elbow/left wrist: Diffuse soft tissue swelling 8/3>> x-ray left forearm: Diffuse soft tissue swelling  Antimicrobial therapy: Ancef: 8/3>>8/7 Keflex: 8/7>> 8/9  Microbiology data: 8/2>> blood culture 1/2: Staph hominis  Procedures : None  Consults: Orthopedics  DVT Prophylaxis : SCDs Start: 08/01/21 1807 Place TED hose Start: 08/01/21 1807   Subjective:  Patient in bed, appears comfortable, denies any headache, no fever, no chest pain or pressure, no shortness of breath , no abdominal pain. No new focal weakness.   Assessment/Plan: Sepsis due to left forearm cellulitis: Sepsis physiology has improved-left forearm cellulitis has markedly improved-very minimal erythema present.  Has completed a course of antimicrobial therapy.  1/2 blood cultures positive for staph hominis which is probably a contamination and of no clinical significance.    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  We will change Lasix to as needed-stop Aldactone.  Try to counsel patient about he really does not want to continue diuretics as it makes him urinate too much.  Plan would be to continue counseling and reassess in the outpatient setting.  Recent COVID-19 infection:  Currently asymptomatic-off isolation as of 8/4  Hypokalemia/hypomagnesemia: Repleted-follow periodically.  Homelessness: will need CM/SW involvement prior to discharge.  Nutrition Status: Nutrition Problem: Severe Malnutrition Etiology: social / environmental circumstances Signs/Symptoms: severe fat depletion, severe muscle depletion Interventions: Ensure Enlive (each supplement provides 350kcal and 20 grams of protein), MVI, Magic cup     Diet: Diet Order             Diet - low sodium heart healthy           Diet regular Room service appropriate? Yes; Fluid consistency: Thin  Diet effective now                    Code Status: Full code   Family Communication: None at bedside  Disposition Plan: Status is: Inpatient  Remains inpatient appropriate because:Inpatient level of care appropriate due to severity of illness  Dispo: The patient is from: Home              Anticipated d/c is to: SNF              Patient currently is medically stable to d/c.   Difficult to place patient No   Barriers to Discharge: Awaiting SNF placement  Antimicrobial agents: Anti-infectives (From admission, onward)    Start     Dose/Rate Route Frequency Ordered Stop   08/06/21 1415  cephALEXin (KEFLEX) capsule 500 mg        500 mg Oral Every 8 hours 08/06/21 1324 08/09/21 0506   08/02/21 1600  ceFAZolin (ANCEF) IVPB 2g/100 mL premix  Status:  Discontinued        2 g 200 mL/hr over 30 Minutes  Intravenous Every 8 hours 08/01/21 1820 08/06/21 1324   08/01/21 2200  ceFAZolin (ANCEF) IVPB 2g/100 mL premix  Status:  Discontinued        2 g 200 mL/hr over 30 Minutes Intravenous Every 8 hours 08/01/21 1804 08/01/21 1820   08/01/21 1500  vancomycin (VANCOCIN) IVPB 1000 mg/200 mL premix        1,000 mg 200 mL/hr over 60 Minutes Intravenous  Once 08/01/21 1449 08/01/21 1730   08/01/21 1500  cefTRIAXone (ROCEPHIN) 2 g in sodium chloride 0.9 % 100 mL IVPB        2 g 200 mL/hr over 30 Minutes  Intravenous  Once 08/01/21 1449 08/01/21 1613        Time spent: 15 minutes-Greater than 50% of this time was spent in counseling, explanation of diagnosis, planning of further management, and coordination of care.  MEDICATIONS: Scheduled Meds:  feeding supplement  237 mL Oral BID BM   midodrine  10 mg Oral TID WC   multivitamin with minerals  1 tablet Oral Daily   Continuous Infusions:   PRN Meds:.acetaminophen **OR** acetaminophen, albuterol, furosemide, ondansetron **OR** ondansetron (ZOFRAN) IV, oxyCODONE, traZODone   PHYSICAL EXAM: Vital signs: Vitals:   08/11/21 2029 08/12/21 0046 08/12/21 0547 08/12/21 0818  BP: (!) 110/52 (!) 115/44 98/62 99/60   Pulse: 67 75 68 72  Resp: 16 16 18 18   Temp: 98.5 F (36.9 C) 98.4 F (36.9 C) 98.4 F (36.9 C) 98 F (36.7 C)  TempSrc: Oral     SpO2: 97% 97% 98% 98%  Weight:      Height:       Filed Weights   08/01/21 1613 08/10/21 1517  Weight: 67.1 kg 68.4 kg   Body mass index is 20.45 kg/m.    Exam:  Awake Alert, No new F.N deficits, Normal affect [place].AT,PERRAL Supple Neck,No JVD, No cervical lymphadenopathy appriciated.  Symmetrical Chest wall movement, Good air movement bilaterally, CTAB RRR,No Gallops, Rubs or new Murmurs, No Parasternal Heave +ve B.Sounds, Abd Soft, No tenderness, No organomegaly appriciated, No rebound - guarding or rigidity. No Cyanosis, Clubbing or edema, No new Rash or bruise    I have personally reviewed following labs and imaging studies  LABORATORY DATA: CBC: No results for input(s): WBC, NEUTROABS, HGB, HCT, MCV, PLT in the last 168 hours.   Basic Metabolic Panel: Recent Labs  Lab 08/06/21 0500 08/07/21 0430 08/08/21 0748 08/09/21 0453  NA 136 137 137 137  K 3.5 4.0 3.8 4.2  CL 108 109 107 107  CO2 24 25 26 24   GLUCOSE 84 85 95 80  BUN 11 19 19 17   CREATININE 0.70 0.83 0.92 0.85  CALCIUM 7.3* 7.8* 7.8* 8.0*  MG 1.7 1.7  --   --     GFR: Estimated Creatinine Clearance:  80.5 mL/min (by C-G formula based on SCr of 0.85 mg/dL).  Liver Function Tests: No results for input(s): AST, ALT, ALKPHOS, BILITOT, PROT, ALBUMIN in the last 168 hours.  No results for input(s): LIPASE, AMYLASE in the last 168 hours. No results for input(s): AMMONIA in the last 168 hours.  Coagulation Profile: No results for input(s): INR, PROTIME in the last 168 hours.   Cardiac Enzymes: No results for input(s): CKTOTAL, CKMB, CKMBINDEX, TROPONINI in the last 168 hours.   BNP (last 3 results) No results for input(s): PROBNP in the last 8760 hours.  Lipid Profile: No results for input(s): CHOL, HDL, LDLCALC, TRIG, CHOLHDL, LDLDIRECT in the last 72 hours.  Thyroid  Function Tests: No results for input(s): TSH, T4TOTAL, FREET4, T3FREE, THYROIDAB in the last 72 hours.  Anemia Panel: No results for input(s): VITAMINB12, FOLATE, FERRITIN, TIBC, IRON, RETICCTPCT in the last 72 hours.  Urine analysis: No results found for: COLORURINE, APPEARANCEUR, LABSPEC, PHURINE, GLUCOSEU, HGBUR, BILIRUBINUR, KETONESUR, PROTEINUR, UROBILINOGEN, NITRITE, LEUKOCYTESUR  Sepsis Labs: Lactic Acid, Venous    Component Value Date/Time   LATICACIDVEN 1.8 08/02/2021 0506    MICROBIOLOGY: No results found for this or any previous visit (from the past 240 hour(s)).   RADIOLOGY STUDIES/RESULTS: No results found.   LOS: 11 days   Signature  Susa Raring M.D on 08/12/2021 at 10:30 AM   -  To page go to [email or web address]

## 2021-08-13 DIAGNOSIS — L03114 Cellulitis of left upper limb: Secondary | ICD-10-CM | POA: Diagnosis not present

## 2021-08-13 LAB — BASIC METABOLIC PANEL
Anion gap: 4 — ABNORMAL LOW (ref 5–15)
BUN: 17 mg/dL (ref 8–23)
CO2: 23 mmol/L (ref 22–32)
Calcium: 8 mg/dL — ABNORMAL LOW (ref 8.9–10.3)
Chloride: 112 mmol/L — ABNORMAL HIGH (ref 98–111)
Creatinine, Ser: 0.71 mg/dL (ref 0.61–1.24)
GFR, Estimated: >60 mL/min (ref >60)
Glucose, Bld: 87 mg/dL (ref 70–99)
Potassium: 3.8 mmol/L (ref 3.5–5.1)
Sodium: 139 mmol/L (ref 135–145)

## 2021-08-13 LAB — CBC
HCT: 25.4 % — ABNORMAL LOW (ref 39.0–52.0)
Hemoglobin: 8.2 g/dL — ABNORMAL LOW (ref 13.0–17.0)
MCH: 29.7 pg (ref 26.0–34.0)
MCHC: 32.3 g/dL (ref 30.0–36.0)
MCV: 92 fL (ref 80.0–100.0)
Platelets: 66 10*3/uL — ABNORMAL LOW (ref 150–400)
RBC: 2.76 MIL/uL — ABNORMAL LOW (ref 4.22–5.81)
RDW: 25.2 % — ABNORMAL HIGH (ref 11.5–15.5)
WBC: 4.2 10*3/uL (ref 4.0–10.5)
nRBC: 0 % (ref 0.0–0.2)

## 2021-08-13 LAB — MAGNESIUM: Magnesium: 1.6 mg/dL — ABNORMAL LOW (ref 1.7–2.4)

## 2021-08-13 LAB — URIC ACID: Uric Acid, Serum: 4.1 mg/dL (ref 3.7–8.6)

## 2021-08-13 MED ORDER — DICLOFENAC SODIUM 1 % EX GEL
2.0000 g | Freq: Four times a day (QID) | CUTANEOUS | Status: DC
  Administered 2021-08-13 – 2021-08-15 (×8): 2 g via TOPICAL
  Filled 2021-08-13: qty 100

## 2021-08-13 MED ORDER — MAGNESIUM SULFATE 2 GM/50ML IV SOLN
2.0000 g | Freq: Once | INTRAVENOUS | Status: AC
  Administered 2021-08-13: 11:00:00 2 g via INTRAVENOUS
  Filled 2021-08-13: qty 50

## 2021-08-13 MED ORDER — GABAPENTIN 600 MG PO TABS
300.0000 mg | ORAL_TABLET | Freq: Two times a day (BID) | ORAL | Status: DC
  Administered 2021-08-13 – 2021-08-15 (×5): 300 mg via ORAL
  Filled 2021-08-13 (×5): qty 1

## 2021-08-13 NOTE — Progress Notes (Signed)
PROGRESS NOTE        PATIENT DETAILS Name: Johnny Dunlap Age: 71 y.o. Sex: male Date of Birth: November 09, 1950 Admit Date: 08/01/2021 Admitting Physician Alford Highland, MD PCP:Pcp, No  Brief Narrative: Patient is a 71 y.o. male with history of liver cirrhosis, COPD, recent COVID 19 infection-presenting with left forearm cellulitis.  Significant events: 8/2>> admit for left forearm soft tissue infection  Significant studies: 8/2>> x-ray chest: No pneumonia-small right pleural effusion 8/2>> x-ray left elbow/left wrist: Diffuse soft tissue swelling 8/3>> x-ray left forearm: Diffuse soft tissue swelling  Antimicrobial therapy: Ancef: 8/3>>8/7 Keflex: 8/7>> 8/9  Microbiology data: 8/2>> blood culture 1/2: Staph hominis  Procedures : None  Consults: Orthopedics  DVT Prophylaxis : SCDs Start: 08/01/21 1807 Place TED hose Start: 08/01/21 1807   Subjective:  Patient in bed, appears comfortable, denies any headache, no fever, no chest pain or pressure, no shortness of breath , no abdominal pain. No new focal weakness.    Assessment/Plan:  Sepsis due to left forearm cellulitis: Sepsis physiology has improved-left forearm cellulitis has markedly improved-very minimal erythema present.  Has completed a course of antimicrobial therapy.  1/2 blood cultures positive for staph hominis which is probably a contamination and of no clinical significance.    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  We will change Lasix to as needed-stop Aldactone.  Try to counsel patient about he really does not want to continue diuretics as it makes him urinate too much.  Plan would be to continue counseling and reassess in the outpatient setting.  Recent COVID-19  infection: Currently asymptomatic-off isolation as of 08/03/21  Hypomagnesemia: Placed IV on 08/13/2021 we will continue to monitor as needed.  Homelessness: will need CM/SW involvement prior to discharge.  Nutrition Status: Nutrition Problem: Severe Malnutrition Etiology: social / environmental circumstances Signs/Symptoms: severe fat depletion, severe muscle depletion Interventions: Ensure Enlive (each supplement provides 350kcal and 20 grams of protein), MVI, Magic cup     Diet: Diet Order             Diet - low sodium heart healthy           Diet regular Room service appropriate? Yes; Fluid consistency: Thin  Diet effective now                    Code Status: Full code   Family Communication: None at bedside  Disposition Plan: Status is: Inpatient  Remains inpatient appropriate because:Inpatient level of care appropriate due to severity of illness  Dispo: The patient is from: Home              Anticipated d/c is to: SNF              Patient currently is medically stable to d/c.   Difficult to place patient No   Barriers to Discharge:  Awaiting SNF placement  Time spent: 15 minutes-Greater than 50% of this time was spent in counseling, explanation of diagnosis, planning of further management, and coordination of care.  MEDICATIONS: Scheduled Meds:  feeding supplement  237 mL Oral BID BM   midodrine  10 mg Oral TID WC   multivitamin with minerals  1 tablet Oral Daily   Continuous Infusions:  magnesium sulfate  bolus IVPB      PRN Meds:.acetaminophen **OR** acetaminophen, albuterol, furosemide, ondansetron **OR** ondansetron (ZOFRAN) IV, oxyCODONE, traZODone   PHYSICAL EXAM: Vital signs: Vitals:   08/12/21 1551 08/12/21 2023 08/13/21 0454 08/13/21 0818  BP: (!) 91/57 (!) 96/56 (!) 96/58 (!) 92/51  Pulse: 62 68 64 73  Resp: 18 16 16 18   Temp: 98.1 F (36.7 C) 98.2 F (36.8 C) 98 F (36.7 C) 98 F (36.7 C)  TempSrc:      SpO2: 97% 95% 97% 99%   Weight:      Height:       Filed Weights   08/01/21 1613 08/10/21 1517  Weight: 67.1 kg 68.4 kg   Body mass index is 20.45 kg/m.    Exam:  Awake mildly confused, No new F.N deficits, Normal affect [name],PERRAL Supple Neck,No JVD, No cervical lymphadenopathy appriciated.  Symmetrical Chest wall movement, Good air movement bilaterally, CTAB RRR,No Gallops, Rubs or new Murmurs, No Parasternal Heave +ve B.Sounds, Abd Soft, No tenderness, No organomegaly appriciated, No rebound - guarding or rigidity. No Cyanosis, Clubbing or edema, No new Rash or bruise    I have personally reviewed following labs and imaging studies  LABORATORY DATA:  Recent Labs  Lab 08/13/21 0442  WBC 4.2  HGB 8.2*  HCT 25.4*  PLT 66*  MCV 92.0  MCH 29.7  MCHC 32.3  RDW 25.2*    Recent Labs  Lab 08/07/21 0430 08/08/21 0748 08/09/21 0453 08/13/21 0442  NA 137 137 137 139  K 4.0 3.8 4.2 3.8  CL 109 107 107 112*  CO2 25 26 24 23   GLUCOSE 85 95 80 87  BUN 19 19 17 17   CREATININE 0.83 0.92 0.85 0.71  CALCIUM 7.8* 7.8* 8.0* 8.0*  MG 1.7  --   --  1.6*     MICROBIOLOGY: No results found for this or any previous visit (from the past 240 hour(s)).   RADIOLOGY STUDIES/RESULTS: No results found.   LOS: 12 days   Signature  08/15/21 M.D on 08/13/2021 at 8:49 AM   -  To page go to [email or web address]

## 2021-08-14 DIAGNOSIS — L03114 Cellulitis of left upper limb: Secondary | ICD-10-CM | POA: Diagnosis not present

## 2021-08-14 LAB — MAGNESIUM: Magnesium: 1.8 mg/dL (ref 1.7–2.4)

## 2021-08-14 MED ORDER — MAGNESIUM SULFATE IN D5W 1-5 GM/100ML-% IV SOLN
1.0000 g | Freq: Once | INTRAVENOUS | Status: AC
  Administered 2021-08-14: 1 g via INTRAVENOUS
  Filled 2021-08-14: qty 100

## 2021-08-14 NOTE — Care Management Important Message (Signed)
Important Message  Patient Details  Name: Johnny Dunlap MRN: [id number] Date of Birth: 1950-07-28   Medicare Important Message Given:  Yes     [name] A Nikki Glanzer 08/14/2021, 2:07 PM

## 2021-08-14 NOTE — TOC Progression Note (Signed)
Transition of Care [facility]) - Progression Note    Patient Details  Name: Johnny Dunlap MRN: [id number] Date of Birth: 18-Jan-1950  [facility] CM/SW Contact  Allayne Butcher, RN Phone Number: 08/14/2021, 11:26 AM  Clinical Narrative:    [employer] Medicare has approved [employer] for SNF.  Patient is not ready today due to magnesium needed to be replaced and midodrine added to help patient's blood pressure.  Plan for discharge to [facility] tomorrow.    Expected Discharge Plan: Skilled Nursing Facility Barriers to Discharge: Continued Medical Work up  Expected Discharge Plan and Services Expected Discharge Plan: Skilled Nursing Facility   Discharge Planning Services: CM Consult Post Acute Care Choice: Skilled Nursing Facility Living arrangements for the past 2 months: Homeless Expected Discharge Date: 08/08/21               DME Arranged: N/A         HH Arranged: NA           Social Determinants of Health (SDOH) Interventions    Readmission Risk Interventions No flowsheet data found.

## 2021-08-14 NOTE — Progress Notes (Signed)
PROGRESS NOTE        PATIENT DETAILS Name: Johnny Dunlap Age: 71 y.o. Sex: male Date of Birth: November 20, 1950 Admit Date: 08/01/2021 Admitting Physician Alford Highland, MD PCP:Pcp, No  Brief Narrative: Patient is a 71 y.o. male with history of liver cirrhosis, COPD, recent COVID 19 infection-presenting with left forearm cellulitis.  Significant events: 8/2>> admit for left forearm soft tissue infection  Significant studies: 8/2>> x-ray chest: No pneumonia-small right pleural effusion 8/2>> x-ray left elbow/left wrist: Diffuse soft tissue swelling 8/3>> x-ray left forearm: Diffuse soft tissue swelling  Antimicrobial therapy: Ancef: 8/3>>8/7 Keflex: 8/7>> 8/9  Microbiology data: 8/2>> blood culture 1/2: Staph hominis  Procedures : None  Consults: Orthopedics  DVT Prophylaxis : SCDs Start: 08/01/21 1807 Place TED hose Start: 08/01/21 1807   Subjective:  Patient in bed, appears comfortable, denies any headache, no fever, no chest pain or pressure, no shortness of breath , no abdominal pain. No new focal weakness.     Assessment/Plan:  Sepsis due to left forearm cellulitis: Sepsis physiology has improved-left forearm cellulitis has markedly improved-very minimal erythema present.  Has completed a course of antimicrobial therapy.  1/2 blood cultures positive for staph hominis which is probably a contamination and of no clinical significance.    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  We will change Lasix to as needed-stop Aldactone.  Try to counsel patient about he really does not want to continue diuretics as it makes him urinate too much.  Plan would be to continue counseling and reassess in the outpatient setting.  Recent COVID-19  infection: Currently asymptomatic-off isolation as of 08/03/21  Hypomagnesemia: Replaced and stable.  Homelessness: will need CM/SW involvement prior to discharge.  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  On midodrine.  Symptom-free.    Nutrition Status: Nutrition Problem: Severe Malnutrition Etiology: social / environmental circumstances Signs/Symptoms: severe fat depletion, severe muscle depletion Interventions: Ensure Enlive (each supplement provides 350kcal and 20 grams of protein), MVI, Magic cup     Diet: Diet Order             Diet - low sodium heart healthy           Diet regular Room service appropriate? Yes; Fluid consistency: Thin  Diet effective now                    Code Status: Full code   Family Communication: None at bedside  Disposition Plan: Status is: Inpatient  Remains inpatient appropriate because:Inpatient level of care appropriate due to severity of illness  Dispo: The patient is from: Home              Anticipated d/c is to: SNF              Patient currently is medically stable to d/c.   Difficult to place patient No   Barriers to Discharge:  Awaiting SNF placement  Time spent: 15 minutes-Greater than 50% of this time was spent in counseling, explanation of diagnosis, planning of further management, and coordination of care.  MEDICATIONS: Scheduled Meds:  diclofenac Sodium  2 g Topical QID   feeding supplement  237 mL Oral BID BM   gabapentin  300 mg Oral BID   midodrine  10 mg Oral TID WC   multivitamin with minerals  1 tablet Oral Daily   Continuous Infusions:  magnesium sulfate bolus IVPB 1 g (08/14/21 0932)    PRN Meds:.acetaminophen **OR** acetaminophen, albuterol, furosemide, ondansetron **OR** ondansetron (ZOFRAN) IV, oxyCODONE, traZODone   PHYSICAL EXAM: Vital signs: Vitals:   08/14/21 0042 08/14/21 0639 08/14/21 0651 08/14/21 0824  BP: 99/70 (!) 89/60 111/66 (!) 86/52  Pulse: 72 63 68 62  Resp: 16 18  17   Temp:  98.2 F (36.8 C) (!) 97.5 F (36.4 C)  98.1 F (36.7 C)  TempSrc: Oral Oral    SpO2: 97% 93%  95%  Weight:      Height:       Filed Weights   08/01/21 1613 08/10/21 1517  Weight: 67.1 kg 68.4 kg   Body mass index is 20.45 kg/m.    Exam:  Awake Alert, No new F.N deficits, Normal affect [place].AT,PERRAL Supple Neck,No JVD, No cervical lymphadenopathy appriciated.  Symmetrical Chest wall movement, Good air movement bilaterally, CTAB RRR,No Gallops, Rubs or new Murmurs, No Parasternal Heave +ve B.Sounds, Abd Soft, No tenderness, No organomegaly appriciated, No rebound - guarding or rigidity. No Cyanosis, Clubbing or edema, No new Rash or bruise  I have personally reviewed following labs and imaging studies  LABORATORY DATA:  Recent Labs  Lab 08/13/21 0442  WBC 4.2  HGB 8.2*  HCT 25.4*  PLT 66*  MCV 92.0  MCH 29.7  MCHC 32.3  RDW 25.2*    Recent Labs  Lab 08/08/21 0748 08/09/21 0453 08/13/21 0442 08/14/21 0652  NA 137 137 139  --   K 3.8 4.2 3.8  --   CL 107 107 112*  --   CO2 26 24 23   --   GLUCOSE 95 80 87  --   BUN 19 17 17   --   CREATININE 0.92 0.85 0.71  --   CALCIUM 7.8* 8.0* 8.0*  --   MG  --   --  1.6* 1.8     MICROBIOLOGY: No results found for this or any previous visit (from the past 240 hour(s)).   RADIOLOGY STUDIES/RESULTS: No results found.   LOS: 13 days   Signature  08/16/21 M.D on 08/14/2021 at 9:38 AM   -  To page go to [email or web address]

## 2021-08-15 DIAGNOSIS — L03114 Cellulitis of left upper limb: Secondary | ICD-10-CM | POA: Diagnosis not present

## 2021-08-15 NOTE — Progress Notes (Signed)
Attempt made to call report to [facility]...transferred to a voicemail, left a message with a call back phone number.

## 2021-08-15 NOTE — Discharge Instructions (Signed)
Follow with Primary MD  in 7 days   Get CBC, CMP, 2 view Chest X ray -  checked next visit within 1 week by Primary MD or SNF MD   Activity: As tolerated with Full fall precautions use walker/cane & assistance as needed  Disposition SNF  Diet: Heart Healthy  with feeding assistance and aspiration precautions.  Special Instructions: If you have smoked or chewed Tobacco  in the last 2 yrs please stop smoking, stop any regular Alcohol  and or any Recreational drug use.  On your next visit with your primary care physician please Get Medicines reviewed and adjusted.  Please request your Prim.MD to go over all Hospital Tests and Procedure/Radiological results at the follow up, please get all Hospital records sent to your Prim MD by signing hospital release before you go home.  If you experience worsening of your admission symptoms, develop shortness of breath, life threatening emergency, suicidal or homicidal thoughts you must seek medical attention immediately by calling 911 or calling your MD immediately  if symptoms less severe.  You Must read complete instructions/literature along with all the possible adverse reactions/side effects for all the Medicines you take and that have been prescribed to you. Take any new Medicines after you have completely understood and accpet all the possible adverse reactions/side effects.

## 2021-08-15 NOTE — Progress Notes (Signed)
PROGRESS NOTE        PATIENT DETAILS Name: Johnny Dunlap Age: 71 y.o. Sex: male Date of Birth: 05/15/1950 Admit Date: 08/01/2021 Admitting Physician Alford Highland, MD PCP:Pcp, No  Brief Narrative: Patient is a 71 y.o. male with history of liver cirrhosis, COPD, recent COVID 19 infection-presenting with left forearm cellulitis.  Significant events: 8/2>> admit for left forearm soft tissue infection  Significant studies: 8/2>> x-ray chest: No pneumonia-small right pleural effusion 8/2>> x-ray left elbow/left wrist: Diffuse soft tissue swelling 8/3>> x-ray left forearm: Diffuse soft tissue swelling  Antimicrobial therapy: Ancef: 8/3>>8/7 Keflex: 8/7>> 8/9  Microbiology data: 8/2>> blood culture 1/2: Staph hominis  Procedures : None  Consults: Orthopedics  DVT Prophylaxis : SCDs Start: 08/01/21 1807 Place TED hose Start: 08/01/21 1807   Subjective:  Patient in bed, appears comfortable, denies any headache, no fever, no chest pain or pressure, no shortness of breath , no abdominal pain. No new focal weakness.   Assessment/Plan:  Sepsis due to left forearm cellulitis: Sepsis physiology has improved-left forearm cellulitis has markedly improved-very minimal erythema present.  Has completed a course of antimicrobial therapy.  1/2 blood cultures positive for staph hominis which is probably a contamination and of no clinical significance.    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  We will change Lasix to as needed-stop Aldactone.  Try to counsel patient about he really does not want to continue diuretics as it makes him urinate too much.  Plan would be to continue counseling and reassess in the outpatient setting.  Recent COVID-19 infection:  Currently asymptomatic-off isolation as of 08/03/21  Hypomagnesemia: Replaced and stable.  Homelessness: will need CM/SW involvement prior to discharge.  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  On midodrine.  Symptom-free.    Nutrition Status: Nutrition Problem: Severe Malnutrition Etiology: social / environmental circumstances Signs/Symptoms: severe fat depletion, severe muscle depletion Interventions: Ensure Enlive (each supplement provides 350kcal and 20 grams of protein), MVI, Magic cup     Diet: Diet Order             Diet - low sodium heart healthy           Diet regular Room service appropriate? Yes; Fluid consistency: Thin  Diet effective now                    Code Status: Full code   Family Communication: None at bedside  Disposition Plan: Status is: Inpatient  Remains inpatient appropriate because:Inpatient level of care appropriate due to severity of illness  Dispo: The patient is from: Home              Anticipated d/c is to: SNF              Patient currently is medically stable to d/c.   Difficult to place patient No   Barriers to Discharge:  Awaiting SNF placement  Time spent: 15 minutes-Greater than 50% of this time was spent in counseling, explanation of diagnosis, planning of further management, and coordination of care.  MEDICATIONS: Scheduled Meds:  diclofenac Sodium  2 g Topical QID   feeding supplement  237 mL Oral BID BM   gabapentin  300 mg Oral BID   midodrine  10  mg Oral TID WC   multivitamin with minerals  1 tablet Oral Daily   Continuous Infusions:    PRN Meds:.acetaminophen **OR** acetaminophen, albuterol, furosemide, ondansetron **OR** ondansetron (ZOFRAN) IV, oxyCODONE, traZODone   PHYSICAL EXAM: Vital signs: Vitals:   08/14/21 1615 08/14/21 1955 08/15/21 0428 08/15/21 0739  BP: (!) 96/57 (!) 105/58 102/65 (!) 99/57  Pulse: 71 71 68 67  Resp: 16 18 18 20   Temp: 98.3 F (36.8 C) 98.3 F (36.8 C) 98.4 F (36.9 C)  98 F (36.7 C)  TempSrc:      SpO2: 94% 95% 99% 99%  Weight:      Height:       Filed Weights   08/01/21 1613 08/10/21 1517  Weight: 67.1 kg 68.4 kg   Body mass index is 20.45 kg/m.    Exam:  Awake Alert, No new F.N deficits, Normal affect [place].AT,PERRAL Supple Neck,No JVD, No cervical lymphadenopathy appriciated.  Symmetrical Chest wall movement, Good air movement bilaterally, CTAB RRR,No Gallops, Rubs or new Murmurs, No Parasternal Heave +ve B.Sounds, Abd Soft, No tenderness, No organomegaly appriciated, No rebound - guarding or rigidity. No Cyanosis, Clubbing or edema, No new Rash or bruise   I have personally reviewed following labs and imaging studies  LABORATORY DATA:  Recent Labs  Lab 08/13/21 0442  WBC 4.2  HGB 8.2*  HCT 25.4*  PLT 66*  MCV 92.0  MCH 29.7  MCHC 32.3  RDW 25.2*    Recent Labs  Lab 08/09/21 0453 08/13/21 0442 08/14/21 0652  NA 137 139  --   K 4.2 3.8  --   CL 107 112*  --   CO2 24 23  --   GLUCOSE 80 87  --   BUN 17 17  --   CREATININE 0.85 0.71  --   CALCIUM 8.0* 8.0*  --   MG  --  1.6* 1.8     MICROBIOLOGY: No results found for this or any previous visit (from the past 240 hour(s)).   RADIOLOGY STUDIES/RESULTS: No results found.   LOS: 14 days   Signature  08/16/21 M.D on 08/15/2021 at 8:42 AM   -  To page go to [email or web address]

## 2021-08-15 NOTE — Progress Notes (Addendum)
Pt. spoke with floor manager Kristie RN before discharge about personal belongings misplaced in ED when admitted. Management aware and case management was aware prior to discharge.  Pt. discharged via EMS transport, never received call back from [place] to provide report after voicemail was left.  IV lines removed as ordered.    08/15/21 1100  Vitals  BP 110/72  MAP (mmHg) 82  BP Location Left Arm  BP Method Automatic  Patient Position (if appropriate) Lying  Pulse Rate 90  MEWS COLOR  MEWS Score Color Green  Oxygen Therapy  SpO2 98 %  O2 Device Room [id number]

## 2021-08-15 NOTE — TOC Transition Note (Signed)
Transition of Care [facility]) - CM/SW Discharge Note   Patient Details  Name: Morris Longenecker MRN: [id number] Date of Birth: 1950-03-04  [facility]) CM/SW Contact:  Allayne Butcher, RN Phone Number: 08/15/2021, 9:51 AM   Clinical Narrative:    Patient medically cleared for discharge to [facility] today.  Patient will be going to room 119B.  RNCM will arrange EMS transport.  Bedside RN to call report.   Final next level of care: Skilled Nursing Facility Barriers to Discharge: Barriers Resolved   Patient Goals and CMS Choice Patient states their goals for this hospitalization and ongoing recovery are:: Patient wants to find a place to live that doesn't make him crazy and take everything aware from him CMS Medicare.gov Compare Post Acute Care list provided to:: Patient Choice offered to / list presented to : Patient  Discharge Placement              Patient chooses bed at: Other - please specify in the comment section below: [facility]) Patient to be transferred to facility by: [place] Name of family member notified: none Patient and family notified of of transfer: 08/15/21  Discharge Plan and Services   Discharge Planning Services: CM Consult Post Acute Care Choice: Skilled Nursing Facility          DME Arranged: N/A         HH Arranged: NA          Social Determinants of Health (SDOH) Interventions     Readmission Risk Interventions No flowsheet data found.

## 2021-08-15 NOTE — TOC Progression Note (Signed)
Transition of Care [facility]) - Progression Note    Patient Details  Name: Johnny Dunlap MRN: [id number] Date of Birth: March 19, 1950  Transition of Care [facility]) CM/SW Contact  Allayne Butcher, RN Phone Number: 08/15/2021, 11:05 AM  Clinical Narrative:    EMS scheduled patient is second on the list for pick up.   Expected Discharge Plan: Skilled Nursing Facility Barriers to Discharge: Barriers Resolved  Expected Discharge Plan and Services Expected Discharge Plan: Skilled Nursing Facility   Discharge Planning Services: CM Consult Post Acute Care Choice: Skilled Nursing Facility Living arrangements for the past 2 months: Homeless Expected Discharge Date: 08/15/21               DME Arranged: N/A         HH Arranged: NA           Social Determinants of Health (SDOH) Interventions    Readmission Risk Interventions No flowsheet data found.

## 2021-09-19 ENCOUNTER — Emergency Department (HOSPITAL_COMMUNITY): Payer: Medicare (Managed Care)

## 2021-09-19 ENCOUNTER — Inpatient Hospital Stay (HOSPITAL_COMMUNITY): Payer: Medicare (Managed Care)

## 2021-09-19 ENCOUNTER — Inpatient Hospital Stay (HOSPITAL_COMMUNITY)
Admission: EM | Admit: 2021-09-19 | Discharge: 2021-09-23 | DRG: 441 | Disposition: A | Discharge status: Skilled Nursing Facility | Payer: Medicare (Managed Care) | Attending: Internal Medicine | Admitting: Internal Medicine

## 2021-09-19 ENCOUNTER — Other Ambulatory Visit: Payer: Self-pay

## 2021-09-19 DIAGNOSIS — Z8616 Personal history of COVID-19: Secondary | ICD-10-CM

## 2021-09-19 DIAGNOSIS — Z66 Do not resuscitate: Secondary | ICD-10-CM | POA: Diagnosis present

## 2021-09-19 DIAGNOSIS — E860 Dehydration: Secondary | ICD-10-CM | POA: Diagnosis present

## 2021-09-19 DIAGNOSIS — D509 Iron deficiency anemia, unspecified: Secondary | ICD-10-CM | POA: Diagnosis present

## 2021-09-19 DIAGNOSIS — Z993 Dependence on wheelchair: Secondary | ICD-10-CM

## 2021-09-19 DIAGNOSIS — Z79899 Other long term (current) drug therapy: Secondary | ICD-10-CM | POA: Diagnosis not present

## 2021-09-19 DIAGNOSIS — D6959 Other secondary thrombocytopenia: Secondary | ICD-10-CM | POA: Diagnosis present

## 2021-09-19 DIAGNOSIS — Z8249 Family history of ischemic heart disease and other diseases of the circulatory system: Secondary | ICD-10-CM | POA: Diagnosis not present

## 2021-09-19 DIAGNOSIS — I8510 Secondary esophageal varices without bleeding: Secondary | ICD-10-CM | POA: Diagnosis present

## 2021-09-19 DIAGNOSIS — I959 Hypotension, unspecified: Secondary | ICD-10-CM | POA: Diagnosis present

## 2021-09-19 DIAGNOSIS — K7469 Other cirrhosis of liver: Secondary | ICD-10-CM | POA: Diagnosis present

## 2021-09-19 DIAGNOSIS — S51012A Laceration without foreign body of left elbow, initial encounter: Secondary | ICD-10-CM | POA: Diagnosis present

## 2021-09-19 DIAGNOSIS — K7200 Acute and subacute hepatic failure without coma: Principal | ICD-10-CM | POA: Diagnosis present

## 2021-09-19 DIAGNOSIS — Z681 Body mass index (BMI) 19 or less, adult: Secondary | ICD-10-CM | POA: Diagnosis not present

## 2021-09-19 DIAGNOSIS — E43 Unspecified severe protein-calorie malnutrition: Secondary | ICD-10-CM | POA: Diagnosis present

## 2021-09-19 DIAGNOSIS — U071 COVID-19: Secondary | ICD-10-CM | POA: Diagnosis not present

## 2021-09-19 DIAGNOSIS — R338 Other retention of urine: Secondary | ICD-10-CM | POA: Diagnosis not present

## 2021-09-19 DIAGNOSIS — F0390 Unspecified dementia without behavioral disturbance: Secondary | ICD-10-CM | POA: Diagnosis present

## 2021-09-19 DIAGNOSIS — Z7189 Other specified counseling: Secondary | ICD-10-CM | POA: Diagnosis not present

## 2021-09-19 DIAGNOSIS — E872 Acidosis: Secondary | ICD-10-CM | POA: Diagnosis present

## 2021-09-19 DIAGNOSIS — D696 Thrombocytopenia, unspecified: Secondary | ICD-10-CM | POA: Diagnosis not present

## 2021-09-19 DIAGNOSIS — F17210 Nicotine dependence, cigarettes, uncomplicated: Secondary | ICD-10-CM | POA: Diagnosis present

## 2021-09-19 DIAGNOSIS — W19XXXA Unspecified fall, initial encounter: Secondary | ICD-10-CM | POA: Diagnosis present

## 2021-09-19 DIAGNOSIS — R4182 Altered mental status, unspecified: Secondary | ICD-10-CM | POA: Diagnosis not present

## 2021-09-19 DIAGNOSIS — N390 Urinary tract infection, site not specified: Secondary | ICD-10-CM | POA: Diagnosis present

## 2021-09-19 DIAGNOSIS — J189 Pneumonia, unspecified organism: Secondary | ICD-10-CM | POA: Diagnosis present

## 2021-09-19 DIAGNOSIS — K7682 Hepatic encephalopathy: Secondary | ICD-10-CM | POA: Diagnosis present

## 2021-09-19 DIAGNOSIS — Z515 Encounter for palliative care: Secondary | ICD-10-CM | POA: Diagnosis not present

## 2021-09-19 LAB — URINALYSIS, ROUTINE W REFLEX MICROSCOPIC
Bilirubin Urine: NEGATIVE
Glucose, UA: NEGATIVE mg/dL
Ketones, ur: NEGATIVE mg/dL
Nitrite: POSITIVE — AB
Protein, ur: NEGATIVE mg/dL
Specific Gravity, Urine: 1.015 (ref 1.005–1.030)
pH: 7.5 (ref 5.0–8.0)

## 2021-09-19 LAB — URINALYSIS, MICROSCOPIC (REFLEX)
RBC / HPF: >50 RBC/hpf (ref 0–5)
Squamous Epithelial / HPF: NONE SEEN (ref 0–5)

## 2021-09-19 LAB — CBC WITH DIFFERENTIAL/PLATELET
Abs Immature Granulocytes: 0.02 10*3/uL (ref 0.00–0.07)
Basophils Absolute: 0.1 10*3/uL (ref 0.0–0.1)
Basophils Relative: 1 %
Eosinophils Absolute: 0.3 10*3/uL (ref 0.0–0.5)
Eosinophils Relative: 6 %
HCT: 29.6 % — ABNORMAL LOW (ref 39.0–52.0)
Hemoglobin: 9.6 g/dL — ABNORMAL LOW (ref 13.0–17.0)
Immature Granulocytes: 1 %
Lymphocytes Relative: 19 %
Lymphs Abs: 0.9 10*3/uL (ref 0.7–4.0)
MCH: 32.2 pg (ref 26.0–34.0)
MCHC: 32.4 g/dL (ref 30.0–36.0)
MCV: 99.3 fL (ref 80.0–100.0)
Monocytes Absolute: 0.4 10*3/uL (ref 0.1–1.0)
Monocytes Relative: 10 %
Neutro Abs: 2.8 10*3/uL (ref 1.7–7.7)
Neutrophils Relative %: 63 %
Platelets: 53 10*3/uL — ABNORMAL LOW (ref 150–400)
RBC: 2.98 MIL/uL — ABNORMAL LOW (ref 4.22–5.81)
RDW: 19 % — ABNORMAL HIGH (ref 11.5–15.5)
WBC: 4.4 10*3/uL (ref 4.0–10.5)
nRBC: 0 % (ref 0.0–0.2)

## 2021-09-19 LAB — BLOOD GAS, VENOUS
Acid-base deficit: 2.5 mmol/L — ABNORMAL HIGH (ref 0.0–2.0)
Bicarbonate: 21.2 mmol/L (ref 20.0–28.0)
O2 Saturation: 47.9 %
Patient temperature: 98.6
pCO2, Ven: 34.5 mmHg — ABNORMAL LOW (ref 44.0–60.0)
pH, Ven: 7.405 (ref 7.250–7.430)
pO2, Ven: 34.2 mmHg (ref 32.0–45.0)

## 2021-09-19 LAB — LIPASE, BLOOD: Lipase: 38 U/L (ref 11–51)

## 2021-09-19 LAB — COMPREHENSIVE METABOLIC PANEL
ALT: 22 U/L (ref 0–44)
AST: 41 U/L (ref 15–41)
Albumin: 2.9 g/dL — ABNORMAL LOW (ref 3.5–5.0)
Alkaline Phosphatase: 161 U/L — ABNORMAL HIGH (ref 38–126)
Anion gap: 7 (ref 5–15)
BUN: 25 mg/dL — ABNORMAL HIGH (ref 8–23)
CO2: 22 mmol/L (ref 22–32)
Calcium: 9.4 mg/dL (ref 8.9–10.3)
Chloride: 113 mmol/L — ABNORMAL HIGH (ref 98–111)
Creatinine, Ser: 0.95 mg/dL (ref 0.61–1.24)
GFR, Estimated: >60 mL/min (ref >60)
Glucose, Bld: 100 mg/dL — ABNORMAL HIGH (ref 70–99)
Potassium: 4 mmol/L (ref 3.5–5.1)
Sodium: 142 mmol/L (ref 135–145)
Total Bilirubin: 3.9 mg/dL — ABNORMAL HIGH (ref 0.3–1.2)
Total Protein: 7 g/dL (ref 6.5–8.1)

## 2021-09-19 LAB — AMMONIA: Ammonia: 80 umol/L — ABNORMAL HIGH (ref 9–35)

## 2021-09-19 LAB — LACTIC ACID, PLASMA
Lactic Acid, Venous: 2 mmol/L (ref 0.5–1.9)
Lactic Acid, Venous: 2.1 mmol/L (ref 0.5–1.9)

## 2021-09-19 LAB — PROTIME-INR
INR: 1.7 — ABNORMAL HIGH (ref 0.8–1.2)
Prothrombin Time: 19.8 seconds — ABNORMAL HIGH (ref 11.4–15.2)

## 2021-09-19 MED ORDER — AZITHROMYCIN 500 MG IV SOLR
500.0000 mg | INTRAVENOUS | Status: DC
  Administered 2021-09-20: 500 mg via INTRAVENOUS
  Filled 2021-09-19: qty 500

## 2021-09-19 MED ORDER — SODIUM CHLORIDE 0.9 % IV SOLN
1.0000 g | Freq: Once | INTRAVENOUS | Status: AC
  Administered 2021-09-19: 1 g via INTRAVENOUS
  Filled 2021-09-19: qty 10

## 2021-09-19 MED ORDER — SODIUM CHLORIDE 0.9 % IV BOLUS
500.0000 mL | Freq: Once | INTRAVENOUS | Status: AC
  Administered 2021-09-19: 500 mL via INTRAVENOUS

## 2021-09-19 MED ORDER — IBUPROFEN 800 MG PO TABS
800.0000 mg | ORAL_TABLET | Freq: Once | ORAL | Status: AC
  Administered 2021-09-19: 800 mg via ORAL
  Filled 2021-09-19: qty 1

## 2021-09-19 MED ORDER — IOHEXOL 350 MG/ML SOLN
80.0000 mL | Freq: Once | INTRAVENOUS | Status: AC | PRN
  Administered 2021-09-19: 80 mL via INTRAVENOUS

## 2021-09-19 MED ORDER — SODIUM CHLORIDE 0.9 % IV SOLN
2.0000 g | INTRAVENOUS | Status: DC
  Administered 2021-09-20 – 2021-09-22 (×3): 2 g via INTRAVENOUS
  Filled 2021-09-19 (×4): qty 20

## 2021-09-19 MED ORDER — LACTULOSE 10 GM/15ML PO SOLN
10.0000 g | Freq: Three times a day (TID) | ORAL | Status: DC
  Administered 2021-09-19: 10 g via ORAL
  Filled 2021-09-19: qty 30

## 2021-09-19 MED ORDER — SODIUM CHLORIDE 0.9 % IV SOLN
500.0000 mg | Freq: Once | INTRAVENOUS | Status: AC
  Administered 2021-09-19: 500 mg via INTRAVENOUS
  Filled 2021-09-19: qty 500

## 2021-09-19 NOTE — ED Notes (Signed)
Skin tear on right arm bandaged and wrapped up.

## 2021-09-19 NOTE — ED Notes (Signed)
Pt given snack. Pt offered hospital gown but declines saying his facility gown is more comfortable. Pt repositioned. Linens changed. New warm blankets offered but declined.

## 2021-09-19 NOTE — ED Triage Notes (Signed)
BIBA Per EMS: Pt coming [place] with a fall that occurred today. Hit head; denies blood thinners; denies LOC; denies any injuries other than a small skin tear to L elbow.  Nursing staff stated pt has been more confused just today which has resulted in his fall. A&Ox3  Hx dementia  102/58 54 HR  100% RA  CBG 104

## 2021-09-19 NOTE — ED Notes (Signed)
Patient transported to CT 

## 2021-09-19 NOTE — H&P (Addendum)
Mott Humble DVV:[id number] DOB: 21-Mar-1950 DOA: 09/19/2021     PCP: Oneita Hurt, No   Outpatient Specialists:   NONE    Patient arrived to ER on 09/19/21 at 1654 Referred by Attending Ernie Avena, MD   Patient coming from:  From facility [facility]  Chief Complaint  Chief Complaint  Patient presents with   Altered Mental Status   Fall   Nausea    HPI: Johnny Dunlap is a 71 y.o. male with medical history significant of  liver cirrhosis, COPD,  severe malnutrition, dimension    Presented with fall that happened today he hit his head had a small skin tear of the left elbow.  Has been somewhat more confused today On EMS arrival blood pressure 102/58 heart rate 54 satting 100% room air CBG 104 Facility was saying that he has been more confused and been falling more often. In August 2022 was admitted for left forearm cellulitis blood cultures grew staph home and these treated giving antibiotics  Reports brown sputum low grade fever and cough  Reports slight amount of blood per rectum earlier last week Reports easy bleeding with minimal injury     Has  been vaccinated against COVID   Recently positive  Lab Results  Component Value Date   SARSCOV2NAA POSITIVE (A) 08/01/2021   SARSCOV2NAA POSITIVE (A) 07/23/2021     Regarding pertinent Chronic problems:      HTN  now hypotensive on Midodrine   - last echo  7/22 EF >50% eft ventricular  diastolic parameters are indeterminate.     Liver disease MELD-Na score: 20 at 08/03/2021  5:07 AM History of cirrhosis with coagulopathy thrombocytopenia esophageal varices and ascites.    Dementia - on Neurontin    Chronic anemia - baseline hg Hemoglobin & Hematocrit  Recent Labs    08/03/21 0507 08/13/21 0442 09/19/21 1817  HGB 7.5* 8.2* 9.6*     While in ER: Urine positive for evidence of UTI. Hemoglobin 9.6 platelets down to 53 ammonia elevated at 80 lactic acid elevated 2.0 Patient was given lactulose and  ceftriaxone Given 500 mL normal saline. CT head and spine unremarkable CT abdomen no ascites  ED Triage Vitals  Enc Vitals Group     BP 09/19/21 1714 102/63     Pulse Rate 09/19/21 1714 65     Resp 09/19/21 1714 15     Temp 09/19/21 1714 (!) 97.5 F (36.4 C)     Temp Source 09/19/21 1714 Oral     SpO2 09/19/21 1714 100 %     Weight --      Height --      Head Circumference --      Peak Flow --      Pain Score 09/19/21 1715 4     Pain Loc --      Pain Edu? --      Excl. in GC? --   TMAX(24)@     _________________________________________ Significant initial  Findings: Abnormal Labs Reviewed  URINALYSIS, ROUTINE W REFLEX MICROSCOPIC - Abnormal; Notable for the following components:      Result Value   Hgb urine dipstick LARGE (*)    Nitrite POSITIVE (*)    Leukocytes,Ua SMALL (*)    All other components within normal limits  COMPREHENSIVE METABOLIC PANEL - Abnormal; Notable for the following components:   Chloride 113 (*)    Glucose, Bld 100 (*)    BUN 25 (*)    Albumin 2.9 (*)  Alkaline Phosphatase 161 (*)    Total Bilirubin 3.9 (*)    All other components within normal limits  CBC WITH DIFFERENTIAL/PLATELET - Abnormal; Notable for the following components:   RBC 2.98 (*)    Hemoglobin 9.6 (*)    HCT 29.6 (*)    RDW 19.0 (*)    Platelets 53 (*)    All other components within normal limits  AMMONIA - Abnormal; Notable for the following components:   Ammonia 80 (*)    All other components within normal limits  LACTIC ACID, PLASMA - Abnormal; Notable for the following components:   Lactic Acid, Venous 2.1 (*)    All other components within normal limits  LACTIC ACID, PLASMA - Abnormal; Notable for the following components:   Lactic Acid, Venous 2.0 (*)    All other components within normal limits  URINALYSIS, MICROSCOPIC (REFLEX) - Abnormal; Notable for the following components:   Bacteria, UA RARE (*)    All other components within normal limits    ____________________________________________ Ordered CT HEAD   NON acute Degenerative change in the cervical spine without acute fracture or subluxation.  CXR -  NON acute  CTabd/pelvis -    Cirrhosis  Tree-in-bud opacities in the left lower lobe, likely infectious  ______________   ECG: Ordered Personally reviewed by me showing: HR : 59 Rhythm:  NSR,    no evidence of ischemic changes QTC 472 ________  Not meeting sepsis criteria on admit  The recent clinical data is shown below. Vitals:   09/19/21 1900 09/19/21 2009 09/19/21 2100 09/19/21 2130  BP: (!) 106/53 (!) 111/50 (!) 108/55 (!) 103/54  Pulse: 63 74 (!) 51 (!) 59  Resp: Temp:    98.1 F (36.7 C)  TempSrc:    Oral  SpO2: 100% 100% 100% 100%    WBC     Component Value Date/Time   WBC 4.4 09/19/2021 1817   LYMPHSABS 0.9 09/19/2021 1817   MONOABS 0.4 09/19/2021 1817   EOSABS 0.3 09/19/2021 1817   BASOSABS 0.1 09/19/2021 1817    Lactic Acid, Venous    Component Value Date/Time   LATICACIDVEN 2.1 (HH) 09/19/2021 2052       UA  evidence of UTI      Urine analysis:    Component Value Date/Time   COLORURINE YELLOW 09/19/2021 1919   APPEARANCEUR CLEAR 09/19/2021 1919   LABSPEC 1.015 09/19/2021 1919   PHURINE 7.5 09/19/2021 1919   GLUCOSEU NEGATIVE 09/19/2021 1919   HGBUR LARGE (A) 09/19/2021 1919   BILIRUBINUR NEGATIVE 09/19/2021 1919   KETONESUR NEGATIVE 09/19/2021 1919   PROTEINUR NEGATIVE 09/19/2021 1919   NITRITE POSITIVE (A) 09/19/2021 1919   LEUKOCYTESUR SMALL (A) 09/19/2021 1919     Blood Culture (routine x 2)     Status: Abnormal   Collection Time: 08/01/21  3:03 PM   Specimen: BLOOD  Result Value Ref Range Status   Specimen Description   Final    BLOOD BLOOD RIGHT FOREARM Performed at [facility], [address], [place], [place] [address]    Special Requests   Final    BOTTLES DRAWN AEROBIC AND ANAEROBIC Blood Culture adequate volume Performed at [facility], [address] [place], [place] [address]    Culture  Setup Time   Final    GRAM POSITIVE COCCI ANAEROBIC BOTTLE ONLY CRITICAL RESULT CALLED TO, READ BACK BY AND VERIFIED WITH: DEVIN MITCHELL  08/02/21 SCS    Culture (A)  Final    STAPHYLOCOCCUS HOMINIS THE SIGNIFICANCE OF ISOLATING THIS ORGANISM FROM A SINGLE SET OF BLOOD CULTURES WHEN MULTIPLE SETS ARE DRAWN IS UNCERTAIN. PLEASE NOTIFY THE MICROBIOLOGY DEPARTMENT WITHIN ONE WEEK IF SPECIATION AND SENSITIVITIES ARE REQUIRED. Performed at [facility], [address] [place], [place] [address]    Report Status 08/04/2021 FINAL  Final  Blood Culture (routine x 2)     Status: None   Collection Time: 08/01/21  3:03 PM   Specimen: BLOOD  Result Value Ref Range Status   Specimen Description BLOOD RIGHT ANTECUBITAL  Final   Special Requests   Final    BOTTLES DRAWN AEROBIC AND ANAEROBIC Blood Culture adequate volume   Culture   Final    NO GROWTH 5 DAYS Performed at [facility], [address] [place], [place] [address]    Report Status 08/06/2021 FINAL  Final  Blood Culture ID Panel (Reflexed)     Status: Abnormal   Collection Time: 08/01/21  3:03 PM  Result Value Ref Range Status   Enterococcus faecalis NOT DETECTED NOT DETECTED Final   Enterococcus Faecium NOT DETECTED NOT DETECTED Final   Listeria monocytogenes NOT DETECTED NOT DETECTED Final   Staphylococcus species DETECTED (A) NOT DETECTED Final    Comment: CRITICAL RESULT CALLED TO, READ BACK BY AND VERIFIED WITH: DEVIN MITCHELL  08/02/21 SCS    Staphylococcus aureus (BCID) NOT DETECTED NOT DETECTED Final   Staphylococcus epidermidis NOT DETECTED NOT DETECTED Final   Staphylococcus lugdunensis NOT DETECTED NOT DETECTED Final   Streptococcus species NOT DETECTED NOT DETECTED Final   Streptococcus agalactiae NOT DETECTED NOT DETECTED Final   Streptococcus pneumoniae NOT DETECTED NOT DETECTED Final   Streptococcus pyogenes NOT DETECTED  NOT DETECTED Final   A.calcoaceticus-baumannii NOT DETECTED NOT DETECTED Final   Bacteroides fragilis NOT DETECTED NOT DETECTED Final   Enterobacterales NOT DETECTED NOT DETECTED Final   Enterobacter cloacae complex NOT DETECTED NOT DETECTED Final   Escherichia coli NOT DETECTED NOT DETECTED Final   Klebsiella aerogenes NOT DETECTED NOT DETECTED Final   Klebsiella oxytoca NOT DETECTED NOT DETECTED Final   Klebsiella pneumoniae NOT DETECTED NOT DETECTED Final   Proteus species NOT DETECTED NOT DETECTED Final   Salmonella species NOT DETECTED NOT DETECTED Final   Serratia marcescens NOT DETECTED NOT DETECTED Final   Haemophilus influenzae NOT DETECTED NOT DETECTED Final   Neisseria meningitidis NOT DETECTED NOT DETECTED Final   Pseudomonas aeruginosa NOT DETECTED NOT DETECTED Final   Stenotrophomonas maltophilia NOT DETECTED NOT DETECTED Final   Candida albicans NOT DETECTED NOT DETECTED Final   Candida auris NOT DETECTED NOT DETECTED Final   Candida glabrata NOT DETECTED NOT DETECTED Final   Candida krusei NOT DETECTED NOT DETECTED Final   Candida parapsilosis NOT DETECTED NOT DETECTED Final   Candida tropicalis NOT DETECTED NOT DETECTED Final   Cryptococcus neoformans/gattii NOT DETECTED NOT DETECTED Final    Comment: Performed at [facility], [address], [place], [place] [address]    _______________________________________________ Hospitalist was called for admission for UTI possible PNA and acute hepatic encephalopathy   The following Work up has been ordered so far:  Orders Placed This Encounter  Procedures   CT Cervical Spine Wo Contrast   CT HEAD WO CONTRAST ( )   CT ABDOMEN PELVIS W CONTRAST   Urinalysis, Routine w reflex microscopic Urine, Clean Catch   Lipase, blood   Comprehensive metabolic panel   CBC with Differential   Ammonia   Lactic  acid, plasma   Urinalysis, Microscopic (reflex)   Consult to hospitalist     Following Medications were  ordered in ER: Medications  lactulose (CHRONULAC) 10 GM/15ML solution 10 g (has no administration in time range)  cefTRIAXone (ROCEPHIN) 1 g in sodium chloride 0.9 % 100 mL IVPB (has no administration in time range)  iohexol (OMNIPAQUE) 350 MG/ML injection 80 mL (80 mLs Intravenous Contrast Given 09/19/21 1927)  sodium chloride 0.9 % bolus 500 mL (0 mLs Intravenous Stopped 09/19/21 2052)  ibuprofen (ADVIL) tablet 800 mg (800 mg Oral Given 09/19/21 2049)        Consult Orders  (From admission, onward)           Start     Ordered   09/19/21 2109  Consult to hospitalist  Once       Provider:  (Not yet assigned)  Question Answer Comment  Place call to: [facility]   Reason for Consult Admit      09/19/21 2108              OTHER Significant initial  Findings:  labs showing:    Recent Labs  Lab 09/19/21 1817  NA 142  K 4.0  CO2 22  GLUCOSE 100*  BUN 25*  CREATININE 0.95  CALCIUM 9.4    Cr   Up from baseline see below Lab Results  Component Value Date   CREATININE 0.95 09/19/2021   CREATININE 0.71 08/13/2021   CREATININE 0.85 08/09/2021    Recent Labs  Lab 09/19/21 1817  AST 41  ALT 22  ALKPHOS 161*  BILITOT 3.9*  PROT 7.0  ALBUMIN 2.9*   Lab Results  Component Value Date   CALCIUM 9.4 09/19/2021          Plt: Lab Results  Component Value Date   PLT 53 (L) 09/19/2021    COVID-19 Labs  No results for input(s): DDIMER, FERRITIN, LDH, CRP in the last 72 hours.  Lab Results  Component Value Date   SARSCOV2NAA POSITIVE (A) 08/01/2021   SARSCOV2NAA POSITIVE (A) 07/23/2021    Venous  Blood Gas result:  pH 7.405  pCO2 34.5    Recent Labs  Lab 09/19/21 1817  WBC 4.4  NEUTROABS 2.8  HGB 9.6*  HCT 29.6*  MCV 99.3  PLT 53*    HG/HCT  stable     Component Value Date/Time   HGB 9.6 (L) 09/19/2021 1817   HCT 29.6 (L) 09/19/2021 1817   MCV 99.3 09/19/2021 1817      Recent Labs  Lab 09/19/21 1817  LIPASE 38   Recent Labs   Lab 09/19/21 1817  AMMONIA 80*     Cardiac Panel (last 3 results) No results for input(s): CKTOTAL, CKMB, TROPONINI, RELINDX in the last 72 hours.   BNP (last 3 results) Recent Labs    07/23/21 1328  BNP 278.9*         Cultures:    Component Value Date/Time   SDES  08/01/2021 1503    BLOOD BLOOD RIGHT FOREARM Performed at [facility], [address] [place] [place], [place] [address]    SDES BLOOD RIGHT ANTECUBITAL 08/01/2021 1503   SPECREQUEST  08/01/2021 1503    BOTTLES DRAWN AEROBIC AND ANAEROBIC Blood Culture adequate volume Performed at [facility], [address] [place], [place] [address]    [facility]  08/01/2021 1503    BOTTLES DRAWN AEROBIC AND ANAEROBIC Blood Culture adequate volume   CULT (A) 08/01/2021 1503    STAPHYLOCOCCUS  HOMINIS THE SIGNIFICANCE OF ISOLATING THIS ORGANISM FROM A SINGLE SET OF BLOOD CULTURES WHEN MULTIPLE SETS ARE DRAWN IS UNCERTAIN. PLEASE NOTIFY THE MICROBIOLOGY DEPARTMENT WITHIN ONE WEEK IF SPECIATION AND SENSITIVITIES ARE REQUIRED. Performed at [facility], [address] [place], [place] [address]    CULT  08/01/2021 1503    NO GROWTH 5 DAYS Performed at [facility], [address] [place] [place], [place] [address]    REPTSTATUS 08/04/2021 FINAL 08/01/2021 1503   REPTSTATUS 08/06/2021 FINAL 08/01/2021 1503     Radiological Exams on Admission: DG Chest 2 View  Result Date: 09/19/2021 CLINICAL DATA:  Status post fall. EXAM: CHEST - 2 VIEW COMPARISON:  August 01, 2021 FINDINGS: Chronic appearing increased lung markings are seen without evidence of acute infiltrate, pleural effusion or pneumothorax. The heart size and mediastinal contours are within normal limits. The visualized skeletal structures are unremarkable. IMPRESSION: No active cardiopulmonary disease. Electronically Signed   By: Aram Candela M.D.   On: 09/19/2021 22:15   CT HEAD WO CONTRAST ( )  Result Date: 09/19/2021 CLINICAL DATA:   Head trauma, minor (Age >= 65y) head injury Trauma. Fall off hammock.  Right upper extremity pain. EXAM: CT HEAD WITHOUT CONTRAST TECHNIQUE: Contiguous axial images were obtained from the base of the skull through the vertex without intravenous contrast. COMPARISON:  None. FINDINGS: Brain: No acute hemorrhage. No subdural or extra-axial collection. Generalized atrophy is normal for age. There is advanced periventricular and deep white matter hypodensity, nonspecific but typically chronic small vessel ischemia. No evidence of acute infarct. No hydrocephalus, midline shift, or mass effect. Incidental mega cisterna magna. Vascular: Atherosclerosis of skullbase vasculature without hyperdense vessel or abnormal calcification. Skull: No fracture or focal lesion. Sinuses/Orbits: No acute facial bone fracture. Left nasal bone fracture appears remote. Scattered mucosal thickening of ethmoid air cells. No mastoid effusion. Left cataract resection. Other: No confluent scalp contusion. IMPRESSION: 1. No acute intracranial abnormality. No skull fracture. 2. Advanced chronic small vessel ischemia. Electronically Signed   By: Narda Rutherford M.D.   On: 09/19/2021 20:09   CT Cervical Spine Wo Contrast  Result Date: 09/19/2021 CLINICAL DATA:  Neck trauma (Age >= 65y) fall Fall today striking head. EXAM: CT CERVICAL SPINE WITHOUT CONTRAST TECHNIQUE: Multidetector CT imaging of the cervical spine was performed without intravenous contrast. Multiplanar CT image reconstructions were also generated. COMPARISON:  None. FINDINGS: Alignment: No traumatic subluxation straightening of normal lordosis. Trace anterolisthesis of C4 on C5 and retrolisthesis of C6 on C7. Skull base and vertebrae: No acute fracture. Dens and skull base are intact. There is bony fusion of C1 in the occiput. Non fusion posterior elements of C1. Soft tissues and spinal canal: No prevertebral fluid or swelling. No visible canal hematoma. Disc levels: Degenerative  disc disease at C2-C3, C5-C6 and C6-C7. Scattered facet hypertrophy. Mild disc bulge at C3-C4. No high-grade canal stenosis. Upper chest: Biapical pleuroparenchymal scarring. Emphysema. Calcified granuloma. No acute findings. Other: Carotid calcifications. IMPRESSION: Degenerative change in the cervical spine without acute fracture or subluxation. Electronically Signed   By: Narda Rutherford M.D.   On: 09/19/2021 20:06   CT ABDOMEN PELVIS W CONTRAST  Result Date: 09/19/2021 CLINICAL DATA:  Acute abdominal pain.  Fall today. EXAM: CT ABDOMEN AND PELVIS WITH CONTRAST TECHNIQUE: Multidetector CT imaging of the abdomen and pelvis was performed using the standard protocol following bolus administration of intravenous contrast. CONTRAST:  80mL OMNIPAQUE IOHEXOL 350 MG/ML SOLN COMPARISON:  None. FINDINGS: Lower chest: Tree-in-bud opacities in the  left lower lobe. Coronary artery calcifications, normal heart size. Paraesophageal varices. Hepatobiliary: Cirrhotic hepatic morphology with nodular hepatic contours. No evidence of focal hepatic lesion. Peripheral calcification in the right hepatic lobe. Intraluminal gallstones without pericholecystic inflammation. Marked dilatation of the left portal vein with large recannulated umbilical vein and abdominal collaterals. Pancreas: No ductal dilatation or inflammation. Spleen: Splenomegaly. The spleen spans 14.2 x 10.1 x 10.1 cm (volume = 758 cm^3). No focal splenic abnormality. Adrenals/Urinary Tract: No adrenal nodule. Nonobstructing stones in the lower right kidney. Punctate nonobstructing stone in the upper right kidney. No hydronephrosis. Homogeneous renal enhancement. Small cyst in the left kidney. Diminished excretion on delayed phase imaging. Distended urinary bladder with mild wall thickening. Left lateral bladder diverticulum. No perivesicular fat stranding. Stomach/Bowel: Paraesophageal and perigastric varices. Decompressed stomach. There is wall thickening of the  cecum and ascending colon. Moderate colonic stool burden. No bowel obstruction. Normal appendix. Vascular/Lymphatic: Dilated but patent portal vein. Dilated left portal vein with large recannulated umbilical vein. Large varices extend into umbilical hernia. Prominent portosystemic collaterals in the abdomen and pelvis. Advanced aortic and branch atherosclerosis. No aortic aneurysm. No bulky abdominopelvic adenopathy. Reproductive: Prostate is unremarkable. Other: No ascites. No free air. Upper abdominal ventral abdominal hernia contains collaterals. Umbilical hernia contains collaterals. Musculoskeletal: No acute fracture of the lumbar spine or pelvis. No suspicious bone abnormality. IMPRESSION: 1. Cirrhosis with portal hypertension, splenomegaly, and prominent portosystemic collaterals. No ascites. 2. Mild wall thickening of the cecum and ascending colon. This is a typical location for portal colopathy, infectious or inflammatory colitis is not excluded. 3. Distended urinary bladder with mild wall thickening. Left bladder diverticulum. Suspect chronic bladder outlet obstruction, however recommend correlation with urinalysis to exclude urinary tract infection. 4. Cholelithiasis without gallbladder inflammation. 5. Nonobstructing right nephrolithiasis. 6. Tree-in-bud opacities in the left lower lobe, likely infectious or inflammatory, recommend correlation for aspiration risk factors. Aortic Atherosclerosis (ICD10-I70.0). Electronically Signed   By: Narda Rutherford M.D.   On: 09/19/2021 20:16   _______________________________________________________________________________________________________ Latest  Blood pressure (!) 103/54, pulse (!) 59, temperature 98.1 F (36.7 C), temperature source Oral, resp. rate 15, SpO2 100 %.   Review of Systems:    Pertinent positives include:  fatigue,confusion  Constitutional:  No weight loss, night sweats, Fevers, chills,  weight loss  HEENT:  No headaches, Difficulty  swallowing,Tooth/dental problems,Sore throat,  No sneezing, itching, ear ache, nasal congestion, post nasal drip,  Cardio-vascular:  No chest pain, Orthopnea, PND, anasarca, dizziness, palpitations.no Bilateral lower extremity swelling  GI:  No heartburn, indigestion, abdominal pain, nausea, vomiting, diarrhea, change in bowel habits, loss of appetite, melena, blood in stool, hematemesis Resp:  no shortness of breath at rest. No dyspnea on exertion, No excess mucus, no productive cough, No non-productive cough, No coughing up of blood.No change in color of mucus.No wheezing. Skin:  no rash or lesions. No jaundice GU:  no dysuria, change in color of urine, no urgency or frequency. No straining to urinate.  No flank pain.  Musculoskeletal:  No joint pain or no joint swelling. No decreased range of motion. No back pain.  Psych:  No change in mood or affect. No depression or anxiety. No memory loss.  Neuro: no localizing neurological complaints, no tingling, no weakness, no double vision, no gait abnormality, no slurred speech, no   All systems reviewed and apart from HOPI all are negative _______________________________________________________________________________________________ Past Medical History:   Past Medical History:  Diagnosis Date   Cirrhosis (HCC)    COPD (chronic obstructive  pulmonary disease) (HCC)      Past Surgical History:  Procedure Laterality Date   MOUTH SURGERY     NO PAST SURGERIES      Social History:  Ambulatory  walker  wheelchair bound,      reports that he has been smoking cigarettes. He has never used smokeless tobacco. He reports that he does not currently use alcohol. He reports that he does not use drugs.     Family History:   Family History  Problem Relation Age of Onset   CAD Father    CAD Sister    ______________________________________________________________________________________________ Allergies: No Known Allergies   Prior  to Admission medications   Medication Sig Start Date End Date Taking? Authorizing Provider  feeding supplement (ENSURE ENLIVE / ENSURE PLUS) LIQD Take 237 mLs by mouth 2 (two) times daily between meals. 08/11/21   Ghimire, Werner Lean, MD  furosemide (LASIX) 40 MG tablet Take 1 tablet (40 mg total) by mouth daily as needed for fluid or edema. 08/11/21   Ghimire, Werner Lean, MD  midodrine (PROAMATINE) 10 MG tablet Take 0.5 tablets (5 mg total) by mouth 3 (three) times daily with meals. Hold if SBP is more than 130 08/11/21   Ghimire, Werner Lean, MD  Multiple Vitamin (MULTIVITAMIN WITH MINERALS) TABS tablet Take 1 tablet by mouth daily. 08/09/21   Maretta Bees, MD    ___________________________________________________________________________________________________ Physical Exam: Vitals with BMI 09/19/2021 09/19/2021 09/19/2021  Height - - -  Weight - - -  BMI - - -  Systolic 103 108 101  Diastolic 54 55 50  Pulse 59 51 74     1. General:  in No  Acute distress   Chronically ill   -appearing 2. Psychological: Alert and  Oriented 3. Head/ENT:   Dry Mucous Membranes                          Head Non traumatic, neck supple                        Poor Dentition 4. SKIN: decreased Skin turgor,  Skin clean Dry and intact no rash 5. Heart: Regular rate and rhythm no  Murmur, no Rub or gallop 6. Lungs:   no wheezes or crackles   7. Abdomen: Soft,  non-tender, Non distended   8. Lower extremities: no clubbing, cyanosis, no  edema 9. Neurologically Grossly intact, moving all 4 extremities equally  asterixis noted 10. MSK: Normal range of motion    Chart has been reviewed  ______________________________________________________________________________________________  Assessment/Plan 71 y.o. male with medical history significant of  liver cirrhosis, COPD,  severe malnutrition, dimension   Admitted for UTI possible PNA and acute hepatic encephalopathy  Present on Admission:  Acute hepatic  encephalopathy treated with lactulose repeat ammonia in a.m. Marland Kitchen  Thrombocytopenia (HCC) -secondary to liver cirrhosis continue to monitor  Reported easy bleeding and slight amount of blood per rectum last week    Protein-calorie malnutrition, severe -order nutritional consult check prealbumin  Urinary retention bladder scan showed >600 ml foley placed with good drainage   Other cirrhosis of liver (HCC) -chronicMELD-Na score: 18 at 09/19/2021 10:48 PM     Iron deficiency anemia chronic stable monitor for any evidence of acute blood loss Hg stable no indication for transfusion tonight   COVID-19 virus infection -in July Tested positive persistently in the past reinfection less likely continue to monitor   CAP (community  acquired pneumonia) -CT abdomen showed tree-in-bud pattern up in the lungs.  Will obtain dedicated chest x-ray. Reports sputum production cough low grade fever Coverage of IV antibiotics Rocephin azithromycin for now await results of sputum cultures blood cultures check Legionella and strep pneumo    Acute lower UTI -  - treat with Rocephin         await results of urine culture and adjust antibiotic coverage as needed  Bright red blood per rectum last week currently resolved, hg stable cont to monitor pt with hx of thrombocytopenia at risk for easy bleeding   At the time of admission not meeting sepsis criteria  Other plan as per orders.  DVT prophylaxis:  SCD      Code Status:    Code Status: Prior DNR/DNI   as per patient   I had personally discussed CODE STATUS with patient      Family Communication:   Family not at  Bedside     Disposition Plan:                             Back to current facility when stable                               Following barriers for discharge:                            Electrolytes corrected                               Anemia stable                              Pain controlled with PO medications                               able to transition to PO antibiotics                             Will need to be able to tolerate PO                                             Would benefit from PT/OT eval prior to DC  Ordered                                      Transition of care consulted                   Nutrition    consulted                              Palliative care    consulted                   Consults called: none  Admission status:  ED Disposition     ED Disposition  Admit   Condition  --  Comment  Hospital Area: [facility] [[id number]]  Level of Care: Progressive [102]  Admit to Progressive based on following criteria: NEUROLOGICAL AND NEUROSURGICAL complex patients with significant risk of instability, who do not meet ICU criteria, yet require close observation or frequent assessment (< / = every 2 - 4 hours) with medical / nursing intervention.  Admit to Progressive based on following criteria: MULTISYSTEM THREATS such as stable sepsis, metabolic/electrolyte imbalance with or without encephalopathy that is responding to early treatment.  May admit patient to [facility] or [facility] if equivalent level of care is available:: No  Covid Evaluation: Recent COVID positive no isolation required infection day 21-90  Diagnosis: Acute hepatic encephalopathy [[id number]]  Admitting Physician: Therisa Doyne [[id number]]  Attending Physician: Therisa Doyne [[id number]]  Estimated length of stay: past midnight tomorrow  Certification:: I certify this patient will need inpatient services for at least 2 midnights            inpatient     I Expect 2 midnight stay secondary to severity of patient's current illness need for inpatient interventions justified by the following:  hemodynamic instability despite optimal treatment (  hypotension )  Severe lab/radiological/exam abnormalities including:    UTI, PNA,  and extensive comorbidities including:  substance abuse    dementia   liver disease   That are currently affecting medical management.   I expect  patient to be hospitalized for 2 midnights requiring inpatient medical care.  Patient is at high risk for adverse outcome (such as loss of life or disability) if not treated.  Indication for inpatient stay as follows:  Severe change from baseline regarding mental status Hemodynamic instability despite maximal medical therapy,    inability to maintain oral hydration     Need for IV antibiotics, I     Level of care   progressive tele indefinitely please discontinue once patient no longer qualifies COVID-19 Labs    Lab Results  Component Value Date   SARSCOV2NAA POSITIVE (A) 08/01/2021     Precautions: admitted as recent COVID infection      PPE: Used by the provider:   N95  eye Goggles,  Gloves    Aubert Choyce 09/20/2021, 12:18 AM    [facility]     after 2 AM please page floor coverage PA If 7AM-7PM, please contact the day team taking care of the patient using [email or web address]   Patient was evaluated in the context of the global COVID-19 pandemic, which necessitated consideration that the patient might be at risk for infection with the SARS-CoV-2 virus that causes COVID-19. Institutional protocols and algorithms that pertain to the evaluation of patients at risk for COVID-19 are in a state of rapid change based on information released by regulatory bodies including the CDC and federal and state organizations. These policies and algorithms were followed during the patient's care.

## 2021-09-19 NOTE — ED Provider Notes (Signed)
[facility] DEPT Provider Note   CSN: [id number] Arrival date & time: 09/19/21  1654   LEVEL 5 CAVEAT: DEMENTIA  History Chief Complaint  Patient presents with   Altered Mental Status   Fall   Nausea    Johnny Dunlap is a 71 y.o. male with history of esophageal varices, cirrhosis, and dementia who presents the emergency department today after a fall.  Patient complains of abdominal pain vomiting.  The facility states that he has been increasingly altered which is causing him to fall.  He did fall and hit his head and is not on blood thinners.  Facility states he did not lose consciousness.  Sent him to the emergency department for further evaluation.  The history is provided by the patient and the EMS personnel. No language interpreter was used.  Altered Mental Status Fall      Past Medical History:  Diagnosis Date   Cirrhosis ([place])    COPD (chronic obstructive pulmonary disease) ([place])     Patient Active Problem List   Diagnosis Date Noted   Protein-calorie malnutrition, severe 08/11/2021   Sepsis ([place]) 08/01/2021   Left arm cellulitis    Lactic acidosis    Other cirrhosis of liver ([facility])    Thrombocytopenia ([place])    COVID-19 virus infection    Iron deficiency anemia    Hypokalemia    GI bleed 07/23/2021    Past Surgical History:  Procedure Laterality Date   MOUTH SURGERY     NO PAST SURGERIES         Family History  Problem Relation Age of Onset   CAD Father    CAD Sister     Social History   Tobacco Use   Smoking status: Some Days    Types: Cigarettes   Smokeless tobacco: Never  Substance Use Topics   Alcohol use: Not Currently   Drug use: Never    Home Medications Prior to Admission medications   Medication Sig Start Date End Date Taking? Authorizing Provider  feeding supplement (ENSURE ENLIVE / ENSURE PLUS) LIQD Take 237 mLs by mouth 2 (two) times daily between meals. 08/11/21   Ghimire, Henreitta Leber, MD   furosemide (LASIX) 40 MG tablet Take 1 tablet (40 mg total) by mouth daily as needed for fluid or edema. 08/11/21   Ghimire, Henreitta Leber, MD  midodrine (PROAMATINE) 10 MG tablet Take 0.5 tablets (5 mg total) by mouth 3 (three) times daily with meals. Hold if SBP is more than 130 08/11/21   Ghimire, Henreitta Leber, MD  Multiple Vitamin (MULTIVITAMIN WITH MINERALS) TABS tablet Take 1 tablet by mouth daily. 08/09/21   Ghimire, Henreitta Leber, MD    Allergies    Patient has no known allergies.  Review of Systems   Review of Systems  Unable to perform ROS: Dementia   Physical Exam Updated Vital Signs BP (!) 111/50   Pulse 74   Temp (!) 97.5 F (36.4 C) (Oral)   Resp 12 Comment: Simultaneous filing. User may not have seen previous data.  SpO2 100%   Physical Exam Constitutional:      General: He is not in acute distress.    Appearance: Normal appearance.  HENT:     Head: Normocephalic and atraumatic.  Eyes:     General:        Right eye: No discharge.        Left eye: No discharge.  Neck:     Comments: Midline cervical tenderness. Cardiovascular:  Comments: Regular rate and rhythm.  S1/S2 are distinct without any evidence of murmur, rubs, or gallops.  Radial pulses are 2+ bilaterally.  Dorsalis pedis pulses are 2+ bilaterally.  No evidence of pedal edema. Pulmonary:     Comments: Clear to auscultation bilaterally.  Normal effort.  No respiratory distress.  No evidence of wheezes, rales, or rhonchi heard throughout. Abdominal:     General: There is no distension.     Tenderness: There is abdominal tenderness. There is no guarding or rebound.  Musculoskeletal:        General: Normal range of motion.     Cervical back: Neck supple.  Skin:    General: Skin is warm and dry.     Findings: No rash.     Comments: Superficial skin tear to the anterior aspect of the right forearm.  No evidence of purulence or surrounding erythema.  Neurological:     General: No focal deficit present.      Mental Status: He is alert.     Comments: Alert and oriented x1.  Psychiatric:        Mood and Affect: Mood normal.        Behavior: Behavior normal.    ED Results / Procedures / Treatments   Labs (all labs ordered are listed, but only abnormal results are displayed) Labs Reviewed  URINALYSIS, ROUTINE W REFLEX MICROSCOPIC - Abnormal; Notable for the following components:      Result Value   Hgb urine dipstick LARGE (*)    Nitrite POSITIVE (*)    Leukocytes,Ua SMALL (*)    All other components within normal limits  COMPREHENSIVE METABOLIC PANEL - Abnormal; Notable for the following components:   Chloride 113 (*)    Glucose, Bld 100 (*)    BUN 25 (*)    Albumin 2.9 (*)    Alkaline Phosphatase 161 (*)    Total Bilirubin 3.9 (*)    All other components within normal limits  CBC WITH DIFFERENTIAL/PLATELET - Abnormal; Notable for the following components:   RBC 2.98 (*)    Hemoglobin 9.6 (*)    HCT 29.6 (*)    RDW 19.0 (*)    Platelets 53 (*)    All other components within normal limits  AMMONIA - Abnormal; Notable for the following components:   Ammonia 80 (*)    All other components within normal limits  LACTIC ACID, PLASMA - Abnormal; Notable for the following components:   Lactic Acid, Venous 2.0 (*)    All other components within normal limits  URINALYSIS, MICROSCOPIC (REFLEX) - Abnormal; Notable for the following components:   Bacteria, UA RARE (*)    All other components within normal limits  LIPASE, BLOOD  LACTIC ACID, PLASMA    EKG None  Radiology CT HEAD WO CONTRAST (5MM)  Result Date: 09/19/2021 CLINICAL DATA:  Head trauma, minor (Age >= 65y) head injury Trauma. Fall off hammock.  Right upper extremity pain. EXAM: CT HEAD WITHOUT CONTRAST TECHNIQUE: Contiguous axial images were obtained from the base of the skull through the vertex without intravenous contrast. COMPARISON:  None. FINDINGS: Brain: No acute hemorrhage. No subdural or extra-axial collection.  Generalized atrophy is normal for age. There is advanced periventricular and deep white matter hypodensity, nonspecific but typically chronic small vessel ischemia. No evidence of acute infarct. No hydrocephalus, midline shift, or mass effect. Incidental mega cisterna magna. Vascular: Atherosclerosis of skullbase vasculature without hyperdense vessel or abnormal calcification. Skull: No fracture or focal lesion. Sinuses/Orbits: No acute  facial bone fracture. Left nasal bone fracture appears remote. Scattered mucosal thickening of ethmoid air cells. No mastoid effusion. Left cataract resection. Other: No confluent scalp contusion. IMPRESSION: 1. No acute intracranial abnormality. No skull fracture. 2. Advanced chronic small vessel ischemia. Electronically Signed   By: Keith Rake M.D.   On: 09/19/2021 20:09   CT Cervical Spine Wo Contrast  Result Date: 09/19/2021 CLINICAL DATA:  Neck trauma (Age >= 65y) fall Fall today striking head. EXAM: CT CERVICAL SPINE WITHOUT CONTRAST TECHNIQUE: Multidetector CT imaging of the cervical spine was performed without intravenous contrast. Multiplanar CT image reconstructions were also generated. COMPARISON:  None. FINDINGS: Alignment: No traumatic subluxation straightening of normal lordosis. Trace anterolisthesis of C4 on C5 and retrolisthesis of C6 on C7. Skull base and vertebrae: No acute fracture. Dens and skull base are intact. There is bony fusion of C1 in the occiput. Non fusion posterior elements of C1. Soft tissues and spinal canal: No prevertebral fluid or swelling. No visible canal hematoma. Disc levels: Degenerative disc disease at C2-C3, C5-C6 and C6-C7. Scattered facet hypertrophy. Mild disc bulge at C3-C4. No high-grade canal stenosis. Upper chest: Biapical pleuroparenchymal scarring. Emphysema. Calcified granuloma. No acute findings. Other: Carotid calcifications. IMPRESSION: Degenerative change in the cervical spine without acute fracture or subluxation.  Electronically Signed   By: Keith Rake M.D.   On: 09/19/2021 20:06   CT ABDOMEN PELVIS W CONTRAST  Result Date: 09/19/2021 CLINICAL DATA:  Acute abdominal pain.  Fall today. EXAM: CT ABDOMEN AND PELVIS WITH CONTRAST TECHNIQUE: Multidetector CT imaging of the abdomen and pelvis was performed using the standard protocol following bolus administration of intravenous contrast. CONTRAST:  65m OMNIPAQUE IOHEXOL 350 MG/ML SOLN COMPARISON:  None. FINDINGS: Lower chest: Tree-in-bud opacities in the left lower lobe. Coronary artery calcifications, normal heart size. Paraesophageal varices. Hepatobiliary: Cirrhotic hepatic morphology with nodular hepatic contours. No evidence of focal hepatic lesion. Peripheral calcification in the right hepatic lobe. Intraluminal gallstones without pericholecystic inflammation. Marked dilatation of the left portal vein with large recannulated umbilical vein and abdominal collaterals. Pancreas: No ductal dilatation or inflammation. Spleen: Splenomegaly. The spleen spans 14.2 x 10.1 x 10.1 cm (volume = 758 cm^3). No focal splenic abnormality. Adrenals/Urinary Tract: No adrenal nodule. Nonobstructing stones in the lower right kidney. Punctate nonobstructing stone in the upper right kidney. No hydronephrosis. Homogeneous renal enhancement. Small cyst in the left kidney. Diminished excretion on delayed phase imaging. Distended urinary bladder with mild wall thickening. Left lateral bladder diverticulum. No perivesicular fat stranding. Stomach/Bowel: Paraesophageal and perigastric varices. Decompressed stomach. There is wall thickening of the cecum and ascending colon. Moderate colonic stool burden. No bowel obstruction. Normal appendix. Vascular/Lymphatic: Dilated but patent portal vein. Dilated left portal vein with large recannulated umbilical vein. Large varices extend into umbilical hernia. Prominent portosystemic collaterals in the abdomen and pelvis. Advanced aortic and branch  atherosclerosis. No aortic aneurysm. No bulky abdominopelvic adenopathy. Reproductive: Prostate is unremarkable. Other: No ascites. No free air. Upper abdominal ventral abdominal hernia contains collaterals. Umbilical hernia contains collaterals. Musculoskeletal: No acute fracture of the lumbar spine or pelvis. No suspicious bone abnormality. IMPRESSION: 1. Cirrhosis with portal hypertension, splenomegaly, and prominent portosystemic collaterals. No ascites. 2. Mild wall thickening of the cecum and ascending colon. This is a typical location for portal colopathy, infectious or inflammatory colitis is not excluded. 3. Distended urinary bladder with mild wall thickening. Left bladder diverticulum. Suspect chronic bladder outlet obstruction, however recommend correlation with urinalysis to exclude urinary tract infection. 4. Cholelithiasis without  gallbladder inflammation. 5. Nonobstructing right nephrolithiasis. 6. Tree-in-bud opacities in the left lower lobe, likely infectious or inflammatory, recommend correlation for aspiration risk factors. Aortic Atherosclerosis (ICD10-I70.0). Electronically Signed   By: Keith Rake M.D.   On: 09/19/2021 20:16    Procedures Procedures   Medications Ordered in ED Medications  lactulose (CHRONULAC) 10 GM/15ML solution 10 g (has no administration in time range)  cefTRIAXone (ROCEPHIN) 1 g in sodium chloride 0.9 % 100 mL IVPB (has no administration in time range)  iohexol (OMNIPAQUE) 350 MG/ML injection 80 mL (80 mLs Intravenous Contrast Given 09/19/21 1927)  sodium chloride 0.9 % bolus 500 mL (0 mLs Intravenous Stopped 09/19/21 2052)  ibuprofen (ADVIL) tablet 800 mg (800 mg Oral Given 09/19/21 2049)    ED Course  I have reviewed the triage vital signs and the nursing notes.  Pertinent labs & imaging results that were available during my care of the patient were reviewed by me and considered in my medical decision making (see chart for details).  Clinical Course  as of 09/21/21 1320  Tue Sep 19, 2021  1828 I discussed this case with my attending physician who cosigned this note including patient's presenting symptoms, physical exam, and planned diagnostics and interventions. Attending physician stated agreement with plan or made changes to plan which were implemented.   Attending physician assessed patient at bedside.   [CF]  2114 On reassessment, patient is feeling okay.  Patient has asterixis on repeat physical exam. [CF]  2148 Spoke with Dr. Roel Cluck. She agrees to admit the patient. Will start him on azithromycin and get a chest xray to evaluate for possible pneumonia and a bladder scan to evaluate for retention. [CF]    Clinical Course User Index [CF] Cherrie Gauze   MDM Rules/Calculators/A&P                          Johnny Dunlap is a 71 y.o. male with history of cirrhosis and dementia who [facility] for evaluation of decreased cognitive function and frequent falls from his facility.  History was limited secondary to his baseline mental status.  CBC without leukocytosis and chronic ongoing anemia.  CMP reveals elevated alk phos and bilirubin.  Likely due to his cirrhosis.  UA revealed UTI.  Lactic acid was elevated.  Resuscitated with 500 normal saline.  Likely elevated secondary to lack of padded clearance.  Imaging of the cervical spine and head revealed no intracranial pathology or fracture or dislocation of the spine.  CT abdomen did not reveal any ascites.  A low suspicion for SBP at this time.  Ammonia was elevated.  Given the clinical scenario, his acute cognitive decline is likely due to urinary tract infection with overlying hepatic encephalopathy.  Ceftriaxone started for UTI.  Lactulose started for hepatic encephalopathy.  At this time I feel acute benefit for further evaluation in the hospital.  Will admit to hospitalist service.   Final Clinical Impression(s) / ED Diagnoses Final diagnoses:   Urinary tract infection without hematuria, site unspecified  Altered mental status, unspecified altered mental status type    Rx / DC Orders ED Discharge Orders     None        Cherrie Gauze 09/21/21 1322    Regan Lemming, MD 09/21/21 1323

## 2021-09-20 ENCOUNTER — Encounter (HOSPITAL_COMMUNITY): Payer: Self-pay | Admitting: Internal Medicine

## 2021-09-20 DIAGNOSIS — N390 Urinary tract infection, site not specified: Secondary | ICD-10-CM | POA: Diagnosis not present

## 2021-09-20 DIAGNOSIS — J189 Pneumonia, unspecified organism: Secondary | ICD-10-CM | POA: Diagnosis not present

## 2021-09-20 DIAGNOSIS — K7200 Acute and subacute hepatic failure without coma: Secondary | ICD-10-CM | POA: Diagnosis not present

## 2021-09-20 DIAGNOSIS — Z7189 Other specified counseling: Secondary | ICD-10-CM | POA: Diagnosis not present

## 2021-09-20 DIAGNOSIS — R4182 Altered mental status, unspecified: Secondary | ICD-10-CM | POA: Diagnosis not present

## 2021-09-20 DIAGNOSIS — R338 Other retention of urine: Secondary | ICD-10-CM | POA: Diagnosis not present

## 2021-09-20 DIAGNOSIS — Z515 Encounter for palliative care: Secondary | ICD-10-CM

## 2021-09-20 LAB — CBC WITH DIFFERENTIAL/PLATELET
Abs Immature Granulocytes: 0.02 10*3/uL (ref 0.00–0.07)
Basophils Absolute: 0 10*3/uL (ref 0.0–0.1)
Basophils Relative: 1 %
Eosinophils Absolute: 0.2 10*3/uL (ref 0.0–0.5)
Eosinophils Relative: 6 %
HCT: 26.6 % — ABNORMAL LOW (ref 39.0–52.0)
Hemoglobin: 8.7 g/dL — ABNORMAL LOW (ref 13.0–17.0)
Immature Granulocytes: 1 %
Lymphocytes Relative: 15 %
Lymphs Abs: 0.6 10*3/uL — ABNORMAL LOW (ref 0.7–4.0)
MCH: 32.3 pg (ref 26.0–34.0)
MCHC: 32.7 g/dL (ref 30.0–36.0)
MCV: 98.9 fL (ref 80.0–100.0)
Monocytes Absolute: 0.5 10*3/uL (ref 0.1–1.0)
Monocytes Relative: 14 %
Neutro Abs: 2.4 10*3/uL (ref 1.7–7.7)
Neutrophils Relative %: 63 %
Platelets: 47 10*3/uL — ABNORMAL LOW (ref 150–400)
RBC: 2.69 MIL/uL — ABNORMAL LOW (ref 4.22–5.81)
RDW: 19.3 % — ABNORMAL HIGH (ref 11.5–15.5)
WBC: 3.8 10*3/uL — ABNORMAL LOW (ref 4.0–10.5)
nRBC: 0 % (ref 0.0–0.2)

## 2021-09-20 LAB — COMPREHENSIVE METABOLIC PANEL
ALT: 21 U/L (ref 0–44)
AST: 37 U/L (ref 15–41)
Albumin: 2.6 g/dL — ABNORMAL LOW (ref 3.5–5.0)
Alkaline Phosphatase: 129 U/L — ABNORMAL HIGH (ref 38–126)
Anion gap: 8 (ref 5–15)
BUN: 29 mg/dL — ABNORMAL HIGH (ref 8–23)
CO2: 22 mmol/L (ref 22–32)
Calcium: 8.9 mg/dL (ref 8.9–10.3)
Chloride: 113 mmol/L — ABNORMAL HIGH (ref 98–111)
Creatinine, Ser: 1.1 mg/dL (ref 0.61–1.24)
GFR, Estimated: >60 mL/min (ref >60)
Glucose, Bld: 121 mg/dL — ABNORMAL HIGH (ref 70–99)
Potassium: 3.8 mmol/L (ref 3.5–5.1)
Sodium: 143 mmol/L (ref 135–145)
Total Bilirubin: 3.1 mg/dL — ABNORMAL HIGH (ref 0.3–1.2)
Total Protein: 6.2 g/dL — ABNORMAL LOW (ref 6.5–8.1)

## 2021-09-20 LAB — CK: Total CK: 59 U/L (ref 49–397)

## 2021-09-20 LAB — LACTIC ACID, PLASMA
Lactic Acid, Venous: 2.9 mmol/L (ref 0.5–1.9)
Lactic Acid, Venous: 2.5 mmol/L (ref 0.5–1.9)

## 2021-09-20 LAB — MAGNESIUM
Magnesium: 2 mg/dL (ref 1.7–2.4)
Magnesium: 1.9 mg/dL (ref 1.7–2.4)

## 2021-09-20 LAB — TSH: TSH: 3.002 u[IU]/mL (ref 0.350–4.500)

## 2021-09-20 LAB — STREP PNEUMONIAE URINARY ANTIGEN: Strep Pneumo Urinary Antigen: NEGATIVE

## 2021-09-20 LAB — AMMONIA: Ammonia: 178 umol/L — ABNORMAL HIGH (ref 9–35)

## 2021-09-20 LAB — PHOSPHORUS
Phosphorus: 3.1 mg/dL (ref 2.5–4.6)
Phosphorus: 3.3 mg/dL (ref 2.5–4.6)

## 2021-09-20 LAB — MRSA NEXT GEN BY PCR, NASAL: MRSA by PCR Next Gen: DETECTED — AB

## 2021-09-20 LAB — HIV ANTIBODY (ROUTINE TESTING W REFLEX): HIV Screen 4th Generation wRfx: NONREACTIVE

## 2021-09-20 LAB — PROCALCITONIN: Procalcitonin: 0.28 ng/mL

## 2021-09-20 MED ORDER — LACTULOSE 10 GM/15ML PO SOLN
30.0000 g | Freq: Three times a day (TID) | ORAL | Status: DC
  Administered 2021-09-20 – 2021-09-22 (×8): 30 g via ORAL
  Filled 2021-09-20 (×10): qty 60

## 2021-09-20 MED ORDER — FOLIC ACID 1 MG PO TABS
1.0000 mg | ORAL_TABLET | Freq: Every day | ORAL | Status: DC
  Administered 2021-09-20 – 2021-09-23 (×4): 1 mg via ORAL
  Filled 2021-09-20 (×4): qty 1

## 2021-09-20 MED ORDER — MIDODRINE HCL 5 MG PO TABS
5.0000 mg | ORAL_TABLET | Freq: Three times a day (TID) | ORAL | Status: DC
  Administered 2021-09-20 – 2021-09-23 (×11): 5 mg via ORAL
  Filled 2021-09-20 (×10): qty 1

## 2021-09-20 MED ORDER — BISACODYL 5 MG PO TBEC
5.0000 mg | DELAYED_RELEASE_TABLET | Freq: Once | ORAL | Status: AC
  Administered 2021-09-20: 5 mg via ORAL
  Filled 2021-09-20: qty 1

## 2021-09-20 MED ORDER — SODIUM CHLORIDE 0.9% FLUSH: 3.0000 mL | INTRAVENOUS | Status: DC | PRN

## 2021-09-20 MED ORDER — SODIUM CHLORIDE 0.9 % IV SOLN: 250.0000 mL | INTRAVENOUS | Status: DC | PRN

## 2021-09-20 MED ORDER — SODIUM CHLORIDE 0.9 % IV BOLUS
500.0000 mL | Freq: Once | INTRAVENOUS | Status: AC
  Administered 2021-09-20: 500 mL via INTRAVENOUS

## 2021-09-20 MED ORDER — SODIUM CHLORIDE 0.9% FLUSH
3.0000 mL | Freq: Two times a day (BID) | INTRAVENOUS | Status: DC
  Administered 2021-09-20 – 2021-09-23 (×8): 3 mL via INTRAVENOUS

## 2021-09-20 MED ORDER — ENSURE ENLIVE PO LIQD
237.0000 mL | Freq: Three times a day (TID) | ORAL | Status: DC
  Administered 2021-09-20 – 2021-09-23 (×10): 237 mL via ORAL

## 2021-09-20 MED ORDER — THIAMINE HCL 100 MG/ML IJ SOLN
100.0000 mg | Freq: Every day | INTRAMUSCULAR | Status: DC
  Administered 2021-09-20: 100 mg via INTRAVENOUS
  Filled 2021-09-20: qty 2

## 2021-09-20 MED ORDER — CHLORHEXIDINE GLUCONATE CLOTH 2 % EX PADS
6.0000 | MEDICATED_PAD | Freq: Every day | CUTANEOUS | Status: DC
  Administered 2021-09-20 – 2021-09-23 (×4): 6 via TOPICAL

## 2021-09-20 MED ORDER — CHLORHEXIDINE GLUCONATE CLOTH 2 % EX PADS: 6.0000 | MEDICATED_PAD | Freq: Every day | CUTANEOUS | Status: DC

## 2021-09-20 MED ORDER — MUPIROCIN 2 % EX OINT
1.0000 "application " | TOPICAL_OINTMENT | Freq: Two times a day (BID) | CUTANEOUS | Status: DC
  Administered 2021-09-20 – 2021-09-23 (×8): 1 via NASAL
  Filled 2021-09-20: qty 22

## 2021-09-20 MED ORDER — ONDANSETRON HCL 4 MG/2ML IJ SOLN
4.0000 mg | Freq: Once | INTRAMUSCULAR | Status: AC
  Administered 2021-09-20: 4 mg via INTRAVENOUS
  Filled 2021-09-20: qty 2

## 2021-09-20 NOTE — ED Notes (Signed)
ED TO INPATIENT HANDOFF REPORT  Name/Age/Gender Johnny Dunlap 71 y.o. male  Code Status    Code Status Orders  (From admission, onward)           Start     Ordered   09/20/21 0008  Do not attempt resuscitation (DNR)  Continuous       Question Answer Comment  In the event of cardiac or respiratory ARREST Do not call a "code blue"   In the event of cardiac or respiratory ARREST Do not perform Intubation, CPR, defibrillation or ACLS   In the event of cardiac or respiratory ARREST Use medication by any route, position, wound care, and other measures to relive pain and suffering. May use oxygen, suction and manual treatment of airway obstruction as needed for comfort.   Comments nurse may pronounce      09/20/21 0007           Code Status History     Date Active Date Inactive Code Status Order ID Comments User Context   08/01/2021 1807 08/15/2021 1916 DNR [id number]  Alford Highland, MD ED   07/23/2021 1820 07/24/2021 2059 Full Code [id number]  Verdia Kuba, DO ED      Advance Directive Documentation    Flowsheet Row Most Recent Value  Type of Advance Directive Out of facility DNR (pink MOST or yellow form)  Pre-existing out of facility DNR order (yellow form or pink MOST form) --  "MOST" Form in Place? --       Home/SNF/Other Nursing Home  Chief Complaint Acute hepatic encephalopathy [K72.00]  Level of Care/Admitting Diagnosis ED Disposition     ED Disposition  Admit   Condition  --   Comment  Hospital Area: [facility] [[id number]]  Level of Care: Progressive [102]  Admit to Progressive based on following criteria: NEUROLOGICAL AND NEUROSURGICAL complex patients with significant risk of instability, who do not meet ICU criteria, yet require close observation or frequent assessment (< / = every 2 - 4 hours) with medical / nursing intervention.  Admit to Progressive based on following criteria: MULTISYSTEM THREATS such as stable sepsis,  metabolic/electrolyte imbalance with or without encephalopathy that is responding to early treatment.  May admit patient to [facility] or [facility] if equivalent level of care is available:: No  Covid Evaluation: Recent COVID positive no isolation required infection day 21-90  Diagnosis: Acute hepatic encephalopathy [724072]  Admitting Physician: Therisa Doyne [[id number]]  Attending Physician: Therisa Doyne [[id number]]  Estimated length of stay: past midnight tomorrow  Certification:: I certify this patient will need inpatient services for at least 2 midnights          Medical History Past Medical History:  Diagnosis Date   Cirrhosis (HCC)    COPD (chronic obstructive pulmonary disease) (HCC)     Allergies No Known Allergies  IV Location/Drains/Wounds Patient Lines/Drains/Airways Status     Active Line/Drains/Airways     Name Placement date Placement time Site Days   Peripheral IV 09/19/21 20 G Left Antecubital 09/19/21  1818  Antecubital  1   Urethral Catheter Adora Fridge, RN Non-latex 14 Fr. 09/19/21  2324  Non-latex  1            Labs/Imaging Results for orders placed or performed during the hospital encounter of 09/19/21 (from the past 48 hour(s))  Lipase, blood     Status: None   Collection Time: 09/19/21  6:17 PM  Result Value Ref Range   Lipase 38 11 -  51 U/L    Comment: Performed at [facility], [address], [place], [place] [address]  Comprehensive metabolic panel     Status: Abnormal   Collection Time: 09/19/21  6:17 PM  Result Value Ref Range   Sodium 142 135 - 145 mmol/L   Potassium 4.0 3.5 - 5.1 mmol/L   Chloride 113 (H) 98 - 111 mmol/L   CO2 22 22 - 32 mmol/L   Glucose, Bld 100 (H) 70 - 99 mg/dL    Comment: Glucose reference range applies only to samples taken after fasting for at least 8 hours.   BUN 25 (H) 8 - 23 mg/dL   Creatinine, Ser 6.04 0.61 - 1.24 mg/dL   Calcium 9.4 8.9 - 54.0 mg/dL   Total Protein 7.0 6.5 -  8.1 g/dL   Albumin 2.9 (L) 3.5 - 5.0 g/dL   AST 41 15 - 41 U/L   ALT 22 0 - 44 U/L   Alkaline Phosphatase 161 (H) 38 - 126 U/L   Total Bilirubin 3.9 (H) 0.3 - 1.2 mg/dL   GFR, Estimated >98 >11 mL/min    Comment: (NOTE) Calculated using the CKD-EPI Creatinine Equation (2021)    Anion gap 7 5 - 15    Comment: Performed at [facility], [address], [place], [place] [address]  CBC with Differential     Status: Abnormal   Collection Time: 09/19/21  6:17 PM  Result Value Ref Range   WBC 4.4 4.0 - 10.5 K/uL   RBC 2.98 (L) 4.22 - 5.81 MIL/uL   Hemoglobin 9.6 (L) 13.0 - 17.0 g/dL   HCT 29.5 (L) 62.1 - 30.8 %   MCV 99.3 80.0 - 100.0 fL   MCH 32.2 26.0 - 34.0 pg   MCHC 32.4 30.0 - 36.0 g/dL   RDW 65.7 (H) 84.6 - 96.2 %   Platelets 53 (L) 150 - 400 K/uL    Comment: SPECIMEN CHECKED FOR CLOTS Immature Platelet Fraction may be clinically indicated, consider ordering this additional test [id number] REPEATED TO VERIFY PLATELET COUNT CONFIRMED BY SMEAR    nRBC 0.0 0.0 - 0.2 %   Neutrophils Relative % 63 %   Neutro Abs 2.8 1.7 - 7.7 K/uL   Lymphocytes Relative 19 %   Lymphs Abs 0.9 0.7 - 4.0 K/uL   Monocytes Relative 10 %   Monocytes Absolute 0.4 0.1 - 1.0 K/uL   Eosinophils Relative 6 %   Eosinophils Absolute 0.3 0.0 - 0.5 K/uL   Basophils Relative 1 %   Basophils Absolute 0.1 0.0 - 0.1 K/uL   Immature Granulocytes 1 %   Abs Immature Granulocytes 0.02 0.00 - 0.07 K/uL   Ovalocytes PRESENT     Comment: Performed at [facility], [address] [place], [place] [address]  Ammonia     Status: Abnormal   Collection Time: 09/19/21  6:17 PM  Result Value Ref Range   Ammonia 80 (H) 9 - 35 umol/L    Comment: Performed at [facility], [address] [place], [place] [address]  Lactic acid, plasma     Status: Abnormal   Collection Time: 09/19/21  6:17 PM  Result Value Ref Range   Lactic Acid, Venous 2.0 (HH) 0.5 - 1.9 mmol/L     Comment: CRITICAL RESULT CALLED TO, READ BACK BY AND VERIFIED WITH: Everet Flagg,J. RN  ON 09/19/2021 BY COHEN,K Performed at [facility], [address], [place], [place] [address]  Urinalysis, Routine w reflex microscopic Urine, Clean Catch     Status: Abnormal   Collection Time: 09/19/21  7:19 PM  Result Value Ref Range   Color, Urine YELLOW YELLOW   APPearance CLEAR CLEAR   Specific Gravity, Urine 1.015 1.005 - 1.030   pH 7.5 5.0 - 8.0   Glucose, UA NEGATIVE NEGATIVE mg/dL   Hgb urine dipstick LARGE (A) NEGATIVE   Bilirubin Urine NEGATIVE NEGATIVE   Ketones, ur NEGATIVE NEGATIVE mg/dL   Protein, ur NEGATIVE NEGATIVE mg/dL   Nitrite POSITIVE (A) NEGATIVE   Leukocytes,Ua SMALL (A) NEGATIVE    Comment: Performed at [facility], [address] [place], [place] [address]  Urinalysis, Microscopic (reflex)     Status: Abnormal   Collection Time: 09/19/21  7:19 PM  Result Value Ref Range   RBC / HPF >50 0 - 5 RBC/hpf   WBC, UA 11-20 0 - 5 WBC/hpf   Bacteria, UA RARE (A) NONE SEEN   Squamous Epithelial / LPF NONE SEEN 0 - 5   Mucus PRESENT     Comment: Performed at [facility], [address], [place], [place] [address]  Lactic acid, plasma     Status: Abnormal   Collection Time: 09/19/21  8:52 PM  Result Value Ref Range   Lactic Acid, Venous 2.1 (HH) 0.5 - 1.9 mmol/L    Comment: CRITICAL VALUE NOTED.  VALUE IS CONSISTENT WITH PREVIOUSLY REPORTED AND CALLED VALUE. Performed at [facility], [address], [place], [place] [address]   Blood gas, venous     Status: Abnormal   Collection Time: 09/19/21 10:48 PM  Result Value Ref Range   pH, Ven 7.405 7.250 - 7.430   pCO2, Ven 34.5 (L) 44.0 - 60.0 mmHg   pO2, Ven 34.2 32.0 - 45.0 mmHg   Bicarbonate 21.2 20.0 - 28.0 mmol/L   Acid-base deficit 2.5 (H) 0.0 - 2.0 mmol/L   O2 Saturation 47.9 %   Patient temperature 98.6     Comment: Performed at [facility], [address], [place], [place] [address]  Protime-INR     Status: Abnormal   Collection Time: 09/19/21 10:48 PM  Result Value Ref Range   Prothrombin Time 19.8 (H) 11.4 - 15.2 seconds   INR 1.7 (H) 0.8 - 1.2    Comment: (NOTE) INR goal varies based on device and disease states. Performed at [facility], [address] [place], [place] [address]    DG Chest 2 View  Result Date: 09/19/2021 CLINICAL DATA:  Status post fall. EXAM: CHEST - 2 VIEW COMPARISON:  August 01, 2021 FINDINGS: Chronic appearing increased lung markings are seen without evidence of acute infiltrate, pleural effusion or pneumothorax. The heart size and mediastinal contours are within normal limits. The visualized skeletal structures are unremarkable. IMPRESSION: No active cardiopulmonary disease. Electronically Signed   By: Aram Candela M.D.   On: 09/19/2021 22:15   CT HEAD WO CONTRAST ( )  Result Date: 09/19/2021 CLINICAL DATA:  Head trauma, minor (Age >= 65y) head injury Trauma. Fall off hammock.  Right upper extremity pain. EXAM: CT HEAD WITHOUT CONTRAST TECHNIQUE: Contiguous axial images were obtained from the base of the skull through the vertex without intravenous contrast. COMPARISON:  None. FINDINGS: Brain: No acute hemorrhage. No subdural or extra-axial collection. Generalized atrophy is normal for age. There is advanced periventricular and deep white matter hypodensity, nonspecific but typically chronic small vessel ischemia. No evidence of acute  infarct. No hydrocephalus, midline shift, or mass effect. Incidental mega cisterna magna. Vascular: Atherosclerosis of skullbase vasculature without hyperdense vessel or abnormal calcification. Skull: No fracture or focal lesion. Sinuses/Orbits: No acute facial bone fracture. Left nasal bone fracture appears remote. Scattered mucosal thickening of ethmoid air cells. No mastoid effusion. Left cataract resection. Other: No  confluent scalp contusion. IMPRESSION: 1. No acute intracranial abnormality. No skull fracture. 2. Advanced chronic small vessel ischemia. Electronically Signed   By: Narda Rutherford M.D.   On: 09/19/2021 20:09   CT Cervical Spine Wo Contrast  Result Date: 09/19/2021 CLINICAL DATA:  Neck trauma (Age >= 65y) fall Fall today striking head. EXAM: CT CERVICAL SPINE WITHOUT CONTRAST TECHNIQUE: Multidetector CT imaging of the cervical spine was performed without intravenous contrast. Multiplanar CT image reconstructions were also generated. COMPARISON:  None. FINDINGS: Alignment: No traumatic subluxation straightening of normal lordosis. Trace anterolisthesis of C4 on C5 and retrolisthesis of C6 on C7. Skull base and vertebrae: No acute fracture. Dens and skull base are intact. There is bony fusion of C1 in the occiput. Non fusion posterior elements of C1. Soft tissues and spinal canal: No prevertebral fluid or swelling. No visible canal hematoma. Disc levels: Degenerative disc disease at C2-C3, C5-C6 and C6-C7. Scattered facet hypertrophy. Mild disc bulge at C3-C4. No high-grade canal stenosis. Upper chest: Biapical pleuroparenchymal scarring. Emphysema. Calcified granuloma. No acute findings. Other: Carotid calcifications. IMPRESSION: Degenerative change in the cervical spine without acute fracture or subluxation. Electronically Signed   By: Narda Rutherford M.D.   On: 09/19/2021 20:06   CT ABDOMEN PELVIS W CONTRAST  Result Date: 09/19/2021 CLINICAL DATA:  Acute abdominal pain.  Fall today. EXAM: CT ABDOMEN AND PELVIS WITH CONTRAST TECHNIQUE: Multidetector CT imaging of the abdomen and pelvis was performed using the standard protocol following bolus administration of intravenous contrast. CONTRAST:  46mL OMNIPAQUE IOHEXOL 350 MG/ML SOLN COMPARISON:  None. FINDINGS: Lower chest: Tree-in-bud opacities in the left lower lobe. Coronary artery calcifications, normal heart size. Paraesophageal varices. Hepatobiliary:  Cirrhotic hepatic morphology with nodular hepatic contours. No evidence of focal hepatic lesion. Peripheral calcification in the right hepatic lobe. Intraluminal gallstones without pericholecystic inflammation. Marked dilatation of the left portal vein with large recannulated umbilical vein and abdominal collaterals. Pancreas: No ductal dilatation or inflammation. Spleen: Splenomegaly. The spleen spans 14.2 x 10.1 x 10.1 cm (volume = 758 cm^3). No focal splenic abnormality. Adrenals/Urinary Tract: No adrenal nodule. Nonobstructing stones in the lower right kidney. Punctate nonobstructing stone in the upper right kidney. No hydronephrosis. Homogeneous renal enhancement. Small cyst in the left kidney. Diminished excretion on delayed phase imaging. Distended urinary bladder with mild wall thickening. Left lateral bladder diverticulum. No perivesicular fat stranding. Stomach/Bowel: Paraesophageal and perigastric varices. Decompressed stomach. There is wall thickening of the cecum and ascending colon. Moderate colonic stool burden. No bowel obstruction. Normal appendix. Vascular/Lymphatic: Dilated but patent portal vein. Dilated left portal vein with large recannulated umbilical vein. Large varices extend into umbilical hernia. Prominent portosystemic collaterals in the abdomen and pelvis. Advanced aortic and branch atherosclerosis. No aortic aneurysm. No bulky abdominopelvic adenopathy. Reproductive: Prostate is unremarkable. Other: No ascites. No free air. Upper abdominal ventral abdominal hernia contains collaterals. Umbilical hernia contains collaterals. Musculoskeletal: No acute fracture of the lumbar spine or pelvis. No suspicious bone abnormality. IMPRESSION: 1. Cirrhosis with portal hypertension, splenomegaly, and prominent portosystemic collaterals. No ascites. 2. Mild wall thickening of the cecum and ascending colon. This is a typical location for portal colopathy, infectious or inflammatory colitis  is not  excluded. 3. Distended urinary bladder with mild wall thickening. Left bladder diverticulum. Suspect chronic bladder outlet obstruction, however recommend correlation with urinalysis to exclude urinary tract infection. 4. Cholelithiasis without gallbladder inflammation. 5. Nonobstructing right nephrolithiasis. 6. Tree-in-bud opacities in the left lower lobe, likely infectious or inflammatory, recommend correlation for aspiration risk factors. Aortic Atherosclerosis (ICD10-I70.0). Electronically Signed   By: Narda Rutherford M.D.   On: 09/19/2021 20:16    Pending Labs Unresulted Labs (From admission, onward)     Start     Ordered   09/20/21 0000  Lactic acid, plasma  STAT Now then every 3 hours,   STAT      09/19/21 2158   09/19/21 2315  CK  Once,   STAT        09/19/21 2315   09/19/21 2315  Magnesium  Once,   STAT        09/19/21 2315   09/19/21 2315  Procalcitonin  Once,   STAT        09/19/21 2315   09/19/21 2315  Phosphorus  Once,   STAT        09/19/21 2315   09/19/21 2208  Urine Culture  Once,   STAT       Question:  Indication  Answer:  Sepsis   09/19/21 2207   09/19/21 2155  Culture, blood (routine x 2) Call MD if unable to obtain prior to antibiotics being given  BLOOD CULTURE X 2,   R (with STAT occurrences)     Comments: If blood cultures drawn in Emergency Department - Do not draw and cancel order    09/19/21 2158   09/19/21 2155  Expectorated Sputum Assessment w Gram Stain, Rflx to Resp Cult  Once,   R        09/19/21 2158   09/19/21 2155  Legionella Pneumophila Serogp 1 Ur Ag  Once,   STAT        09/19/21 2158   09/19/21 2155  Strep pneumoniae urinary antigen  Once,   STAT        09/19/21 2158   09/19/21 2154  HIV Antibody (routine testing w rflx)  (HIV Antibody (Routine testing w reflex) panel)  Once,   STAT        09/19/21 2158   Signed and Held  Magnesium  Tomorrow morning,   R        Signed and Held   Signed and Held  Phosphorus  Tomorrow morning,   R        Signed  and Held   Signed and Held  CBC WITH DIFFERENTIAL  Tomorrow morning,   R        Signed and Held   Signed and Held  TSH  Tomorrow morning,   R        Signed and Held   Signed and Held  Comprehensive metabolic panel  Tomorrow morning,   R        Signed and Held   Signed and Held  Ammonia  Tomorrow morning,   STAT        Signed and Held            Vitals/Pain Today's Vitals   09/19/21 2009 09/19/21 2100 09/19/21 2130 09/20/21 0030  BP: (!) 111/50 (!) 108/55 (!) 103/54 124/74  Pulse: 74 (!) 51 (!) 59 76  Resp: 12 12 15 16   Temp:   98.1 F (36.7 C)   TempSrc:   Oral  SpO2: 100% 100% 100% 100%  PainSc:   3      Isolation Precautions No active isolations  Medications Medications  lactulose (CHRONULAC) 10 GM/15ML solution 10 g (10 g Oral Given 09/19/21 2138)  cefTRIAXone (ROCEPHIN) 2 g in sodium chloride 0.9 % 100 mL IVPB (has no administration in time range)  azithromycin (ZITHROMAX) 500 mg in sodium chloride 0.9 % 250 mL IVPB (has no administration in time range)  iohexol (OMNIPAQUE) 350 MG/ML injection 80 mL (80 mLs Intravenous Contrast Given 09/19/21 1927)  sodium chloride 0.9 % bolus 500 mL (0 mLs Intravenous Stopped 09/19/21 2052)  ibuprofen (ADVIL) tablet 800 mg (800 mg Oral Given 09/19/21 2049)  cefTRIAXone (ROCEPHIN) 1 g in sodium chloride 0.9 % 100 mL IVPB (0 g Intravenous Stopped 09/19/21 2228)  azithromycin (ZITHROMAX) 500 mg in sodium chloride 0.9 % 250 mL IVPB (0 mg Intravenous Stopped 09/20/21 0026)    Mobility walks with device

## 2021-09-20 NOTE — Evaluation (Signed)
Occupational Therapy Evaluation Patient Details Name: Johnny Dunlap MRN: [id number] DOB: June 15, 1950 Today's Date: 09/20/2021   History of Present Illness Johnny Dunlap is a 71 y.o. male presents due to fall. Pt from [facility], more confused per staff. CT no acute intracranial abnormality; no skull fracture. PMG: liver cirrhosis, COPD, dementia, severe malnutrition   Clinical Impression   Mr. Johnny Dunlap is a 71 year old man who presents with impaired balance, decreased activity tolerance, and generalized weakness. Of note patient has history of neuropathy in bilateral feet and low vision from cataracts resulting in making him a high fall risk. Patient's BP dropped from 114/71 to 91/58 with standing. Overall patient min assist for mobility needing steadying assist and walker management and predominantly with ADLs except for max assist for toileting. Patient has a history of dementia and though he is alert to self, year and month isn't able to speak definitively on his medical situation or describe how is feeling as he drifts back and forth in standing. Patient will benefit from skilled OT services while in hospital to improve deficits and learn compensatory strategies as needed in order to improve safety and functional abilities.  Recommend return to facility.      Recommendations for follow up therapy are one component of a multi-disciplinary discharge planning process, led by the attending physician.  Recommendations may be updated based on patient status, additional functional criteria and insurance authorization.   Follow Up Recommendations  SNF    Equipment Recommendations  None recommended by OT    Recommendations for Other Services       Precautions / Restrictions Precautions Precautions: Fall Restrictions Weight Bearing Restrictions: No      Mobility Bed Mobility Overal bed mobility: Needs Assistance Bed Mobility: Supine to Sit     Supine to sit: Supervision           Transfers Overall transfer level: Needs assistance Equipment used: Rolling walker (2 wheeled) Transfers: Sit to/from [employer] Sit to Stand: Min assist Stand pivot transfers: Min assist       General transfer comment: MIn assist for steadying and walker management.    Balance Overall balance assessment: Needs assistance Sitting-balance support: No upper extremity supported Sitting balance-Leahy Scale: Good     Standing balance support: Bilateral upper extremity supported Standing balance-Leahy Scale: Poor Standing balance comment: reliant on external assistance                           ADL either performed or assessed with clinical judgement   ADL Overall ADL's : Needs assistance/impaired Eating/Feeding: Set up;Sitting   Grooming: Set up;Sitting   Upper Body Bathing: Set up;Sitting   Lower Body Bathing: Minimal assistance;Sit to/from stand   Upper Body Dressing : Set up;Sitting   Lower Body Dressing: Sit to/from stand;Minimal assistance   Toilet Transfer: Moderate assistance;Regular Toilet;Grab bars;RW Statistician Details (indicate cue type and reason): transferred to toilet with PT needing mod assist to rise Toileting- Clothing Manipulation and Hygiene: Maximal assistance;Sit to/from stand       Functional mobility during ADLs: Minimal assistance;Rolling walker General ADL Comments: Min assist with RW for steadying and walker management     Vision Baseline Vision/History: 4 Cataracts Patient Visual Report: No change from baseline       Perception     Praxis      Pertinent Vitals/Pain Pain Assessment: No/denies pain     Hand Dominance Right   Extremity/Trunk Assessment Upper  Extremity Assessment Upper Extremity Assessment: Overall WFL for tasks assessed;RUE deficits/detail;LUE deficits/detail RUE Deficits / Details: WFL ROM, overall 4/5 strength throughout but unable to sustain resistance. RUE Coordination:   (grossly functional but he reports decreased dexterity.) LUE Deficits / Details: WFL ROM, overall 4/5 strength throughout but unable to sustain resistance. LUE Coordination:  (grossly functional)   Lower Extremity Assessment Lower Extremity Assessment: Defer to PT evaluation   Cervical / Trunk Assessment Cervical / Trunk Assessment: Normal   Communication Communication Communication: No difficulties   Cognition Arousal/Alertness: Awake/alert Behavior During Therapy: WFL for tasks assessed/performed Overall Cognitive Status: Within Functional Limits for tasks assessed                                 General Comments: Hx of dementia. Is alert to self, year and month. Able to follow commands.   General Comments       Exercises     Shoulder Instructions      Home Living Family/patient expects to be discharged to:: Skilled nursing facility                                 Additional Comments: Prior to being at SNF patient was homeless.      Prior Functioning/Environment Level of Independence: Needs assistance  Gait / Transfers Assistance Needed: has been using wc at facility. used wc as a seat and a walker prior ADL's / Homemaking Assistance Needed: reports needing assistance with ADLs            OT Problem List: Decreased activity tolerance;Impaired balance (sitting and/or standing);Decreased cognition;Decreased safety awareness;Decreased knowledge of use of DME or AE      OT Treatment/Interventions: Self-care/ADL training;Therapeutic exercise;DME and/or AE instruction;Therapeutic activities;Balance training;Patient/family education    OT Goals(Current goals can be found in the care plan section) Acute Rehab OT Goals Patient Stated Goal: to feel stronger OT Goal Formulation: With patient Time For Goal Achievement: 10/04/21 Potential to Achieve Goals: Fair  OT Frequency: Min 2X/week   Barriers to D/C:            Co-evaluation               AM-PAC OT "6 Clicks" Daily Activity     Outcome Measure Help from another person eating meals?: A Little Help from another person taking care of personal grooming?: A Little Help from another person toileting, which includes using toliet, bedpan, or urinal?: A Lot Help from another person bathing (including washing, rinsing, drying)?: A Little Help from another person to put on and taking off regular upper body clothing?: A Little Help from another person to put on and taking off regular lower body clothing?: A Little 6 Click Score: 17   End of Session Equipment Utilized During Treatment: Rolling walker Nurse Communication: Mobility status  Activity Tolerance: Patient tolerated treatment well Patient left: in chair;with call bell/phone within reach;with chair alarm set  OT Visit Diagnosis: Unsteadiness on feet (R26.81);History of falling (Z91.81)                Time: 8338-2505 OT Time Calculation (min): 17 min Charges:  OT General Charges $OT Visit: 1 Visit OT Evaluation $OT Eval Moderate Complexity: 1 Mod  Tarynn Garling, OTR/L Acute Care Rehab Services  Office [phone] Pager: [phone]   Kelli Churn 09/20/2021, 12:52 PM

## 2021-09-20 NOTE — Progress Notes (Signed)
[facility]                                                                              Patient Demographics  Johnny Dunlap, is a 71 y.o. male, DOB - 1950/07/21, QVZ:[id number]  Admit date - 09/19/2021   Admitting Physician Therisa Doyne, MD  Outpatient Primary MD for the patient is Pcp, No  Outpatient specialists:   LOS - 1  days   Medical records reviewed and are as summarized below:    Chief Complaint  Patient presents with   Altered Mental Status   Fall   Nausea       Brief summary   Patient is a 71 year old male with history of liver cirrhosis, COPD, malnutrition, dementia presented with a fall.  Larey Seat and hit his head, had a small skin tear of the left elbow.  Per SNF, patient was noted to be more confused and had been falling more often.  Patient was also reported to have brown sputum, low-grade fevers and cough.  Reported easy bleeding with minimal injury, slight amount of blood per rectum earlier last week.   Assessment & Plan   Acute hepatic encephalopathy -Currently much more alert and oriented, x2, possibly due to hepatic encephalopathy, UTI and pneumonia -Continue lactulose  Dehydration with lactic acidosis -Continue gentle hydration  CAP, community-acquired pneumonia -CT abdomen showed tree-in-bud pattern up in the lungs.  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   Weight as of this encounter: 65.7 kg.  Code Status: DNR status DVT Prophylaxis:  SCDs Start: 09/20/21 0154   Level of Care: Level of care: Progressive Family Communication: Discussed all imaging results, lab results, explained to the patient    Disposition Plan:     Status is: Inpatient  Remains inpatient appropriate because:Inpatient level of care appropriate due to severity of illness  Dispo: The patient is from: SNF              Anticipated d/c is to: SNF              Patient currently is not medically stable to d/c.   Difficult to place patient No      Time Spent in minutes   35 minutes  Procedures:  None  Consultants:   None  Antimicrobials:   Anti-infectives (From admission, onward)    Start     Dose/Rate Route Frequency Ordered Stop   09/20/21 2200  cefTRIAXone (ROCEPHIN) 2  g in sodium chloride 0.9 % 100 mL IVPB        2 g 200 mL/hr over 30 Minutes Intravenous Every 24 hours 09/19/21 2158 09/25/21 2159   09/20/21 2200  azithromycin (ZITHROMAX) 500 mg in sodium chloride 0.9 % 250 mL IVPB        500 mg 250 mL/hr over 60 Minutes Intravenous Every 24 hours 09/19/21 2158 09/25/21 2159   09/19/21 2200  azithromycin (ZITHROMAX) 500 mg in sodium chloride 0.9 % 250 mL IVPB        500 mg 250 mL/hr over 60 Minutes Intravenous  Once 09/19/21 2148 09/20/21 0026   09/19/21 2115  cefTRIAXone (ROCEPHIN) 1 g in sodium chloride 0.9 % 100 mL IVPB        1 g 200 mL/hr over 30 Minutes Intravenous  Once 09/19/21 2103 09/19/21 2228          Medications  Scheduled Meds:  Chlorhexidine Gluconate Cloth  6 each Topical Q0600   feeding supplement  237 mL Oral TID BM   folic acid  1 mg Oral Daily   lactulose  30 g Oral TID   midodrine   5 mg Oral TID WC   mupirocin ointment  1 application Nasal BID   sodium chloride flush  3 mL Intravenous Q12H   thiamine injection  100 mg Intravenous Daily   Continuous Infusions:  sodium chloride     azithromycin     cefTRIAXone (ROCEPHIN)  IV     PRN Meds:.sodium chloride, sodium chloride flush      Subjective:   [name] was seen and examined today.  More alert and awake today, oriented x2.  Has underlying dementia.  Review of systems somewhat difficult to obtain due to his mental status.  No ongoing nausea vomiting, chest pain or shortness of breath  Objective:   Vitals:   09/20/21 0222 09/20/21 0306 09/20/21 0547 09/20/21 1315  BP:  100/65 94/62 (!) 105/45  Pulse:  69 62 82  Resp:   14 18  Temp:   97.8 F (36.6 C) (!) 97.4 F (36.3 C)  TempSrc:   Oral Oral  SpO2:   99% 100%  Weight: 65.7 kg     Height: 6' (1.829 m)       Intake/Output Summary (Last 24 hours) at 09/20/2021 1456 Last data filed at 09/20/2021 0900 Gross per 24 hour  Intake 990.97 ml  Output 1400 ml  Net -409.03 ml     Wt Readings from Last 3 Encounters:  09/20/21 65.7 kg  08/10/21 68.4 kg  07/23/21 65.8 kg     Exam General: Alert and oriented x 2, self and place Cardiovascular: S1 S2 auscultated, no murmurs, RRR Respiratory: Clear to auscultation bilaterally, no wheezing, rales or rhonchi Gastrointestinal: Soft, nontender, nondistended, + bowel sounds Ext: no pedal edema bilaterally Neuro: moving all 4 extremities Psych:  still somewhat confused, has dementia   Data Reviewed:  I have personally reviewed following labs and imaging studies  Micro Results Recent Results (from the past 240 hour(s))  Culture, blood (routine x 2) Call MD if unable to obtain prior to antibiotics being given     Status: None (Preliminary result)   Collection Time: 09/19/21 10:00 PM   Specimen: BLOOD RIGHT ARM  Result Value Ref Range Status   Specimen Description   Final    BLOOD RIGHT  ARM Performed at [facility], [address] [place], [place] [address]    Special Requests  Final    BOTTLES DRAWN AEROBIC AND ANAEROBIC Blood Culture adequate volume Performed at [facility], [address] [place], [place] [address]    Culture   Final    NO GROWTH < 12 HOURS Performed at [facility], [address] [place], [place] [address]    Report Status PENDING  Incomplete  Culture, blood (routine x 2) Call MD if unable to obtain prior to antibiotics being given     Status: None (Preliminary result)   Collection Time: 09/19/21 10:48 PM   Specimen: BLOOD  Result Value Ref Range Status   Specimen Description   Final    BLOOD LEFT ANTECUBITAL Performed at [facility], [address] [place], [place] [address]    Special Requests   Final    BOTTLES DRAWN AEROBIC AND ANAEROBIC Blood Culture adequate volume Performed at [facility], [address], [place], [place] [address]    Culture   Final    NO GROWTH < 12 HOURS Performed at [facility], [address] [place], [place] [address]    Report Status PENDING  Incomplete  MRSA Next Gen by PCR, Nasal     Status: Abnormal   Collection Time: 09/20/21  5:15 AM   Specimen: Nasal Mucosa; Nasal Swab  Result Value Ref Range Status   MRSA by PCR Next Gen DETECTED (A) NOT DETECTED Final    Comment: RESULT CALLED TO, READ BACK BY AND VERIFIED WITH: Rex Kras RN ON 09/20/2021 @ 0817 BY MECIAL J. (NOTE) The GeneXpert MRSA Assay (FDA approved for NASAL specimens only), is one component of a comprehensive MRSA colonization surveillance program. It is not intended to diagnose MRSA infection nor to guide or monitor treatment for MRSA infections. Test performance is not FDA approved in patients less than 21 years old. Performed at [facility], [address], [place], [place] [address]     Radiology Reports DG Chest 2  View  Result Date: 09/19/2021 CLINICAL DATA:  Status post fall. EXAM: CHEST - 2 VIEW COMPARISON:  August 01, 2021 FINDINGS: Chronic appearing increased lung markings are seen without evidence of acute infiltrate, pleural effusion or pneumothorax. The heart size and mediastinal contours are within normal limits. The visualized skeletal structures are unremarkable. IMPRESSION: No active cardiopulmonary disease. Electronically Signed   By: Aram Candela M.D.   On: 09/19/2021 22:15   CT HEAD WO CONTRAST ( )  Result Date: 09/19/2021 CLINICAL DATA:  Head trauma, minor (Age >= 65y) head injury Trauma. Fall off hammock.  Right upper extremity pain. EXAM: CT HEAD WITHOUT CONTRAST TECHNIQUE: Contiguous axial images were obtained from the base of the skull through the vertex without intravenous contrast. COMPARISON:  None. FINDINGS: Brain: No acute hemorrhage. No subdural or extra-axial collection. Generalized atrophy is normal for age. There is advanced periventricular and deep white matter hypodensity, nonspecific but typically chronic small vessel ischemia. No evidence of acute infarct. No hydrocephalus, midline shift, or mass effect. Incidental mega cisterna magna. Vascular: Atherosclerosis of skullbase vasculature without hyperdense vessel or abnormal calcification. Skull: No fracture or focal lesion. Sinuses/Orbits: No acute facial bone fracture. Left nasal bone fracture appears remote. Scattered mucosal thickening of ethmoid air cells. No mastoid effusion. Left cataract resection. Other: No confluent scalp contusion. IMPRESSION: 1. No acute intracranial abnormality. No skull fracture. 2. Advanced chronic small vessel ischemia. Electronically Signed   By: Narda Rutherford M.D.   On: 09/19/2021 20:09   CT Cervical  Spine Wo Contrast  Result Date: 09/19/2021 CLINICAL DATA:  Neck trauma (Age >= 65y) fall Fall today striking head. EXAM: CT CERVICAL SPINE WITHOUT CONTRAST TECHNIQUE: Multidetector CT imaging of  the cervical spine was performed without intravenous contrast. Multiplanar CT image reconstructions were also generated. COMPARISON:  None. FINDINGS: Alignment: No traumatic subluxation straightening of normal lordosis. Trace anterolisthesis of C4 on C5 and retrolisthesis of C6 on C7. Skull base and vertebrae: No acute fracture. Dens and skull base are intact. There is bony fusion of C1 in the occiput. Non fusion posterior elements of C1. Soft tissues and spinal canal: No prevertebral fluid or swelling. No visible canal hematoma. Disc levels: Degenerative disc disease at C2-C3, C5-C6 and C6-C7. Scattered facet hypertrophy. Mild disc bulge at C3-C4. No high-grade canal stenosis. Upper chest: Biapical pleuroparenchymal scarring. Emphysema. Calcified granuloma. No acute findings. Other: Carotid calcifications. IMPRESSION: Degenerative change in the cervical spine without acute fracture or subluxation. Electronically Signed   By: Narda Rutherford M.D.   On: 09/19/2021 20:06   CT ABDOMEN PELVIS W CONTRAST  Result Date: 09/19/2021 CLINICAL DATA:  Acute abdominal pain.  Fall today. EXAM: CT ABDOMEN AND PELVIS WITH CONTRAST TECHNIQUE: Multidetector CT imaging of the abdomen and pelvis was performed using the standard protocol following bolus administration of intravenous contrast. CONTRAST:  52mL OMNIPAQUE IOHEXOL 350 MG/ML SOLN COMPARISON:  None. FINDINGS: Lower chest: Tree-in-bud opacities in the left lower lobe. Coronary artery calcifications, normal heart size. Paraesophageal varices. Hepatobiliary: Cirrhotic hepatic morphology with nodular hepatic contours. No evidence of focal hepatic lesion. Peripheral calcification in the right hepatic lobe. Intraluminal gallstones without pericholecystic inflammation. Marked dilatation of the left portal vein with large recannulated umbilical vein and abdominal collaterals. Pancreas: No ductal dilatation or inflammation. Spleen: Splenomegaly. The spleen spans 14.2 x 10.1 x 10.1  cm (volume = 758 cm^3). No focal splenic abnormality. Adrenals/Urinary Tract: No adrenal nodule. Nonobstructing stones in the lower right kidney. Punctate nonobstructing stone in the upper right kidney. No hydronephrosis. Homogeneous renal enhancement. Small cyst in the left kidney. Diminished excretion on delayed phase imaging. Distended urinary bladder with mild wall thickening. Left lateral bladder diverticulum. No perivesicular fat stranding. Stomach/Bowel: Paraesophageal and perigastric varices. Decompressed stomach. There is wall thickening of the cecum and ascending colon. Moderate colonic stool burden. No bowel obstruction. Normal appendix. Vascular/Lymphatic: Dilated but patent portal vein. Dilated left portal vein with large recannulated umbilical vein. Large varices extend into umbilical hernia. Prominent portosystemic collaterals in the abdomen and pelvis. Advanced aortic and branch atherosclerosis. No aortic aneurysm. No bulky abdominopelvic adenopathy. Reproductive: Prostate is unremarkable. Other: No ascites. No free air. Upper abdominal ventral abdominal hernia contains collaterals. Umbilical hernia contains collaterals. Musculoskeletal: No acute fracture of the lumbar spine or pelvis. No suspicious bone abnormality. IMPRESSION: 1. Cirrhosis with portal hypertension, splenomegaly, and prominent portosystemic collaterals. No ascites. 2. Mild wall thickening of the cecum and ascending colon. This is a typical location for portal colopathy, infectious or inflammatory colitis is not excluded. 3. Distended urinary bladder with mild wall thickening. Left bladder diverticulum. Suspect chronic bladder outlet obstruction, however recommend correlation with urinalysis to exclude urinary tract infection. 4. Cholelithiasis without gallbladder inflammation. 5. Nonobstructing right nephrolithiasis. 6. Tree-in-bud opacities in the left lower lobe, likely infectious or inflammatory, recommend correlation for  aspiration risk factors. Aortic Atherosclerosis (ICD10-I70.0). Electronically Signed   By: Narda Rutherford M.D.   On: 09/19/2021 20:16    Lab Data:  CBC: Recent Labs  Lab 09/19/21 1817 09/20/21 0328  WBC 4.4  3.8*  NEUTROABS 2.8 2.4  HGB 9.6* 8.7*  HCT 29.6* 26.6*  MCV 99.3 98.9  PLT 53* 47*   Basic Metabolic Panel: Recent Labs  Lab 09/19/21 1817 09/20/21 0156 09/20/21 0328  NA 142  --  143  K 4.0  --  3.8  CL 113*  --  113*  CO2 22  --  22  GLUCOSE 100*  --  121*  BUN 25*  --  29*  CREATININE 0.95  --  1.10  CALCIUM 9.4  --  8.9  MG  --  2.0 1.9  PHOS  --  3.1 3.3   GFR: Estimated Creatinine Clearance: 58.9 mL/min (by C-G formula based on SCr of 1.1 mg/dL). Liver Function Tests: Recent Labs  Lab 09/19/21 1817 09/20/21 0328  AST 41 37  ALT 22 21  ALKPHOS 161* 129*  BILITOT 3.9* 3.1*  PROT 7.0 6.2*  ALBUMIN 2.9* 2.6*   Recent Labs  Lab 09/19/21 1817  LIPASE 38   Recent Labs  Lab 09/19/21 1817 09/20/21 0328  AMMONIA 80* 178*   Coagulation Profile: Recent Labs  Lab 09/19/21 2248  INR 1.7*   Cardiac Enzymes: Recent Labs  Lab 09/20/21 0156  CKTOTAL 59   BNP (last 3 results) No results for input(s): PROBNP in the last 8760 hours. HbA1C: No results for input(s): HGBA1C in the last 72 hours. CBG: No results for input(s): GLUCAP in the last 168 hours. Lipid Profile: No results for input(s): CHOL, HDL, LDLCALC, TRIG, CHOLHDL, LDLDIRECT in the last 72 hours. Thyroid Function Tests: Recent Labs    09/20/21 0328  TSH 3.002   Anemia Panel: No results for input(s): VITAMINB12, FOLATE, FERRITIN, TIBC, IRON, RETICCTPCT in the last 72 hours. Urine analysis:    Component Value Date/Time   COLORURINE YELLOW 09/19/2021 1919   APPEARANCEUR CLEAR 09/19/2021 1919   LABSPEC 1.015 09/19/2021 1919   PHURINE 7.5 09/19/2021 1919   GLUCOSEU NEGATIVE 09/19/2021 1919   HGBUR LARGE (A) 09/19/2021 1919   BILIRUBINUR NEGATIVE 09/19/2021 1919   KETONESUR  NEGATIVE 09/19/2021 1919   PROTEINUR NEGATIVE 09/19/2021 1919   NITRITE POSITIVE (A) 09/19/2021 1919   LEUKOCYTESUR SMALL (A) 09/19/2021 1919     Sharyl Panchal M.D. [facility] 09/20/2021, 2:56 PM  Available via Epic secure chat 7am-7pm After 7 pm, please refer to night coverage provider listed on amion.

## 2021-09-20 NOTE — Progress Notes (Signed)
Initial Nutrition Assessment  DOCUMENTATION CODES:   Severe malnutrition in context of chronic illness  INTERVENTION:  - will order Ensure Plus TID, each supplement provides 350 kcal and 13 grams of protein.   NUTRITION DIAGNOSIS:   Severe Malnutrition related to chronic illness (COPD) as evidenced by severe fat depletion, severe muscle depletion.  GOAL:   Patient will meet greater than or equal to 90% of their needs  MONITOR:   PO intake, Supplement acceptance, Labs, Weight trends  REASON FOR ASSESSMENT:   Consult Assessment of nutrition requirement/status  ASSESSMENT:   71 y.o. male with medical history of liver cirrhosis and COPD. He presented to the ED after a fall and report of increased confusion and slight amount of blood per rectum. In 07/2021 he was admitted d/t L forearm cellulitis.  Patient resting in bed with no family or visitors present. He reports being admitted from [place].   He recalls eating 100% of breakfast this AM and most of lunch this afternoon except for tea (not sweet enough) and broccoli (too crunchy). He has a very good appetite, is able to feed himself, has no swallowing difficulties, no chewing difficulties as long as items are soft enough, and loves Ensure (especially chocolate flavor). He was receiving [facility]ure TID at [facility] and would like to continue this during hospitalization.  Weight today is 145 lb, weight on 08/10/21 was 150 lb, and weight on 07/23/21 was 145 lb.    Labs reviewed; Cl: 113 mmol/l, BUN: 29 mg/dl, ammonia: 701 umol/l. Medications reviewed; 1 mg folvite/day, 30 g lactulose TID, 100 mg IV thiamine/day.    NUTRITION - FOCUSED PHYSICAL EXAM:  Flowsheet Row Most Recent Value  Orbital Region Moderate depletion  Upper Arm Region Severe depletion  Thoracic and Lumbar Region Moderate depletion  Buccal Region Severe depletion  Temple Region Severe depletion  Clavicle Bone Region Severe depletion  Clavicle and  Acromion Bone Region Severe depletion  Scapular Bone Region Unable to assess  Dorsal Hand Moderate depletion  Patellar Region Moderate depletion  Anterior Thigh Region Severe depletion  Posterior Calf Region Moderate depletion  Edema (RD Assessment) None  Hair Reviewed  Eyes Reviewed  Mouth Reviewed  [no teeth]  Skin Reviewed  Nails Reviewed       Diet Order:   Diet Order             Diet Heart Room service appropriate? Yes; Fluid consistency: Thin  Diet effective now                   EDUCATION NEEDS:   No education needs have been identified at this time  Skin:  Skin Assessment: Reviewed RN Assessment  Last BM:  9/21 (type 1 x1 and type 2 x1)  Height:   Ht Readings from Last 1 Encounters:  09/20/21 6' (1.829 m)    Weight:   Wt Readings from Last 1 Encounters:  09/20/21 65.7 kg     Estimated Nutritional Needs:  Kcal:  1970-2200 kcal Protein:  100-115 grams Fluid:  >/= 2.2 L/day     Trenton Gammon, MS, RD, LDN, CNSC Inpatient Clinical Dietitian RD pager # available in AMION  After hours/weekend pager # available in [facility]

## 2021-09-20 NOTE — Evaluation (Signed)
Physical Therapy Evaluation Patient Details Name: Johnny Dunlap MRN: [id number] DOB: Feb 10, 1950 Today's Date: 09/20/2021  History of Present Illness  Johnny Dunlap is a 71 y.o. male presents due to fall. Pt from [facility], more confused per staff. CT no acute intracranial abnormality; no skull fracture. PMG: liver cirrhosis, COPD, dementia, severe malnutrition   Clinical Impression  Pt admitted with above diagnosis. [facility] from [facility], using w/c majority of the time at facility and occasional short distances with RW and facility assistance. Pt currently requiring min-mod A with mobility, slightly unsteady while ambulating requiring min A to steady and to maneuver RW. Pt repeats questions, but easy to redirect and follows commands appropriately. Pt currently with functional limitations due to the deficits listed below (see PT Problem List). Pt will benefit from skilled PT to increase their independence and safety with mobility to allow discharge to the venue listed below.          Recommendations for follow up therapy are one component of a multi-disciplinary discharge planning process, led by the attending physician.  Recommendations may be updated based on patient status, additional functional criteria and insurance authorization.  Follow Up Recommendations SNF    Equipment Recommendations  None recommended by PT    Recommendations for Other Services       Precautions / Restrictions Precautions Precautions: Fall Restrictions Weight Bearing Restrictions: No      Mobility  Bed Mobility Overal bed mobility: Needs Assistance Bed Mobility: Supine to Sit  Supine to sit: Min assist  General bed mobility comments: increased time, use of bedrail, min A to upright trunk and scoot out to EOB with use of bedpad    Transfers Overall transfer level: Needs assistance Equipment used: Rolling walker (2 wheeled) Transfers: Sit to/from Stand Sit to Stand: Min assist;Mod assist   General transfer comment: min A from EOB with RW and mod A from low seated toilet with RW and handrail  Ambulation/Gait Ambulation/Gait assistance: Min Chemical engineer (Feet): 24 Feet Assistive device: Rolling walker (2 wheeled) Gait Pattern/deviations: Step-through pattern;Decreased stride length;Narrow base of support Gait velocity: decreased   General Gait Details: narrow BOS with occasional brushing of LEs against one another, VCs for maneuvering and positioning within RW frame with fair carryover  Stairs            Wheelchair Mobility    Modified Rankin (Stroke Patients Only)       Balance Overall balance assessment: Needs assistance Sitting-balance support: Feet supported Sitting balance-Leahy Scale: Good Sitting balance - Comments: seated EOB, kyphotic/slouched posture   Standing balance support: During functional activity;Bilateral upper extremity supported Standing balance-Leahy Scale: Poor Standing balance comment: reliant on UE support       Pertinent Vitals/Pain Pain Assessment: No/denies pain    Home Living Family/patient expects to be discharged to:: Skilled nursing facility  Additional Comments: Prior to being at SNF patient was homeless.    Prior Function Level of Independence: Needs assistance   Gait / Transfers Assistance Needed: has been using w/c majority of the time, short ambulation bours with RW with assist at facility  ADL's / Homemaking Assistance Needed: reports needing assistance with ADLs        Hand Dominance   Dominant Hand: Right    Extremity/Trunk Assessment   Upper Extremity Assessment Upper Extremity Assessment: Defer to OT evaluation RUE Deficits / Details: WFL ROM, overall 4/5 strength throughout but unable to sustain resistance. RUE Coordination:  (grossly functional but he reports decreased dexterity.) LUE Deficits /  Details: WFL ROM, overall 4/5 strength throughout but unable to sustain resistance. LUE  Coordination:  (grossly functional)    Lower Extremity Assessment Lower Extremity Assessment: Generalized weakness;RLE deficits/detail;LLE deficits/detail RLE Deficits / Details: AROM WNL throughout, strength grossly 4/5 RLE Coordination:  (grossly functional) LLE Deficits / Details: AROM WNL throughout, strength grossly 4/5 LLE Coordination:  (grossly functional)    Cervical / Trunk Assessment Cervical / Trunk Assessment: Kyphotic  Communication   Communication: No difficulties  Cognition Arousal/Alertness: Awake/alert Behavior During Therapy: WFL for tasks assessed/performed Overall Cognitive Status: History of cognitive impairments - at baseline  General Comments: Repeats questions, easy to redirect, appropriate with following commands      General Comments      Exercises     Assessment/Plan    PT Assessment Patient needs continued PT services  PT Problem List Decreased strength;Decreased activity tolerance;Decreased balance;Decreased mobility;Decreased knowledge of use of DME       PT Treatment Interventions DME instruction;Gait training;Functional mobility training;Therapeutic activities;Therapeutic exercise;Balance training;Patient/family education;Wheelchair mobility training    PT Goals (Current goals can be found in the Care Plan section)  Acute Rehab PT Goals Patient Stated Goal: return to [facility] PT Goal Formulation: With patient Time For Goal Achievement: 10/04/21 Potential to Achieve Goals: Good    Frequency Min 2X/week   Barriers to discharge        Co-evaluation               AM-PAC PT "6 Clicks" Mobility  Outcome Measure Help needed turning from your back to your side while in a flat bed without using bedrails?: A Little Help needed moving from lying on your back to sitting on the side of a flat bed without using bedrails?: A Little Help needed moving to and from a bed to a chair (including a wheelchair)?: A Little Help needed  standing up from a chair using your arms (e.g., wheelchair or bedside chair)?: A Little Help needed to walk in hospital room?: A Little Help needed climbing 3-5 steps with a railing? : A Lot 6 Click Score: 17    End of Session Equipment Utilized During Treatment: Gait belt Activity Tolerance: Patient tolerated treatment well;Patient limited by fatigue Patient left: in chair;with call bell/phone within reach;with chair alarm set Nurse Communication: Mobility status;Other (comment) (NT in room assisting with pericare and linen change) PT Visit Diagnosis: Unsteadiness on feet (R26.81);Other abnormalities of gait and mobility (R26.89);Muscle weakness (generalized) (M62.81)    Time: 1791-5056 PT Time Calculation (min) (ACUTE ONLY): 25 min   Charges:   PT Evaluation $PT Eval Low Complexity: 1 Low PT Treatments $Therapeutic Activity: 8-22 mins         Tori Clayten Allcock PT, DPT 09/20/21, 1:36 PM

## 2021-09-20 NOTE — Consult Note (Signed)
Consultation Note Date: 09/21/2021   Patient Name: Johnny Dunlap  DOB: July 27, 1950  MRN: [id number]  Age / Sex: 71 y.o., male  PCP: Pcp, No Referring Physician: Mendel Corning, MD  Reason for Consultation: Establishing goals of care  HPI/Patient Profile: 71 y.o. male  with past medical history of liver cirrhosis, COPD, malnutrition, dementia admitted on 09/19/2021 following fall at [facility] where he is a long-term resident.  He has been noted to be confused and falling more often over the past couple of days.  He also reports having low-grade fever and cough with some extra sputum production.  He is currently being treated for As well as UTI and reports that he is feeling better today.  Palliative consulted for goals of care.  Clinical Assessment and Goals of Care: Palliative care consult received.  Chart reviewed including personal review of pertinent labs and imaging.  I met today with Johnny Dunlap.  He was awake and alert and lying in bed in no distress.  I introduced palliative care as specialized medical care for people living with serious illness. It focuses on providing relief from the symptoms and stress of a serious illness. The goal is to improve quality of life for both the patient and the family.  He tells me that continuing to live as well as he can is what is most important to him.  He lives long-term at [facility].  He spends most of his day reading, watching television, and arguing with his roommate about what to watch on television.  He primarily enjoys shows about [place].    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  Following that, he worked for [employer] in [place], [place] and working on Nutritional therapist and transmissions.  Following his divorce, he relocated to [place] and has been here ever since.  He is not married, has no children, and  has no brothers or sisters.  He tells me that his closest relative would be [employer], but he has not spoken with him for more than 5 years and does not have any contact information.  We discussed clinical course as well as wishes moving forward in regard to advanced directives.  Concepts specific to code status and rehospitalization discussed.  We discussed continued changes that occur with nutrition, cognition, and functional status in light of chronic illness.  We discussed difference between a aggressive medical intervention path and a palliative, comfort focused care path.  Values and goals of care important to Mr. Felicetti were attempted to be elicited.  Following discussion, he tells me that his goal is to get back to skilled facility (he is agreeable to return to [place] but also states he can go somewhere else if necessary) and continue with his life as he had previously to this admission.  He understands that he will continue to have changes as he has progression of his chronic underlying conditions, but at this point he finds his quality of  life to be acceptable.  He would want to return to the hospital to treat treatable conditions in the future.   Questions and concerns addressed.   PMT will continue to support holistically.  SUMMARY OF RECOMMENDATIONS   - DNR/DNI - He has no designated surrogate and reports that he does not know whom he would designate.  His closest living relative is a cousin, IT consultant, with whom he has not spoken in 5 years.  He does not have contact information for him either.  I asked him to consider who would be the best person to make decisions on his behalf if the situation arises where he cannot make his own medical decisions. - Continue current care.  He lives long term at SNF and plans to return there at discharge.  He feels that he is benefiting from hospitalization and wants to return to the hospital in the future to treat treatable conditions. -Could  consider outpatient palliative care follow-up at time of discharge.  Code Status/Advance Care Planning: DNR   Palliative Prophylaxis:  Delirium Protocol  Psycho-social/Spiritual:  Desire for further Chaplaincy support:no  Prognosis:  Unable to determine  Discharge Planning: Long-term care.  Currently lives at [facility].  Likely return there at discharge.     Primary Diagnoses: Present on Admission:  Acute hepatic encephalopathy  Thrombocytopenia (HCC)  Protein-calorie malnutrition, severe  Other cirrhosis of liver (HCC)  Iron deficiency anemia  COVID-19 virus infection  CAP (community acquired pneumonia)  Acute lower UTI  Acute urinary retention   I have reviewed the medical record, interviewed the patient and family, and examined the patient. The following aspects are pertinent.  Past Medical History:  Diagnosis Date   Cirrhosis ([place])    COPD (chronic obstructive pulmonary disease) ([place])    Social History   Socioeconomic History   Marital status: Legally Separated    Spouse name: Not on file   Number of children: Not on file   Years of education: Not on file   Highest education level: Not on file  Occupational History   Not on file  Tobacco Use   Smoking status: Some Days    Types: Cigarettes   Smokeless tobacco: Never  Substance and Sexual Activity   Alcohol use: Not Currently   Drug use: Never   Sexual activity: Not on file  Other Topics Concern   Not on file  Social History Narrative   Not on file   Social Determinants of Health   Financial Resource Strain: Not on file  Food Insecurity: Not on file  Transportation Needs: Not on file  Physical Activity: Not on file  Stress: Not on file  Social Connections: Not on file   Family History  Problem Relation Age of Onset   CAD Father    CAD Sister    Scheduled Meds:  azithromycin  500 mg Oral Daily   Chlorhexidine Gluconate Cloth  6 each Topical Q0600   diclofenac Sodium  2 g Topical  TID   feeding supplement  237 mL Oral TID BM   folic acid  1 mg Oral Daily   lactulose  30 g Oral TID   midodrine  5 mg Oral TID WC   mupirocin ointment  1 application Nasal BID   sodium chloride flush  3 mL Intravenous Q12H   thiamine  100 mg Oral Daily   Continuous Infusions:  sodium chloride     cefTRIAXone (ROCEPHIN)  IV Stopped (09/20/21 2132)   PRN Meds:.sodium chloride, sodium chloride  flush Medications Prior to Admission:  Prior to Admission medications   Medication Sig Start Date End Date Taking? Authorizing Provider  furosemide (LASIX) 40 MG tablet Take 1 tablet (40 mg total) by mouth daily as needed for fluid or edema. 08/11/21  Yes Ghimire, Henreitta Leber, MD  gabapentin (NEURONTIN) 400 MG capsule Take 400 mg by mouth 3 (three) times daily.   Yes [provider]  midodrine (PROAMATINE) 10 MG tablet Take 0.5 tablets (5 mg total) by mouth 3 (three) times daily with meals. Hold if SBP is more than 130 08/11/21  Yes Ghimire, Henreitta Leber, MD  Multiple Vitamin (MULTIVITAMIN WITH MINERALS) TABS tablet Take 1 tablet by mouth daily. 08/09/21  Yes Ghimire, Henreitta Leber, MD  Vitamin D, Ergocalciferol, (DRISDOL) 1.25 MG (50000 UNIT) CAPS capsule Take 50,000 Units by mouth every 7 (seven) days. Mondays   Yes [provider]  feeding supplement (ENSURE ENLIVE / ENSURE PLUS) LIQD Take 237 mLs by mouth 2 (two) times daily between meals. Patient not taking: Reported on 09/20/2021 08/11/21   Jonetta Osgood, MD   No Known Allergies Review of Systems  Constitutional:  Positive for activity change and fatigue.  Respiratory:  Positive for cough.        Sputum production  Neurological:  Positive for weakness.   Physical Exam General: Alert, awake, in no acute distress.  Oriented to self and place.  Somewhat confused but able to participate in conversation appropriately. HEENT: No bruits, no goiter, no JVD Heart: Regular rate and rhythm. No murmur appreciated. Lungs: Good air movement,  clear Abdomen: Soft, nontender, nondistended, positive bowel sounds.   Ext: No significant edema Skin: Warm and dry Neuro: Grossly intact, nonfocal.   Vital Signs: BP 107/62 (BP Location: Left Arm)   Pulse 80   Temp 98.2 F (36.8 C) (Oral)   Resp 18   Ht 6' (1.829 m)   Wt 65.7 kg   SpO2 98%   BMI 19.64 kg/m  Pain Scale: 0-10   Pain Score: 0-No pain   SpO2: SpO2: 98 % O2 Device:SpO2: 98 % O2 Flow Rate: .   IO: Intake/output summary:  Intake/Output Summary (Last 24 hours) at 09/21/2021 7672 Last data filed at 09/21/2021 0800 Gross per 24 hour  Intake 1690.03 ml  Output 1475 ml  Net 215.03 ml    LBM: Last BM Date: 09/20/21 Baseline Weight: Weight: 65.7 kg Most recent weight: Weight: 65.7 kg     Palliative Assessment/Data:   Flowsheet Rows    Flowsheet Row Most Recent Value  Intake Tab   Referral Department Hospitalist  Unit at Time of Referral Med/Surg Unit  Palliative Care Primary Diagnosis Sepsis/Infectious Disease  Date Notified 09/20/21  Palliative Care Type New Palliative care  Reason for referral Clarify Goals of Care  Date of Admission 09/19/21  Date first seen by Palliative Care 09/20/21  # of days Palliative referral response time 0 Day(s)  # of days IP prior to Palliative referral 1  Clinical Assessment   Palliative Performance Scale Score 40%  Psychosocial & Spiritual Assessment   Palliative Care Outcomes   Patient/Family meeting held? Yes  Who was at the meeting? Patient       Time In: 1540 Time Out: 1700 Time Total: 80 Greater than 50%  of this time was spent counseling and coordinating care related to the above assessment and plan.  Signed by: Micheline Rough, MD   Please contact Palliative Medicine Team phone at [phone] for questions and concerns.  For  individual provider: See Shea Evans

## 2021-09-21 DIAGNOSIS — N390 Urinary tract infection, site not specified: Secondary | ICD-10-CM | POA: Diagnosis not present

## 2021-09-21 DIAGNOSIS — Z7189 Other specified counseling: Secondary | ICD-10-CM | POA: Diagnosis not present

## 2021-09-21 DIAGNOSIS — J189 Pneumonia, unspecified organism: Secondary | ICD-10-CM | POA: Diagnosis not present

## 2021-09-21 DIAGNOSIS — K7200 Acute and subacute hepatic failure without coma: Secondary | ICD-10-CM | POA: Diagnosis not present

## 2021-09-21 DIAGNOSIS — Z515 Encounter for palliative care: Secondary | ICD-10-CM | POA: Diagnosis not present

## 2021-09-21 DIAGNOSIS — R338 Other retention of urine: Secondary | ICD-10-CM | POA: Diagnosis not present

## 2021-09-21 LAB — LEGIONELLA PNEUMOPHILA SEROGP 1 UR AG: L. pneumophila Serogp 1 Ur Ag: NEGATIVE

## 2021-09-21 LAB — RESP PANEL BY RT-PCR (FLU A&B, COVID) ARPGX2
Influenza A by PCR: NEGATIVE
Influenza B by PCR: NEGATIVE
SARS Coronavirus 2 by RT PCR: POSITIVE — AB

## 2021-09-21 MED ORDER — CEFPODOXIME PROXETIL 200 MG PO TABS: 200.0000 mg | ORAL_TABLET | Freq: Two times a day (BID) | ORAL | 0 refills | Status: AC

## 2021-09-21 MED ORDER — THIAMINE HCL 100 MG PO TABS
100.0000 mg | ORAL_TABLET | Freq: Every day | ORAL | Status: DC
  Administered 2021-09-21 – 2021-09-23 (×3): 100 mg via ORAL
  Filled 2021-09-21 (×3): qty 1

## 2021-09-21 MED ORDER — DICLOFENAC SODIUM 1 % EX GEL
2.0000 g | Freq: Three times a day (TID) | CUTANEOUS | Status: DC
  Administered 2021-09-21 – 2021-09-23 (×9): 2 g via TOPICAL
  Filled 2021-09-21: qty 100

## 2021-09-21 MED ORDER — TAMSULOSIN HCL 0.4 MG PO CAPS: 0.4000 mg | ORAL_CAPSULE | Freq: Every day | ORAL | Status: AC

## 2021-09-21 MED ORDER — THIAMINE HCL 100 MG PO TABS: 100.0000 mg | ORAL_TABLET | Freq: Every day | ORAL | Status: AC

## 2021-09-21 MED ORDER — AZITHROMYCIN 500 MG PO TABS
500.0000 mg | ORAL_TABLET | Freq: Every day | ORAL | Status: DC
Start: 2021-09-21 — End: 2021-09-22

## 2021-09-21 MED ORDER — TAMSULOSIN HCL 0.4 MG PO CAPS
0.4000 mg | ORAL_CAPSULE | Freq: Every day | ORAL | Status: DC
  Administered 2021-09-21 – 2021-09-23 (×3): 0.4 mg via ORAL
  Filled 2021-09-21 (×3): qty 1

## 2021-09-21 MED ORDER — AZITHROMYCIN 250 MG PO TABS
500.0000 mg | ORAL_TABLET | Freq: Every day | ORAL | Status: DC
  Administered 2021-09-21 – 2021-09-22 (×2): 500 mg via ORAL
  Filled 2021-09-21 (×2): qty 2

## 2021-09-21 MED ORDER — CEFPODOXIME PROXETIL 100 MG PO TABS: 100.0000 mg | ORAL_TABLET | Freq: Two times a day (BID) | ORAL | Status: DC

## 2021-09-21 MED ORDER — FOLIC ACID 1 MG PO TABS: 1.0000 mg | ORAL_TABLET | Freq: Every day | ORAL | Status: AC

## 2021-09-21 MED ORDER — DICLOFENAC SODIUM 1 % EX GEL: 2.0000 g | Freq: Three times a day (TID) | CUTANEOUS | Status: DC

## 2021-09-21 MED ORDER — LACTULOSE 10 GM/15ML PO SOLN: 30.0000 g | Freq: Three times a day (TID) | ORAL | 0 refills | Status: AC

## 2021-09-21 NOTE — Discharge Summary (Addendum)
Physician Discharge Summary   Patient ID: Johnny Dunlap MRN: [id number] DOB/AGE: 69/04/1950 69 y.o.  Admit date: 09/19/2021 Discharge date: 09/21/2021  Primary Care Physician:  Pcp, No   Recommendations for Outpatient Follow-up:  Follow up with PCP in 1-2 weeks Continue Zithromax 500 mg p.o. daily for 4 more days Continue Vantin 200 mg twice a day for 4 more days Started on lactulose 45 ml/30 g total 3 times a day, titrate for 2-3 BMs in a day Started on Flomax 0.4 mg p.o. daily  Home Health: Patient returning to skilled nursing facility Equipment/Devices:   Discharge Condition: stable  CODE STATUS: FULL or DNR   Diet recommendation:    Discharge Diagnoses:     Acute hepatic encephalopathy-resolved  Thrombocytopenia (HCC)  Protein-calorie malnutrition, severe  Other cirrhosis of liver (HCC)  Iron deficiency anemia  COVID-19 virus infection  CAP (community acquired pneumonia)  Acute lower UTI  Acute urinary retention   Consults: None    Allergies:  No Known Allergies   DISCHARGE MEDICATIONS: Allergies as of 09/21/2021   No Known Allergies      Medication List     TAKE these medications    azithromycin 500 MG tablet Commonly known as: ZITHROMAX Take 1 tablet (500 mg total) by mouth daily for 4 days.   cefpodoxime 200 MG tablet Commonly known as: VANTIN Take 1 tablet (200 mg total) by mouth 2 (two) times daily for 4 days.   diclofenac Sodium 1 % Gel Commonly known as: VOLTAREN Apply 2 g topically 3 (three) times daily. Apply to bilateral feet   feeding supplement Liqd Take 237 mLs by mouth 2 (two) times daily between meals.   folic acid 1 MG tablet Commonly known as: FOLVITE Take 1 tablet (1 mg total) by mouth daily. Start taking on: September 22, 2021   furosemide 40 MG tablet Commonly known as: LASIX Take 1 tablet (40 mg total) by mouth daily as needed for fluid or edema.   gabapentin 400 MG capsule Commonly known as: NEURONTIN Take  400 mg by mouth 3 (three) times daily.   lactulose 10 GM/15ML solution Commonly known as: CHRONULAC Take 45 mLs (30 g total) by mouth 3 (three) times daily.   midodrine 10 MG tablet Commonly known as: PROAMATINE Take 0.5 tablets (5 mg total) by mouth 3 (three) times daily with meals. Hold if SBP is more than 130   multivitamin with minerals Tabs tablet Take 1 tablet by mouth daily.   tamsulosin 0.4 MG Caps capsule Commonly known as: FLOMAX Take 1 capsule (0.4 mg total) by mouth daily.   thiamine 100 MG tablet Take 1 tablet (100 mg total) by mouth daily. Start taking on: September 22, 2021   Vitamin D (Ergocalciferol) 1.25 MG (50000 UNIT) Caps capsule Commonly known as: DRISDOL Take 50,000 Units by mouth every 7 (seven) days. Mondays         Brief H and P: For complete details please refer to admission H and P, but in brief Patient is a 71 year old male with history of liver cirrhosis, COPD, malnutrition, dementia presented with a fall.  Larey Seat and hit his head, had a small skin tear of the left elbow.  Per SNF, patient was noted to be more confused and had been falling more often.  Patient was also reported to have brown sputum, low-grade fevers and cough.  Reported easy bleeding with minimal injury, slight amount of blood per rectum earlier last week.  Hospital Course:   Acute hepatic encephalopathy -Resolved,  currently alert and oriented, appears to be at his baseline  -Likely encephalopathy secondary to possibly due to hepatic encephalopathy, UTI and pneumonia -Continue lactulose, titrate to 2-3 BMs in a day   Dehydration with lactic acidosis -Patient was placed on gentle hydration, currently tolerating diet   CAP, community-acquired pneumonia -CT abdomen showed tree-in-bud pattern up in the lungs.  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   Weight as of this encounter: 65.7 kg.  Day of Discharge S: No complaints, wants Voltaren gel for his feet, tolerating diet.  BP 107/62 (BP Location: Left Arm)   Pulse 80   Temp 98.2 F (36.8 C) (Oral)   Resp 18   Ht 6' (1.829 m)   Wt 65.7 kg   SpO2 98%   BMI 19.64 kg/m   Physical Exam: General: Alert and awake oriented x3 not in any acute distress. CVS: S1-S2 clear no murmur rubs or gallops Chest: clear to auscultation bilaterally, no wheezing rales or rhonchi Abdomen: soft nontender, nondistended, normal bowel sounds Extremities: no cyanosis, clubbing or edema noted bilaterally Neuro: no new deficits    Get Medicines reviewed and adjusted: Please take all your medications with you for your next visit with your Primary MD  Please request your Primary MD to go over all hospital tests and procedure/radiological results at the follow up. Please ask your Primary MD to get all Hospital records sent to his/her office.  If you experience worsening of your admission symptoms, develop shortness of breath, life threatening emergency, suicidal or homicidal thoughts you must seek medical  attention immediately by calling 911 or calling your MD immediately  if symptoms less severe.  You must read complete instructions/literature along with all the possible adverse reactions/side effects for all the Medicines you take and that have been prescribed to you. Take any new Medicines after you have completely understood and accept all the possible adverse reactions/side effects.   Do not drive when taking pain medications.   Do not take more than prescribed Pain, Sleep and Anxiety Medications  Special Instructions: If you have smoked or chewed Tobacco  in the last 2 yrs please stop smoking, stop any regular Alcohol  and or any Recreational drug use.  Wear Seat belts while driving.  Please note  You were cared for by a hospitalist during your hospital stay. Once you are discharged, your primary care physician will handle any further medical issues. Please note that NO REFILLS for any discharge medications will be authorized once you are discharged, as it is imperative that you return to your primary care physician (or establish a  relationship with a primary care physician if you do not have one) for your aftercare needs so that they can reassess your need for medications and monitor your lab values.   The results of significant diagnostics from this hospitalization (including imaging, microbiology, ancillary and laboratory) are listed below for reference.      Procedures/Studies:  DG Chest 2 View  Result Date: 09/19/2021 CLINICAL DATA:  Status post fall. EXAM: CHEST - 2 VIEW COMPARISON:  August 01, 2021 FINDINGS: Chronic appearing increased lung markings are seen without evidence of acute infiltrate, pleural effusion or pneumothorax. The heart size and mediastinal contours are within normal limits. The visualized skeletal structures are unremarkable. IMPRESSION: No active cardiopulmonary disease. Electronically Signed   By: Aram Candela M.D.   On: 09/19/2021 22:15   CT HEAD WO  CONTRAST ( )  Result Date: 09/19/2021 CLINICAL DATA:  Head trauma, minor (Age >= 65y) head injury Trauma. Fall off hammock.  Right upper extremity pain. EXAM: CT HEAD WITHOUT CONTRAST TECHNIQUE: Contiguous axial images were obtained from the base of the skull through the vertex without intravenous contrast. COMPARISON:  None. FINDINGS: Brain: No acute hemorrhage. No subdural or extra-axial collection. Generalized atrophy is normal for age. There is advanced periventricular and deep white matter hypodensity, nonspecific but typically chronic small vessel ischemia. No evidence of acute infarct. No hydrocephalus, midline shift, or mass effect. Incidental mega cisterna magna. Vascular: Atherosclerosis of skullbase vasculature without hyperdense vessel or abnormal calcification. Skull: No fracture or focal lesion. Sinuses/Orbits: No acute facial bone fracture. Left nasal bone fracture appears remote. Scattered mucosal thickening of ethmoid air cells. No mastoid effusion. Left cataract resection. Other: No confluent scalp contusion. IMPRESSION: 1. No acute intracranial abnormality. No skull fracture. 2. Advanced chronic small vessel ischemia. Electronically Signed   By: Narda Rutherford M.D.   On: 09/19/2021 20:09   CT Cervical Spine Wo Contrast  Result Date: 09/19/2021 CLINICAL DATA:  Neck trauma (Age >= 65y) fall Fall today striking head. EXAM: CT CERVICAL SPINE WITHOUT CONTRAST TECHNIQUE: Multidetector CT imaging of the cervical spine was performed without intravenous contrast. Multiplanar CT image reconstructions were also generated. COMPARISON:  None. FINDINGS: Alignment: No traumatic subluxation straightening of normal lordosis. Trace anterolisthesis of C4 on C5 and retrolisthesis of C6 on C7. Skull base and vertebrae: No acute fracture. Dens and skull base are intact. There is bony fusion of C1 in the occiput. Non fusion posterior elements of C1. Soft tissues and spinal canal: No prevertebral fluid or  swelling. No visible canal hematoma. Disc levels: Degenerative disc disease at C2-C3, C5-C6 and C6-C7. Scattered facet hypertrophy. Mild disc bulge at C3-C4. No high-grade canal stenosis. Upper chest: Biapical pleuroparenchymal scarring. Emphysema. Calcified granuloma. No acute findings. Other: Carotid calcifications. IMPRESSION: Degenerative change in the cervical spine without acute fracture or subluxation. Electronically Signed   By: Narda Rutherford M.D.   On: 09/19/2021 20:06   CT ABDOMEN PELVIS W CONTRAST  Result Date: 09/19/2021 CLINICAL DATA:  Acute abdominal pain.  Fall today. EXAM: CT ABDOMEN AND PELVIS WITH CONTRAST TECHNIQUE: Multidetector CT imaging of the abdomen and pelvis was performed using the standard protocol following bolus administration of intravenous contrast. CONTRAST:  17mL OMNIPAQUE IOHEXOL 350 MG/ML SOLN COMPARISON:  None. FINDINGS: Lower chest: Tree-in-bud opacities in the left lower lobe. Coronary artery calcifications, normal heart size. Paraesophageal varices. Hepatobiliary: Cirrhotic hepatic morphology with nodular hepatic contours. No evidence of focal hepatic lesion. Peripheral calcification in the right hepatic lobe. Intraluminal gallstones without pericholecystic inflammation. Marked dilatation  of the left portal vein with large recannulated umbilical vein and abdominal collaterals. Pancreas: No ductal dilatation or inflammation. Spleen: Splenomegaly. The spleen spans 14.2 x 10.1 x 10.1 cm (volume = 758 cm^3). No focal splenic abnormality. Adrenals/Urinary Tract: No adrenal nodule. Nonobstructing stones in the lower right kidney. Punctate nonobstructing stone in the upper right kidney. No hydronephrosis. Homogeneous renal enhancement. Small cyst in the left kidney. Diminished excretion on delayed phase imaging. Distended urinary bladder with mild wall thickening. Left lateral bladder diverticulum. No perivesicular fat stranding. Stomach/Bowel: Paraesophageal and perigastric  varices. Decompressed stomach. There is wall thickening of the cecum and ascending colon. Moderate colonic stool burden. No bowel obstruction. Normal appendix. Vascular/Lymphatic: Dilated but patent portal vein. Dilated left portal vein with large recannulated umbilical vein. Large varices extend into umbilical hernia. Prominent portosystemic collaterals in the abdomen and pelvis. Advanced aortic and branch atherosclerosis. No aortic aneurysm. No bulky abdominopelvic adenopathy. Reproductive: Prostate is unremarkable. Other: No ascites. No free air. Upper abdominal ventral abdominal hernia contains collaterals. Umbilical hernia contains collaterals. Musculoskeletal: No acute fracture of the lumbar spine or pelvis. No suspicious bone abnormality. IMPRESSION: 1. Cirrhosis with portal hypertension, splenomegaly, and prominent portosystemic collaterals. No ascites. 2. Mild wall thickening of the cecum and ascending colon. This is a typical location for portal colopathy, infectious or inflammatory colitis is not excluded. 3. Distended urinary bladder with mild wall thickening. Left bladder diverticulum. Suspect chronic bladder outlet obstruction, however recommend correlation with urinalysis to exclude urinary tract infection. 4. Cholelithiasis without gallbladder inflammation. 5. Nonobstructing right nephrolithiasis. 6. Tree-in-bud opacities in the left lower lobe, likely infectious or inflammatory, recommend correlation for aspiration risk factors. Aortic Atherosclerosis (ICD10-I70.0). Electronically Signed   By: Narda Rutherford M.D.   On: 09/19/2021 20:16      LAB RESULTS: Basic Metabolic Panel: Recent Labs  Lab 09/19/21 1817 09/20/21 0156 09/20/21 0328  NA 142  --  143  K 4.0  --  3.8  CL 113*  --  113*  CO2 22  --  22  GLUCOSE 100*  --  121*  BUN 25*  --  29*  CREATININE 0.95  --  1.10  CALCIUM 9.4  --  8.9  MG  --    < > 1.9  PHOS  --    < > 3.3   < > = values in this interval not displayed.    Liver Function Tests: Recent Labs  Lab 09/19/21 1817 09/20/21 0328  AST 41 37  ALT 22 21  ALKPHOS 161* 129*  BILITOT 3.9* 3.1*  PROT 7.0 6.2*  ALBUMIN 2.9* 2.6*   Recent Labs  Lab 09/19/21 1817  LIPASE 38   Recent Labs  Lab 09/19/21 1817 09/20/21 0328  AMMONIA 80* 178*   CBC: Recent Labs  Lab 09/19/21 1817 09/20/21 0328  WBC 4.4 3.8*  NEUTROABS 2.8 2.4  HGB 9.6* 8.7*  HCT 29.6* 26.6*  MCV 99.3 98.9  PLT 53* 47*   Cardiac Enzymes: Recent Labs  Lab 09/20/21 0156  CKTOTAL 59   BNP: Invalid input(s): POCBNP CBG: No results for input(s): GLUCAP in the last 168 hours.     Disposition and Follow-up: Discharge Instructions     Diet - low sodium heart healthy   Complete by: As directed    Increase activity slowly   Complete by: As directed         DISPOSITION: Skilled nursing facility   DISCHARGE FOLLOW-UP   Follow up with PCP at skilled nursing facility in  1-2 weeks   Time coordinating discharge:  35 mins   Signed:   Thad Ranger M.D. [facility] 09/21/2021, 11:28 AM

## 2021-09-21 NOTE — Progress Notes (Signed)
Daily Progress Note   Patient Name: Johnny Dunlap       Date: 09/21/2021 DOB: 22-Jun-1950  Age: 71 y.o. MRN#: [id number] Attending Physician: Cathren Harsh, MD Primary Care Physician: Pcp, No Admit Date: 09/19/2021  Reason for Consultation/Follow-up: Establishing goals of care  Subjective: I stop by to check in briefly with Johnny Dunlap.  I told him that I would stop by today after he had a chance to think about if there is someone he would like to name is his surrogate decision maker.  He shares that he still has not made a decision regarding this but will continue to think about it.  Our discussion yesterday was that he really does not have anybody who is close to him nor does he have any living family other than his cousin whom he does not know how to reach.  Length of Stay: 2  Current Medications: Scheduled Meds:  . azithromycin  500 mg Oral Daily  . Chlorhexidine Gluconate Cloth  6 each Topical Q0600  . diclofenac Sodium  2 g Topical TID  . feeding supplement  237 mL Oral TID BM  . folic acid  1 mg Oral Daily  . lactulose  30 g Oral TID  . midodrine  5 mg Oral TID WC  . mupirocin ointment  1 application Nasal BID  . sodium chloride flush  3 mL Intravenous Q12H  . tamsulosin  0.4 mg Oral Daily  . thiamine  100 mg Oral Daily    Continuous Infusions: . sodium chloride    . cefTRIAXone (ROCEPHIN)  IV Stopped (09/20/21 2132)    PRN Meds: sodium chloride, sodium chloride flush  Physical Exam         General: Alert, awake, in no acute distress.  Oriented to self and place.  Somewhat confused but able to participate in conversation appropriately. HEENT: No bruits, no goiter, no JVD Heart: Regular rate and rhythm. No murmur appreciated. Lungs: Good air movement, clear Abdomen:  Soft, nontender, nondistended, positive bowel sounds.   Ext: No significant edema Skin: Warm and dry Neuro: Grossly intact, nonfocal.   Vital Signs: BP 115/62 (BP Location: Right Arm)   Pulse 77   Temp 97.7 F (36.5 C) (Oral)   Resp 18   Ht 6' (1.829 m)   Wt 65.7 kg  SpO2 100%   BMI 19.64 kg/m  SpO2: SpO2: 100 % O2 Device: O2 Device: Room Air O2 Flow Rate:    Intake/output summary:  Intake/Output Summary (Last 24 hours) at 09/21/2021 1415 Last data filed at 09/21/2021 1300 Gross per 24 hour  Intake 1450.03 ml  Output 1475 ml  Net -24.97 ml   LBM: Last BM Date: 09/20/21 Baseline Weight: Weight: 65.7 kg Most recent weight: Weight: 65.7 kg       Palliative Assessment/Data:    Flowsheet Rows    Flowsheet Row Most Recent Value  Intake Tab   Referral Department Hospitalist  Unit at Time of Referral Med/Surg Unit  Palliative Care Primary Diagnosis Sepsis/Infectious Disease  Date Notified 09/20/21  Palliative Care Type New Palliative care  Reason for referral Clarify Goals of Care  Date of Admission 09/19/21  Date first seen by Palliative Care 09/20/21  # of days Palliative referral response time 0 Day(s)  # of days IP prior to Palliative referral 1  Clinical Assessment   Palliative Performance Scale Score 40%  Psychosocial & Spiritual Assessment   Palliative Care Outcomes   Patient/Family meeting held? Yes  Who was at the meeting? Patient       Patient Active Problem List   Diagnosis Date Noted  . Acute hepatic encephalopathy 09/19/2021  . CAP (community acquired pneumonia) 09/19/2021  . Acute lower UTI 09/19/2021  . Acute urinary retention 09/19/2021  . Protein-calorie malnutrition, severe 08/11/2021  . Sepsis (HCC) 08/01/2021  . Left arm cellulitis   . Lactic acidosis   . Other cirrhosis of liver (HCC)   . Thrombocytopenia (HCC)   . COVID-19 virus infection   . Iron deficiency anemia   . Hypokalemia   . GI bleed 07/23/2021    Palliative Care  Assessment & Plan   Patient Profile: 71 y.o. male  with past medical history of liver cirrhosis, COPD, malnutrition, dementia admitted on 09/19/2021 following fall at [facility] where he is a long-term resident.  He has been noted to be confused and falling more often over the past couple of days.  He also reports having low-grade fever and cough with some extra sputum production.  He is currently being treated for As well as UTI and reports that he is feeling better today.  Palliative consulted for goals of care.  Recommendations/Plan: DNR/DNI-there was a copy of his DNR form in electronic record but not a hard copy on the chart.  I placed a hard copy of a durable DNR on his chart for discharge. I checked in again with Johnny Dunlap and he still does not know whom he would name is his surrogate decision maker.  I encouraged him to continue to think about this following discharge.Marland Kitchen  His closest living relative is a cousin, Automotive engineer, with whom he has not spoken in 5 years.  He does not have contact information for him. Continue current care.  He lives long term at SNF and plans to return there at discharge.  He feels that he is benefiting from hospitalization and wants to return to the hospital in the future to treat treatable conditions.  Code Status:    Code Status Orders  (From admission, onward)           Start     Ordered   09/20/21 0008  Do not attempt resuscitation (DNR)  Continuous       Question Answer Comment  In the event of cardiac or respiratory ARREST Do not call a "  code blue"   In the event of cardiac or respiratory ARREST Do not perform Intubation, CPR, defibrillation or ACLS   In the event of cardiac or respiratory ARREST Use medication by any route, position, wound care, and other measures to relive pain and suffering. May use oxygen, suction and manual treatment of airway obstruction as needed for comfort.   Comments nurse may pronounce      09/20/21 0007            Code Status History     Date Active Date Inactive Code Status Order ID Comments User Context   08/01/2021 1807 08/15/2021 1916 DNR [id number]  Alford Highland, MD ED   07/23/2021 1820 07/24/2021 2059 Full Code [id number]  Verdia Kuba, DO ED      Advance Directive Documentation    Flowsheet Row Most Recent Value  Type of Advance Directive Out of facility DNR (pink MOST or yellow form)  Pre-existing out of facility DNR order (yellow form or pink MOST form) --  "MOST" Form in Place? --       Prognosis:  Unable to determine  Discharge Planning: Long-term care  Care plan was discussed with patient  Thank you for allowing the Palliative Medicine Team to assist in the care of this patient.   Total Time 20 Prolonged Time Billed No      Greater than 50%  of this time was spent counseling and coordinating care related to the above assessment and plan.  Romie Minus, MD  Please contact Palliative Medicine Team phone at [phone] for questions and concerns.

## 2021-09-21 NOTE — TOC Progression Note (Signed)
Transition of Care [facility]) - Progression Note    Patient Details  Name: Johnny Dunlap MRN: [id number] Date of Birth: August 14, 1950  Transition of [facility]) CM/SW Contact  Darleene Cleaver, [place] Phone Number: 09/21/2021, 4:43 PM  Clinical Narrative:     CSW spoke to [facility], per SNF patient is not LTC at facility yet.  SNF needs to get insurance authorization for patient to return.  CSW was also informed that patient will need a new Passar number.  Patient is a Level 2 Manual screen, CSW awaiting for [facility] to see patient and provide Passar number.  Patient unable to discharge today, CSW updated attending physician and charge nurse.  CSW to continue to follow patient's progress throughout discharge planning.        Expected Discharge Plan and Services           Expected Discharge Date: 09/21/21                                     Social Determinants of Health (SDOH) Interventions    Readmission Risk Interventions No flowsheet data found.

## 2021-09-21 NOTE — Progress Notes (Signed)
PHARMACIST - PHYSICIAN COMMUNICATION  DR:   Isidoro Donning  CONCERNING: IV to Oral Route Change Policy  RECOMMENDATION: This patient is receiving Thiamine by the intravenous route.  Based on criteria approved by the Pharmacy and Therapeutics Committee, the intravenous medication(s) is/are being converted to the equivalent oral dose form(s).   DESCRIPTION: These criteria include: The patient is eating (either orally or via tube) and/or has been taking other orally administered medications for a least 24 hours The patient has no evidence of active gastrointestinal bleeding or impaired GI absorption (gastrectomy, short bowel, patient on TNA or NPO).  If you have questions about this conversion, please contact the Pharmacy Department  []   [phone] )  ( [phone] []   [phone] )  [facility] []   [phone] )  [facility] []   [phone] )  [facility] [x]   [phone] )  [facility]   ( [phone] [place], [facility] 09/21/2021 7:20 AM

## 2021-09-21 NOTE — [place] FL2 (Signed)
[place] MEDICAID FL2 LEVEL OF CARE SCREENING TOOL     IDENTIFICATION  Patient Name: Johnny Dunlap Birthdate: 03-21-1950 Sex: male Admission Date (Current Location): 09/19/2021  [place] and [place] Number:  Haynes Bast [id number] N Facility and Address:  [facility],  [address] [place] [address]      Provider Number: [id number]  Attending Physician Name and Address:  Cathren Harsh, MD  Relative Name and Phone Number:       Current Level of Care: Hospital Recommended Level of Care: Skilled Nursing Facility Prior Approval Number:    Date Approved/Denied:   PASRR Number: Pending  Discharge Plan: SNF    Current Diagnoses: Patient Active Problem List   Diagnosis Date Noted   Acute hepatic encephalopathy 09/19/2021   CAP (community acquired pneumonia) 09/19/2021   Acute lower UTI 09/19/2021   Acute urinary retention 09/19/2021   Protein-calorie malnutrition, severe 08/11/2021   Sepsis (HCC) 08/01/2021   Left arm cellulitis    Lactic acidosis    Other cirrhosis of liver (HCC)    Thrombocytopenia (HCC)    COVID-19 virus infection    Iron deficiency anemia    Hypokalemia    GI bleed 07/23/2021    Orientation RESPIRATION BLADDER Height & Weight     Self, Place  Normal Incontinent Weight: 144 lb 13.5 oz (65.7 kg) Height:  6' (182.9 cm)  BEHAVIORAL SYMPTOMS/MOOD NEUROLOGICAL BOWEL NUTRITION STATUS      Incontinent Diet (Regular)  AMBULATORY STATUS COMMUNICATION OF NEEDS Skin   Limited Assist Verbally Normal                       Personal Care Assistance Level of Assistance  Bathing, Dressing, Feeding Bathing Assistance: Limited assistance Feeding assistance: Independent Dressing Assistance: Limited assistance     Functional Limitations Info  Sight, Hearing, Speech Sight Info: Adequate Hearing Info: Adequate Speech Info: Adequate    SPECIAL CARE FACTORS FREQUENCY  PT (By licensed PT), OT (By licensed OT)     PT Frequency: Minimum  5x a week OT Frequency: Minimum 5x a week            Contractures Contractures Info: Not present    Additional Factors Info  Code Status, Allergies Code Status Info: DNR Allergies Info: NKA           Current Medications (09/21/2021):  This is the current hospital active medication list Current Facility-Administered Medications  Medication Dose Route Frequency Provider Last Rate Last Admin   0.9 %  sodium chloride infusion  250 mL Intravenous PRN Doutova, Anastassia, MD       azithromycin (ZITHROMAX) tablet 500 mg  500 mg Oral Daily Rai, Ripudeep K, MD   500 mg at 09/21/21 1048   cefTRIAXone (ROCEPHIN) 2 g in sodium chloride 0.9 % 100 mL IVPB  2 g Intravenous Q24H Therisa Doyne, MD   Stopped at 09/20/21 2132   Chlorhexidine Gluconate Cloth 2 % PADS 6 each  6 each Topical Q0600 Rai, Ripudeep K, MD   6 each at 09/21/21 0531   diclofenac Sodium (VOLTAREN) 1 % topical gel 2 g  2 g Topical TID Rai, Ripudeep K, MD   2 g at 09/21/21 1043   feeding supplement (ENSURE ENLIVE / ENSURE PLUS) liquid 237 mL  237 mL Oral TID BM Rai, Ripudeep K, MD   237 mL at 09/21/21 0834   folic acid (FOLVITE) tablet 1 mg  1 mg Oral Daily Doutova, Anastassia, MD   1 mg  at 09/21/21 0829   lactulose (CHRONULAC) 10 GM/15ML solution 30 g  30 g Oral TID Therisa Doyne, MD   30 g at 09/21/21 0827   midodrine (PROAMATINE) tablet 5 mg  5 mg Oral TID WC Doutova, Anastassia, MD   5 mg at 09/21/21 1610   mupirocin ointment (BACTROBAN) 2 % 1 application  1 application Nasal BID Rai, Ripudeep K, MD   1 application at 09/21/21 0829   sodium chloride flush (NS) 0.9 % injection 3 mL  3 mL Intravenous Q12H Doutova, Anastassia, MD   3 mL at 09/21/21 0834   sodium chloride flush (NS) 0.9 % injection 3 mL  3 mL Intravenous PRN Therisa Doyne, MD       tamsulosin (FLOMAX) capsule 0.4 mg  0.4 mg Oral Daily Rai, Ripudeep K, MD       thiamine tablet 100 mg  100 mg Oral Daily Rai, Ripudeep K, MD   100 mg at 09/21/21 9604      Discharge Medications: Please see discharge summary for a list of discharge medications.  Relevant Imaging Results:  Relevant Lab Results:   Additional Information SSN [id number]  Darleene Cleaver, LCSW

## 2021-09-22 DIAGNOSIS — R338 Other retention of urine: Secondary | ICD-10-CM | POA: Diagnosis not present

## 2021-09-22 DIAGNOSIS — N390 Urinary tract infection, site not specified: Secondary | ICD-10-CM | POA: Diagnosis not present

## 2021-09-22 DIAGNOSIS — K7200 Acute and subacute hepatic failure without coma: Secondary | ICD-10-CM | POA: Diagnosis not present

## 2021-09-22 LAB — URINE CULTURE: Culture: >=100000 — AB

## 2021-09-22 MED ORDER — DOXYCYCLINE HYCLATE 100 MG PO TABS
100.0000 mg | ORAL_TABLET | Freq: Two times a day (BID) | ORAL | Status: DC
  Administered 2021-09-22 – 2021-09-23 (×4): 100 mg via ORAL
  Filled 2021-09-22 (×3): qty 1

## 2021-09-22 MED ORDER — ACETAMINOPHEN-CODEINE #3 300-30 MG PO TABS
1.0000 | ORAL_TABLET | Freq: Once | ORAL | Status: AC
  Administered 2021-09-22: 1 via ORAL
  Filled 2021-09-22: qty 1

## 2021-09-22 MED ORDER — DOXYCYCLINE HYCLATE 100 MG PO CAPS: 100.0000 mg | ORAL_CAPSULE | Freq: Two times a day (BID) | ORAL | 0 refills | Status: AC

## 2021-09-22 NOTE — Progress Notes (Signed)
[facility]                                                                              Patient Demographics  Johnny Dunlap, is a 71 y.o. male, DOB - 1950-02-09, SWH:[id number]  Admit date - 09/19/2021   Admitting Physician Therisa Doyne, MD  Outpatient Primary MD for the patient is [facility]  Outpatient specialists:   LOS - 3  days   Medical records reviewed and are as summarized below:    Chief Complaint  Patient presents with   Altered Mental Status   Fall   Nausea       Brief summary   Patient is a 71 year old male with history of liver cirrhosis, COPD, malnutrition, dementia presented with a fall.  Larey Seat and hit his head, had a small skin tear of the left elbow.  Per SNF, patient was noted to be more confused and had been falling more often.  Patient was also reported to have brown sputum, low-grade fevers and cough.  Reported easy bleeding with minimal injury, slight amount of blood per rectum earlier last week.   Assessment & Plan   Acute hepatic encephalopathy -Alert and oriented, appears to be at baseline, likely worsening due to hepatic encephalopathy, UTI and pneumonia  -Continue lactulose, titrate to 3 BMs in a day  Dehydration with lactic acidosis -Patient tolerating diet, IV fluids discontinued  CAP, community-acquired pneumonia -CT abdomen showed tree-in-bud pattern up in the lungs.  Chest x-ray showed no active cardiopulmonary disease, chronic appearing increased lung markings -Transitioned to oral Zithromax, vantin -Urine strep antigen, urine Legionella antigen negative  Acute lower UTI -Currently alert and oriented, no fevers or leukocytosis, -Urine culture showed staph epidermidis -Sensitivities reviewed, placed on doxycycline.  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 Follow counts,  currently no bleeding.  COVID-19 in July -Had a persistent positive COVID-19 antigen  Urinary retention -Bladder scan showed> 600 cc, Foley catheter was placed with good drainage  Iron deficiency anemia -H&H currently stable   Severe protein calorie malnutrition Estimated body mass index is 19.64 kg/m as calculated from the following:   Height as of this encounter: 6' (1.829 m).   Weight as of this encounter: 65.7 kg.  Code Status: DNR status DVT Prophylaxis:  SCDs Start: 09/20/21 0154   Level of Care: Level of care: Progressive Family Communication: Discussed all imaging results, lab results, explained to the patient    Disposition Plan:     Status is: Inpatient  Remains inpatient appropriate because:Inpatient level of care appropriate due to severity of illness  Dispo: The patient is from: SNF              Anticipated d/c is to: SNF              Patient currently is medically stable to d/c.    Difficult to place patient No   Patient is being discharged to nursing facility, medically clear.  DC summary was done on 9/23, stillstands, no changes.   Time Spent in minutes  25 minutes  Procedures:  None  Consultants:   None  Antimicrobials:   Anti-infectives (From admission, onward)    Start     Dose/Rate Route Frequency Ordered Stop   09/21/21 1000  azithromycin (ZITHROMAX) tablet 500 mg        500 mg Oral Daily 09/21/21 0849     09/21/21 0000  azithromycin (ZITHROMAX) 500 MG tablet        500 mg Oral Daily 09/21/21 0852 09/25/21 2359   09/21/21 0000  cefpodoxime (VANTIN) 100 MG tablet  Status:  Discontinued        100 mg Oral 2 times daily 09/21/21 0852 09/21/21    09/21/21 0000  cefpodoxime (VANTIN) 200 MG tablet        200 mg Oral 2 times daily 09/21/21 0921 09/25/21 2359   09/20/21 2200  cefTRIAXone (ROCEPHIN) 2 g in sodium chloride 0.9 % 100 mL IVPB        2 g 200 mL/hr over 30 Minutes Intravenous Every 24 hours 09/19/21 2158 09/25/21 2159   09/20/21  2200  azithromycin (ZITHROMAX) 500 mg in sodium chloride 0.9 % 250 mL IVPB  Status:  Discontinued        500 mg 250 mL/hr over 60 Minutes Intravenous Every 24 hours 09/19/21 2158 09/21/21 0849   09/19/21 2200  azithromycin (ZITHROMAX) 500 mg in sodium chloride 0.9 % 250 mL IVPB        500 mg 250 mL/hr over 60 Minutes Intravenous  Once 09/19/21 2148 09/20/21 0026   09/19/21 2115  cefTRIAXone (ROCEPHIN) 1 g in sodium chloride 0.9 % 100 mL IVPB        1 g 200 mL/hr over 30 Minutes Intravenous  Once 09/19/21 2103 09/19/21 2228          Medications  Scheduled Meds:  azithromycin  500 mg Oral Daily   Chlorhexidine Gluconate Cloth  6 each Topical Q0600   diclofenac Sodium  2 g Topical TID   feeding supplement  237 mL Oral TID BM   folic acid  1 mg Oral Daily   lactulose  30 g Oral TID   midodrine  5 mg Oral TID WC   mupirocin ointment  1 application Nasal BID   sodium chloride flush  3 mL Intravenous Q12H   tamsulosin  0.4 mg Oral Daily   thiamine  100 mg Oral Daily   Continuous Infusions:  sodium chloride     cefTRIAXone (ROCEPHIN)  IV Stopped (09/21/21 2143)   PRN Meds:.sodium chloride, sodium chloride flush      Subjective:   Johnny Dunlap was seen and examined today.  Alert and awake, watching football game on TV.  No acute complaints.    Objective:   Vitals:   09/21/21 1131 09/21/21 2115 09/22/21 0612 09/22/21 1143  BP: 115/62 127/61 96/63 (!) 98/59  Pulse: 77 81 73 68  Resp: Temp: 97.7 F (36.5 C) 98.3 F (36.8 C) 98.4 F (36.9 C) 97.7 F (36.5 C)  TempSrc: Oral Oral Oral Oral  SpO2: 100% 97% 97% 100%  Weight:      Height:        Intake/Output Summary (Last 24 hours) at 09/22/2021 1322 Last data filed at 09/22/2021 1610 Gross per 24 hour  Intake 695 ml  Output 1070 ml  Net -375 ml     Wt Readings from Last 3 Encounters:  09/20/21 65.7 kg  08/10/21 68.4 kg  07/23/21 65.8 kg   Physical Exam  General: Alert and oriented x 3, NAD,  watching TV Cardiovascular: S1 S2 clear, RRR. No pedal edema b/l Respiratory: CTAB, no wheezing, rales or rhonchi Gastrointestinal: Soft, nontender, nondistended, NBS Ext: no pedal edema bilaterally Neuro: no new deficits   Data Reviewed:  I have personally reviewed following labs and imaging studies  Micro Results Recent Results (from the past 240 hour(s))  Urine Culture     Status: Abnormal   Collection Time: 09/19/21  7:19 PM   Specimen: Urine, Clean Catch  Result Value Ref Range Status   Specimen Description   Final    URINE, CLEAN CATCH Performed at [facility], [address] [place], [place] [address]    Special Requests   Final    NONE Performed at [facility], [address], [place], [place] [address]    Culture >=100,000 COLONIES/mL STAPHYLOCOCCUS EPIDERMIDIS (A)  Final   Report Status 09/22/2021 FINAL  Final   Organism ID, Bacteria STAPHYLOCOCCUS EPIDERMIDIS (A)  Final      Susceptibility   Staphylococcus epidermidis - MIC*    CIPROFLOXACIN >=8 RESISTANT Resistant     GENTAMICIN <=0.5 SENSITIVE Sensitive     NITROFURANTOIN <=16 SENSITIVE Sensitive     OXACILLIN >=4 RESISTANT Resistant     TETRACYCLINE <=1 SENSITIVE Sensitive     VANCOMYCIN 1 SENSITIVE Sensitive     TRIMETH/SULFA <=10 SENSITIVE Sensitive     CLINDAMYCIN <=0.25 SENSITIVE Sensitive     RIFAMPIN <=0.5 SENSITIVE Sensitive     Inducible Clindamycin NEGATIVE Sensitive     * >=100,000 COLONIES/mL STAPHYLOCOCCUS EPIDERMIDIS  Culture, blood (routine x 2) Call MD if unable to obtain prior to antibiotics being given     Status: None (Preliminary result)   Collection Time: 09/19/21 10:00 PM   Specimen: BLOOD RIGHT ARM  Result Value Ref Range Status   Specimen Description   Final    BLOOD RIGHT ARM Performed at [facility], [address] [place], [place] [address]    Special Requests   Final    BOTTLES DRAWN AEROBIC AND ANAEROBIC Blood Culture adequate  volume Performed at [facility], [address] [place], [place] [address]    Culture   Final    NO GROWTH 1 DAY Performed at [facility], [address], [place], [place] [address]    Report Status PENDING  Incomplete  Culture, blood (routine x 2) Call MD if unable to obtain prior to antibiotics being given     Status: None (Preliminary result)   Collection Time: 09/19/21 10:48 PM   Specimen: BLOOD  Result Value Ref Range Status   Specimen Description   Final    BLOOD LEFT ANTECUBITAL Performed at [facility], [address] [place], [place] [address]    Special Requests   Final    BOTTLES DRAWN AEROBIC AND ANAEROBIC Blood Culture adequate volume Performed at [facility], [address] [place], [place] [address]    Culture   Final    NO GROWTH 1 DAY Performed at [facility], [address] [place], [place] [address]    Report Status PENDING  Incomplete  MRSA Next Gen by PCR, Nasal     Status: Abnormal   Collection Time: 09/20/21  5:15 AM   Specimen: Nasal Mucosa; Nasal Swab  Result Value Ref Range Status   MRSA by PCR Next Gen DETECTED (A) NOT DETECTED Final    Comment: RESULT CALLED  TO, READ BACK BY AND VERIFIED WITH: Rex Kras RN ON 09/20/2021 @ 0817 BY MECIAL J. (NOTE) The GeneXpert MRSA Assay (FDA approved for NASAL specimens only), is one component of a comprehensive MRSA colonization surveillance program. It is not intended to diagnose MRSA infection nor to guide or monitor treatment for MRSA infections. Test performance is not FDA approved in patients less than 73 years old. Performed at [facility], [address], [place], [place] [address]   Resp Panel by RT-PCR (Flu A&B, Covid) Nasopharyngeal Swab     Status: Abnormal   Collection Time: 09/21/21  4:39 PM   Specimen: Nasopharyngeal Swab; Nasopharyngeal(NP) swabs in vial transport medium  Result Value Ref Range  Status   SARS Coronavirus 2 by RT PCR POSITIVE (A) NEGATIVE Final    Comment: RESULT CALLED TO, READ BACK BY AND VERIFIED WITH: RN K MAYNARD AT 2020 09/21/21 CRUICKSHANK A (NOTE) SARS-CoV-2 target nucleic acids are DETECTED.  The SARS-CoV-2 RNA is generally detectable in upper respiratory specimens during the acute phase of infection. Positive results are indicative of the presence of the identified virus, but do not rule out bacterial infection or co-infection with other pathogens not detected by the test. Clinical correlation with patient history and other diagnostic information is necessary to determine patient infection status. The expected result is Negative.  Fact Sheet for Patients: BloggerCourse.com  Fact Sheet for Healthcare Providers: SeriousBroker.it  This test is not yet approved or cleared by the [place] FDA and  has been authorized for detection and/or diagnosis of SARS-CoV-2 by FDA under an Emergency Use Authorization (EUA).  This EUA will remain in effect (meaning this te st can be used) for the duration of  the COVID-19 declaration under Section 564(b)(1) of the Act, 21 U.S.C. section 360bbb-3(b)(1), unless the authorization is terminated or revoked sooner.     Influenza A by PCR NEGATIVE NEGATIVE Final   Influenza B by PCR NEGATIVE NEGATIVE Final    Comment: (NOTE) The Xpert Xpress SARS-CoV-2/FLU/RSV plus assay is intended as an aid in the diagnosis of influenza from Nasopharyngeal swab specimens and should not be used as a sole basis for treatment. Nasal washings and aspirates are unacceptable for Xpert Xpress SARS-CoV-2/FLU/RSV testing.  Fact Sheet for Patients: BloggerCourse.com  Fact Sheet for Healthcare Providers: SeriousBroker.it  This test is not yet approved or cleared by the [place] FDA and has been authorized for detection and/or  diagnosis of SARS-CoV-2 by FDA under an Emergency Use Authorization (EUA). This EUA will remain in effect (meaning this test can be used) for the duration of the COVID-19 declaration under Section 564(b)(1) of the Act, 21 U.S.C. section 360bbb-3(b)(1), unless the authorization is terminated or revoked.  Performed at [facility], [address], [place], [place] [address]     Radiology Reports DG Chest 2 View  Result Date: 09/19/2021 CLINICAL DATA:  Status post fall. EXAM: CHEST - 2 VIEW COMPARISON:  August 01, 2021 FINDINGS: Chronic appearing increased lung markings are seen without evidence of acute infiltrate, pleural effusion or pneumothorax. The heart size and mediastinal contours are within normal limits. The visualized skeletal structures are unremarkable. IMPRESSION: No active cardiopulmonary disease. Electronically Signed   By: Aram Candela M.D.   On: 09/19/2021 22:15   CT HEAD WO CONTRAST ( )  Result Date: 09/19/2021 CLINICAL DATA:  Head trauma, minor (Age >= 65y) head injury Trauma. Fall off hammock.  Right upper extremity pain. EXAM: CT HEAD WITHOUT CONTRAST TECHNIQUE: Contiguous axial  images were obtained from the base of the skull through the vertex without intravenous contrast. COMPARISON:  None. FINDINGS: Brain: No acute hemorrhage. No subdural or extra-axial collection. Generalized atrophy is normal for age. There is advanced periventricular and deep white matter hypodensity, nonspecific but typically chronic small vessel ischemia. No evidence of acute infarct. No hydrocephalus, midline shift, or mass effect. Incidental mega cisterna magna. Vascular: Atherosclerosis of skullbase vasculature without hyperdense vessel or abnormal calcification. Skull: No fracture or focal lesion. Sinuses/Orbits: No acute facial bone fracture. Left nasal bone fracture appears remote. Scattered mucosal thickening of ethmoid air cells. No mastoid effusion. Left cataract  resection. Other: No confluent scalp contusion. IMPRESSION: 1. No acute intracranial abnormality. No skull fracture. 2. Advanced chronic small vessel ischemia. Electronically Signed   By: Narda Rutherford M.D.   On: 09/19/2021 20:09   CT Cervical Spine Wo Contrast  Result Date: 09/19/2021 CLINICAL DATA:  Neck trauma (Age >= 65y) fall Fall today striking head. EXAM: CT CERVICAL SPINE WITHOUT CONTRAST TECHNIQUE: Multidetector CT imaging of the cervical spine was performed without intravenous contrast. Multiplanar CT image reconstructions were also generated. COMPARISON:  None. FINDINGS: Alignment: No traumatic subluxation straightening of normal lordosis. Trace anterolisthesis of C4 on C5 and retrolisthesis of C6 on C7. Skull base and vertebrae: No acute fracture. Dens and skull base are intact. There is bony fusion of C1 in the occiput. Non fusion posterior elements of C1. Soft tissues and spinal canal: No prevertebral fluid or swelling. No visible canal hematoma. Disc levels: Degenerative disc disease at C2-C3, C5-C6 and C6-C7. Scattered facet hypertrophy. Mild disc bulge at C3-C4. No high-grade canal stenosis. Upper chest: Biapical pleuroparenchymal scarring. Emphysema. Calcified granuloma. No acute findings. Other: Carotid calcifications. IMPRESSION: Degenerative change in the cervical spine without acute fracture or subluxation. Electronically Signed   By: Narda Rutherford M.D.   On: 09/19/2021 20:06   CT ABDOMEN PELVIS W CONTRAST  Result Date: 09/19/2021 CLINICAL DATA:  Acute abdominal pain.  Fall today. EXAM: CT ABDOMEN AND PELVIS WITH CONTRAST TECHNIQUE: Multidetector CT imaging of the abdomen and pelvis was performed using the standard protocol following bolus administration of intravenous contrast. CONTRAST:  26mL OMNIPAQUE IOHEXOL 350 MG/ML SOLN COMPARISON:  None. FINDINGS: Lower chest: Tree-in-bud opacities in the left lower lobe. Coronary artery calcifications, normal heart size. Paraesophageal  varices. Hepatobiliary: Cirrhotic hepatic morphology with nodular hepatic contours. No evidence of focal hepatic lesion. Peripheral calcification in the right hepatic lobe. Intraluminal gallstones without pericholecystic inflammation. Marked dilatation of the left portal vein with large recannulated umbilical vein and abdominal collaterals. Pancreas: No ductal dilatation or inflammation. Spleen: Splenomegaly. The spleen spans 14.2 x 10.1 x 10.1 cm (volume = 758 cm^3). No focal splenic abnormality. Adrenals/Urinary Tract: No adrenal nodule. Nonobstructing stones in the lower right kidney. Punctate nonobstructing stone in the upper right kidney. No hydronephrosis. Homogeneous renal enhancement. Small cyst in the left kidney. Diminished excretion on delayed phase imaging. Distended urinary bladder with mild wall thickening. Left lateral bladder diverticulum. No perivesicular fat stranding. Stomach/Bowel: Paraesophageal and perigastric varices. Decompressed stomach. There is wall thickening of the cecum and ascending colon. Moderate colonic stool burden. No bowel obstruction. Normal appendix. Vascular/Lymphatic: Dilated but patent portal vein. Dilated left portal vein with large recannulated umbilical vein. Large varices extend into umbilical hernia. Prominent portosystemic collaterals in the abdomen and pelvis. Advanced aortic and branch atherosclerosis. No aortic aneurysm. No bulky abdominopelvic adenopathy. Reproductive: Prostate is unremarkable. Other: No ascites. No free air. Upper abdominal ventral abdominal hernia contains  collaterals. Umbilical hernia contains collaterals. Musculoskeletal: No acute fracture of the lumbar spine or pelvis. No suspicious bone abnormality. IMPRESSION: 1. Cirrhosis with portal hypertension, splenomegaly, and prominent portosystemic collaterals. No ascites. 2. Mild wall thickening of the cecum and ascending colon. This is a typical location for portal colopathy, infectious or  inflammatory colitis is not excluded. 3. Distended urinary bladder with mild wall thickening. Left bladder diverticulum. Suspect chronic bladder outlet obstruction, however recommend correlation with urinalysis to exclude urinary tract infection. 4. Cholelithiasis without gallbladder inflammation. 5. Nonobstructing right nephrolithiasis. 6. Tree-in-bud opacities in the left lower lobe, likely infectious or inflammatory, recommend correlation for aspiration risk factors. Aortic Atherosclerosis (ICD10-I70.0). Electronically Signed   By: Narda Rutherford M.D.   On: 09/19/2021 20:16    Lab Data:  CBC: Recent Labs  Lab 09/19/21 1817 09/20/21 0328  WBC 4.4 3.8*  NEUTROABS 2.8 2.4  HGB 9.6* 8.7*  HCT 29.6* 26.6*  MCV 99.3 98.9  PLT 53* 47*   Basic Metabolic Panel: Recent Labs  Lab 09/19/21 1817 09/20/21 0156 09/20/21 0328  NA 142  --  143  K 4.0  --  3.8  CL 113*  --  113*  CO2 22  --  22  GLUCOSE 100*  --  121*  BUN 25*  --  29*  CREATININE 0.95  --  1.10  CALCIUM 9.4  --  8.9  MG  --  2.0 1.9  PHOS  --  3.1 3.3   GFR: Estimated Creatinine Clearance: 58.9 mL/min (by C-G formula based on SCr of 1.1 mg/dL). Liver Function Tests: Recent Labs  Lab 09/19/21 1817 09/20/21 0328  AST 41 37  ALT 22 21  ALKPHOS 161* 129*  BILITOT 3.9* 3.1*  PROT 7.0 6.2*  ALBUMIN 2.9* 2.6*   Recent Labs  Lab 09/19/21 1817  LIPASE 38   Recent Labs  Lab 09/19/21 1817 09/20/21 0328  AMMONIA 80* 178*   Coagulation Profile: Recent Labs  Lab 09/19/21 2248  INR 1.7*   Cardiac Enzymes: Recent Labs  Lab 09/20/21 0156  CKTOTAL 59   BNP (last 3 results) No results for input(s): PROBNP in the last 8760 hours. HbA1C: No results for input(s): HGBA1C in the last 72 hours. CBG: No results for input(s): GLUCAP in the last 168 hours. Lipid Profile: No results for input(s): CHOL, HDL, LDLCALC, TRIG, CHOLHDL, LDLDIRECT in the last 72 hours. Thyroid Function Tests: Recent Labs     09/20/21 0328  TSH 3.002   Anemia Panel: No results for input(s): VITAMINB12, FOLATE, FERRITIN, TIBC, IRON, RETICCTPCT in the last 72 hours. Urine analysis:    Component Value Date/Time   COLORURINE YELLOW 09/19/2021 1919   APPEARANCEUR CLEAR 09/19/2021 1919   LABSPEC 1.015 09/19/2021 1919   PHURINE 7.5 09/19/2021 1919   GLUCOSEU NEGATIVE 09/19/2021 1919   HGBUR LARGE (A) 09/19/2021 1919   BILIRUBINUR NEGATIVE 09/19/2021 1919   KETONESUR NEGATIVE 09/19/2021 1919   PROTEINUR NEGATIVE 09/19/2021 1919   NITRITE POSITIVE (A) 09/19/2021 1919   LEUKOCYTESUR SMALL (A) 09/19/2021 1919     Johnny Dunlap M.D. [facility] 09/22/2021, 1:22 PM  Available via Epic secure chat 7am-7pm After 7 pm, please refer to night coverage provider listed on amion.

## 2021-09-22 NOTE — TOC Progression Note (Addendum)
Transition of Care [facility]) - Progression Note    Patient Details  Name: Johnny Dunlap MRN: [id number] Date of Birth: 16-Sep-1950  Transition of Care [facility]) CM/SW Contact  Darleene Cleaver, [place] Phone Number: 09/22/2021, 11:36 AM  Clinical Narrative:     [facility] number has been received.  It is [id number] H, SNF still waiting on insurance authorization.  4:00pm  CSW checked with SNF to see if they have received insurance authorization yet, per SNF they have not, it is still pending.  CSW asked if they get auth over the weekend, can patient still come and per Delorise Shiner at [facility] she said yes, and to call her at [phone].  CSW to continue to follow patient's progress throughout discharge planning.      Expected Discharge Plan and Services      [facility].     Expected Discharge Date: 09/21/21                                     Social Determinants of Health (SDOH) Interventions    Readmission Risk Interventions No flowsheet data found.

## 2021-09-22 NOTE — Discharge Summary (Signed)
Physician Discharge Summary   Patient ID: Johnny Dunlap MRN: [id number] DOB/AGE: 01-11-1950 71 y.o.  Admit date: 09/19/2021 Discharge date: 09/22/2021  Primary Care Physician:  [facility]   Recommendations for Outpatient Follow-up:  Follow up with PCP in 1-2 weeks Continue Vantin 200 mg twice a day for 4 more days Started on lactulose 45 ml/30 g total 3 times a day, titrate for 2-3 BMs in a day Started on Flomax 0.4 mg p.o. daily Doxycycline 100 mg p.o. daily for 4 more days for staff epidermidis UTI  Home Health: Patient returning to skilled nursing facility Equipment/Devices:   Discharge Condition: stable  CODE STATUS: FULL or DNR   Diet recommendation:    Discharge Diagnoses:     Acute hepatic encephalopathy-resolved  Thrombocytopenia (HCC)  Protein-calorie malnutrition, severe  Other cirrhosis of liver (HCC)  Iron deficiency anemia  COVID-19 virus infection  CAP (community acquired pneumonia)  Acute lower UTI  Acute urinary retention   Consults: None    Allergies:  No Known Allergies   DISCHARGE MEDICATIONS: Allergies as of 09/22/2021   No Known Allergies      Medication List     TAKE these medications    cefpodoxime 200 MG tablet Commonly known as: VANTIN Take 1 tablet (200 mg total) by mouth 2 (two) times daily for 4 days.   diclofenac Sodium 1 % Gel Commonly known as: VOLTAREN Apply 2 g topically 3 (three) times daily. Apply to bilateral feet   doxycycline 100 MG capsule Commonly known as: Vibramycin Take 1 capsule (100 mg total) by mouth 2 (two) times daily for 4 days.   feeding supplement Liqd Take 237 mLs by mouth 2 (two) times daily between meals.   folic acid 1 MG tablet Commonly known as: FOLVITE Take 1 tablet (1 mg total) by mouth daily.   furosemide 40 MG tablet Commonly known as: LASIX Take 1 tablet (40 mg total) by mouth daily as needed for fluid or edema.   gabapentin 400 MG capsule Commonly known as:  NEURONTIN Take 400 mg by mouth 3 (three) times daily.   lactulose 10 GM/15ML solution Commonly known as: CHRONULAC Take 45 mLs (30 g total) by mouth 3 (three) times daily.   midodrine 10 MG tablet Commonly known as: PROAMATINE Take 0.5 tablets (5 mg total) by mouth 3 (three) times daily with meals. Hold if SBP is more than 130   multivitamin with minerals Tabs tablet Take 1 tablet by mouth daily.   tamsulosin 0.4 MG Caps capsule Commonly known as: FLOMAX Take 1 capsule (0.4 mg total) by mouth daily.   thiamine 100 MG tablet Take 1 tablet (100 mg total) by mouth daily.   Vitamin D (Ergocalciferol) 1.25 MG (50000 UNIT) Caps capsule Commonly known as: DRISDOL Take 50,000 Units by mouth every 7 (seven) days. Mondays         Brief H and P: For complete details please refer to admission H and P, but in brief Patient is a 71 year old male with history of liver cirrhosis, COPD, malnutrition, dementia presented with a fall.  Larey Seat and hit his head, had a small skin tear of the left elbow.  Per SNF, patient was noted to be more confused and had been falling more often.  Patient was also reported to have brown sputum, low-grade fevers and cough.  Reported easy bleeding with minimal injury, slight amount of blood per rectum earlier last week.  Hospital Course:   Acute hepatic encephalopathy -Resolved, currently alert and oriented,  appears to be at his baseline  -Likely encephalopathy secondary to possibly due to hepatic encephalopathy, UTI and pneumonia -Continue lactulose, titrate to 2-3 BMs in a day   Dehydration with lactic acidosis -Patient was placed on gentle hydration, currently tolerating diet, IV fluids discontinued   CAP, community-acquired pneumonia -CT abdomen showed tree-in-bud pattern up in the lungs.  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   Weight as of this encounter: 65.7 kg.  Day of Discharge S: No acute complaints.  BP (!) 98/59 (BP Location: Right Arm)   Pulse 68   Temp 97.7 F (36.5 C) (Oral)   Resp 18   Ht 6' (1.829 m)   Wt 65.7 kg   SpO2 100%   BMI 19.64 kg/m    Physical Exam General: Alert and oriented x 3, NAD Cardiovascular: S1 S2 clear, RRR. No pedal edema b/l Respiratory: CTAB, no wheezing, rales or rhonchi Gastrointestinal: Soft, nontender, nondistended, NBS Ext: no pedal edema bilaterally Psych: Normal affect and demeanor, alert and oriented x3     Get Medicines reviewed and adjusted: Please take all your medications with you for your next visit with your Primary MD  Please request your Primary MD to go over all hospital tests and procedure/radiological results at the follow up. Please ask your Primary MD to get all Hospital records sent to his/her office.  If you experience worsening of your admission symptoms, develop shortness of breath, life threatening emergency, suicidal or homicidal thoughts you must seek medical attention immediately by calling  [phone] or calling your MD immediately  if symptoms less severe.  You must read complete instructions/literature along with all the possible adverse reactions/side effects for all the Medicines you take and that have been prescribed to you. Take any new Medicines after you have completely understood and accept all the possible adverse reactions/side effects.   Do not drive when taking pain medications.   Do not take more than prescribed Pain, Sleep and Anxiety Medications  Special Instructions: If you have smoked or chewed Tobacco  in the last 2 yrs please stop smoking, stop any regular Alcohol  and or any Recreational drug use.  Wear Seat belts while driving.  Please note  You were cared for by a hospitalist during your hospital stay. Once you are discharged, your primary care physician will handle any further medical issues. Please note that NO REFILLS for any discharge medications will be authorized once you are discharged, as it is imperative that you return to your primary care physician (or establish a relationship with a primary care physician if you do not have  one) for your aftercare needs so that they can reassess your need for medications and monitor your lab values.   The results of significant diagnostics from this hospitalization (including imaging, microbiology, ancillary and laboratory) are listed below for reference.      Procedures/Studies:  DG Chest 2 View  Result Date: 09/19/2021 CLINICAL DATA:  Status post fall. EXAM: CHEST - 2 VIEW COMPARISON:  August 01, 2021 FINDINGS: Chronic appearing increased lung markings are seen without evidence of acute infiltrate, pleural effusion or pneumothorax. The heart size and mediastinal contours are within normal limits. The visualized skeletal structures are unremarkable. IMPRESSION: No active cardiopulmonary disease. Electronically Signed   By: Aram Candela M.D.   On: 09/19/2021 22:15   CT HEAD WO CONTRAST ( )  Result Date:  09/19/2021 CLINICAL DATA:  Head trauma, minor (Age >= 65y) head injury Trauma. Fall off hammock.  Right upper extremity pain. EXAM: CT HEAD WITHOUT CONTRAST TECHNIQUE: Contiguous axial images were obtained from the base of the skull through the vertex without intravenous contrast. COMPARISON:  None. FINDINGS: Brain: No acute hemorrhage. No subdural or extra-axial collection. Generalized atrophy is normal for age. There is advanced periventricular and deep white matter hypodensity, nonspecific but typically chronic small vessel ischemia. No evidence of acute infarct. No hydrocephalus, midline shift, or mass effect. Incidental mega cisterna magna. Vascular: Atherosclerosis of skullbase vasculature without hyperdense vessel or abnormal calcification. Skull: No fracture or focal lesion. Sinuses/Orbits: No acute facial bone fracture. Left nasal bone fracture appears remote. Scattered mucosal thickening of ethmoid air cells. No mastoid effusion. Left cataract resection. Other: No confluent scalp contusion. IMPRESSION: 1. No acute intracranial abnormality. No skull fracture. 2. Advanced chronic small vessel ischemia. Electronically Signed   By: Narda Rutherford M.D.   On: 09/19/2021 20:09   CT Cervical Spine Wo Contrast  Result Date: 09/19/2021 CLINICAL DATA:  Neck trauma (Age >= 65y) fall Fall today striking head. EXAM: CT CERVICAL SPINE WITHOUT CONTRAST TECHNIQUE: Multidetector CT imaging of the cervical spine was performed without intravenous contrast. Multiplanar CT image reconstructions were also generated. COMPARISON:  None. FINDINGS: Alignment: No traumatic subluxation straightening of normal lordosis. Trace anterolisthesis of C4 on C5 and retrolisthesis of C6 on C7. Skull base and vertebrae: No acute fracture. Dens and skull base are intact. There is bony fusion of C1 in the occiput. Non fusion posterior elements of C1. Soft tissues and spinal canal: No prevertebral fluid or swelling. No visible canal hematoma.  Disc levels: Degenerative disc disease at C2-C3, C5-C6 and C6-C7. Scattered facet hypertrophy. Mild disc bulge at C3-C4. No high-grade canal stenosis. Upper chest: Biapical pleuroparenchymal scarring. Emphysema. Calcified granuloma. No acute findings. Other: Carotid calcifications. IMPRESSION: Degenerative change in the cervical spine without acute fracture or subluxation. Electronically Signed   By: Narda Rutherford M.D.   On: 09/19/2021 20:06   CT ABDOMEN PELVIS W CONTRAST  Result Date: 09/19/2021 CLINICAL DATA:  Acute abdominal pain.  Fall today. EXAM: CT ABDOMEN AND PELVIS WITH CONTRAST TECHNIQUE: Multidetector CT imaging of the abdomen and pelvis was performed using the standard protocol following bolus administration of intravenous contrast. CONTRAST:  76mL OMNIPAQUE IOHEXOL 350 MG/ML SOLN COMPARISON:  None. FINDINGS: Lower chest: Tree-in-bud opacities in the left lower lobe. Coronary artery calcifications, normal heart size. Paraesophageal varices. Hepatobiliary: Cirrhotic hepatic morphology with nodular hepatic contours. No evidence of focal hepatic lesion. Peripheral calcification in the right hepatic lobe. Intraluminal gallstones without pericholecystic inflammation. Marked dilatation of the left portal vein with large recannulated umbilical vein and  abdominal collaterals. Pancreas: No ductal dilatation or inflammation. Spleen: Splenomegaly. The spleen spans 14.2 x 10.1 x 10.1 cm (volume = 758 cm^3). No focal splenic abnormality. Adrenals/Urinary Tract: No adrenal nodule. Nonobstructing stones in the lower right kidney. Punctate nonobstructing stone in the upper right kidney. No hydronephrosis. Homogeneous renal enhancement. Small cyst in the left kidney. Diminished excretion on delayed phase imaging. Distended urinary bladder with mild wall thickening. Left lateral bladder diverticulum. No perivesicular fat stranding. Stomach/Bowel: Paraesophageal and perigastric varices. Decompressed stomach. There  is wall thickening of the cecum and ascending colon. Moderate colonic stool burden. No bowel obstruction. Normal appendix. Vascular/Lymphatic: Dilated but patent portal vein. Dilated left portal vein with large recannulated umbilical vein. Large varices extend into umbilical hernia. Prominent portosystemic collaterals in the abdomen and pelvis. Advanced aortic and branch atherosclerosis. No aortic aneurysm. No bulky abdominopelvic adenopathy. Reproductive: Prostate is unremarkable. Other: No ascites. No free air. Upper abdominal ventral abdominal hernia contains collaterals. Umbilical hernia contains collaterals. Musculoskeletal: No acute fracture of the lumbar spine or pelvis. No suspicious bone abnormality. IMPRESSION: 1. Cirrhosis with portal hypertension, splenomegaly, and prominent portosystemic collaterals. No ascites. 2. Mild wall thickening of the cecum and ascending colon. This is a typical location for portal colopathy, infectious or inflammatory colitis is not excluded. 3. Distended urinary bladder with mild wall thickening. Left bladder diverticulum. Suspect chronic bladder outlet obstruction, however recommend correlation with urinalysis to exclude urinary tract infection. 4. Cholelithiasis without gallbladder inflammation. 5. Nonobstructing right nephrolithiasis. 6. Tree-in-bud opacities in the left lower lobe, likely infectious or inflammatory, recommend correlation for aspiration risk factors. Aortic Atherosclerosis (ICD10-I70.0). Electronically Signed   By: Narda Rutherford M.D.   On: 09/19/2021 20:16      LAB RESULTS: Basic Metabolic Panel: Recent Labs  Lab 09/19/21 1817 09/20/21 0156 09/20/21 0328  NA 142  --  143  K 4.0  --  3.8  CL 113*  --  113*  CO2 22  --  22  GLUCOSE 100*  --  121*  BUN 25*  --  29*  CREATININE 0.95  --  1.10  CALCIUM 9.4  --  8.9  MG  --    < > 1.9  PHOS  --    < > 3.3   < > = values in this interval not displayed.   Liver Function Tests: Recent Labs   Lab 09/19/21 1817 09/20/21 0328  AST 41 37  ALT 22 21  ALKPHOS 161* 129*  BILITOT 3.9* 3.1*  PROT 7.0 6.2*  ALBUMIN 2.9* 2.6*   Recent Labs  Lab 09/19/21 1817  LIPASE 38   Recent Labs  Lab 09/19/21 1817 09/20/21 0328  AMMONIA 80* 178*   CBC: Recent Labs  Lab 09/19/21 1817 09/20/21 0328  WBC 4.4 3.8*  NEUTROABS 2.8 2.4  HGB 9.6* 8.7*  HCT 29.6* 26.6*  MCV 99.3 98.9  PLT 53* 47*   Cardiac Enzymes: Recent Labs  Lab 09/20/21 0156  CKTOTAL 59   BNP: Invalid input(s): POCBNP CBG: No results for input(s): GLUCAP in the last 168 hours.     Disposition and Follow-up: Discharge Instructions     Diet - low sodium heart healthy   Complete by: As directed    Increase activity slowly   Complete by: As directed         DISPOSITION: Skilled nursing facility   DISCHARGE FOLLOW-UP   Follow up with PCP at skilled nursing facility in 1-2 weeks   Time coordinating discharge:  25 minutes  Signed:   Thad Ranger M.D. [facility] 09/22/2021, 1:28 PM

## 2021-09-23 DIAGNOSIS — R4182 Altered mental status, unspecified: Secondary | ICD-10-CM | POA: Diagnosis not present

## 2021-09-23 DIAGNOSIS — R338 Other retention of urine: Secondary | ICD-10-CM | POA: Diagnosis not present

## 2021-09-23 DIAGNOSIS — K7200 Acute and subacute hepatic failure without coma: Secondary | ICD-10-CM | POA: Diagnosis not present

## 2021-09-23 DIAGNOSIS — N390 Urinary tract infection, site not specified: Secondary | ICD-10-CM | POA: Diagnosis not present

## 2021-09-23 NOTE — Progress Notes (Signed)
Report called to Maryruth Hancock LPN at [facility], [phone], patient will transfer via [facility] to room 131 B.

## 2021-09-23 NOTE — Plan of Care (Signed)
  Problem: Clinical Measurements: Goal: Ability to maintain clinical measurements within normal limits will improve Outcome: Progressing Goal: Respiratory complications will improve Outcome: Progressing Goal: Cardiovascular complication will be avoided Outcome: Progressing   Problem: Safety: Goal: Ability to remain free from injury will improve Outcome: Progressing   

## 2021-09-23 NOTE — TOC Transition Note (Signed)
Transition of Care [facility]) - CM/SW Discharge Note   Patient Details  Name: Johnny Dunlap MRN: [id number] Date of Birth: Nov 13, 1950  Transition of Care [facility]) CM/SW Contact:  Villa Herb, LCSWA Phone Number: 09/23/2021, 12:35 PM   Clinical Narrative:    CSW updated by Delorise Shiner with [facility] that they have received pts insurance authorization and pt can arrive to their facility today. CSW updated RN and MD. Med necessity form has been completed and D/C packet will be sent to the floor for pt. CSW to call for [facility] transport when pt is ready. TOC signing off.   Final next level of care: Skilled Nursing Facility Barriers to Discharge: Barriers Resolved   Patient Goals and CMS Choice Patient states their goals for this hospitalization and ongoing recovery are:: [facility] to [facility].gov Compare Post Acute Care list provided to:: Patient Choice offered to / list presented to : Patient  Discharge Placement                Patient to be transferred to facility by: [facility]      Discharge Plan and Services                                     Social Determinants of Health (SDOH) Interventions     Readmission Risk Interventions No flowsheet data found.

## 2021-09-23 NOTE — Progress Notes (Signed)
Phone call was placed to [facility] x 2 between the hours of 3:15-3:50 pm to give report on pt transferring from [facility], to their facility. I was unsuccessful in reaching the admitting nurse or the secretary. A message detailed message was left on phone # [phone].

## 2021-09-25 LAB — CULTURE, BLOOD (ROUTINE X 2)
Culture: NO GROWTH
Culture: NO GROWTH
Special Requests: ADEQUATE
Special Requests: ADEQUATE

## 2021-11-01 ENCOUNTER — Emergency Department (HOSPITAL_COMMUNITY): Payer: Medicare (Managed Care)

## 2021-11-01 ENCOUNTER — Emergency Department (HOSPITAL_COMMUNITY)
Admission: EM | Admit: 2021-11-01 | Discharge: 2021-11-01 | Disposition: A | Discharge status: Home / Self Care | Payer: Medicare (Managed Care) | Attending: Emergency Medicine | Admitting: Emergency Medicine

## 2021-11-01 DIAGNOSIS — X58XXXA Exposure to other specified factors, initial encounter: Secondary | ICD-10-CM | POA: Diagnosis not present

## 2021-11-01 DIAGNOSIS — J449 Chronic obstructive pulmonary disease, unspecified: Secondary | ICD-10-CM | POA: Insufficient documentation

## 2021-11-01 DIAGNOSIS — S59912A Unspecified injury of left forearm, initial encounter: Secondary | ICD-10-CM | POA: Diagnosis present

## 2021-11-01 DIAGNOSIS — S0990XA Unspecified injury of head, initial encounter: Secondary | ICD-10-CM | POA: Insufficient documentation

## 2021-11-01 DIAGNOSIS — D649 Anemia, unspecified: Secondary | ICD-10-CM | POA: Insufficient documentation

## 2021-11-01 DIAGNOSIS — R296 Repeated falls: Secondary | ICD-10-CM | POA: Diagnosis not present

## 2021-11-01 DIAGNOSIS — K7469 Other cirrhosis of liver: Secondary | ICD-10-CM | POA: Diagnosis not present

## 2021-11-01 DIAGNOSIS — Z8616 Personal history of COVID-19: Secondary | ICD-10-CM | POA: Insufficient documentation

## 2021-11-01 DIAGNOSIS — F17210 Nicotine dependence, cigarettes, uncomplicated: Secondary | ICD-10-CM | POA: Insufficient documentation

## 2021-11-01 DIAGNOSIS — R531 Weakness: Secondary | ICD-10-CM | POA: Diagnosis present

## 2021-11-01 DIAGNOSIS — S51812A Laceration without foreign body of left forearm, initial encounter: Secondary | ICD-10-CM | POA: Insufficient documentation

## 2021-11-01 DIAGNOSIS — K7460 Unspecified cirrhosis of liver: Secondary | ICD-10-CM

## 2021-11-01 LAB — CBC WITH DIFFERENTIAL/PLATELET
Abs Immature Granulocytes: 0.02 10*3/uL (ref 0.00–0.07)
Basophils Absolute: 0 10*3/uL (ref 0.0–0.1)
Basophils Relative: 1 %
Eosinophils Absolute: 0.2 10*3/uL (ref 0.0–0.5)
Eosinophils Relative: 7 %
HCT: 29.3 % — ABNORMAL LOW (ref 39.0–52.0)
Hemoglobin: 9.8 g/dL — ABNORMAL LOW (ref 13.0–17.0)
Immature Granulocytes: 1 %
Lymphocytes Relative: 18 %
Lymphs Abs: 0.5 10*3/uL — ABNORMAL LOW (ref 0.7–4.0)
MCH: 33.7 pg (ref 26.0–34.0)
MCHC: 33.4 g/dL (ref 30.0–36.0)
MCV: 100.7 fL — ABNORMAL HIGH (ref 80.0–100.0)
Monocytes Absolute: 0.4 10*3/uL (ref 0.1–1.0)
Monocytes Relative: 14 %
Neutro Abs: 1.7 10*3/uL (ref 1.7–7.7)
Neutrophils Relative %: 59 %
Platelets: 47 10*3/uL — ABNORMAL LOW (ref 150–400)
RBC: 2.91 MIL/uL — ABNORMAL LOW (ref 4.22–5.81)
RDW: 15.9 % — ABNORMAL HIGH (ref 11.5–15.5)
WBC: 2.8 10*3/uL — ABNORMAL LOW (ref 4.0–10.5)
nRBC: 0 % (ref 0.0–0.2)

## 2021-11-01 LAB — COMPREHENSIVE METABOLIC PANEL
ALT: 20 U/L (ref 0–44)
AST: 40 U/L (ref 15–41)
Albumin: 2.9 g/dL — ABNORMAL LOW (ref 3.5–5.0)
Alkaline Phosphatase: 145 U/L — ABNORMAL HIGH (ref 38–126)
Anion gap: 5 (ref 5–15)
BUN: 20 mg/dL (ref 8–23)
CO2: 24 mmol/L (ref 22–32)
Calcium: 9.1 mg/dL (ref 8.9–10.3)
Chloride: 110 mmol/L (ref 98–111)
Creatinine, Ser: 0.92 mg/dL (ref 0.61–1.24)
GFR, Estimated: >60 mL/min (ref >60)
Glucose, Bld: 100 mg/dL — ABNORMAL HIGH (ref 70–99)
Potassium: 3.9 mmol/L (ref 3.5–5.1)
Sodium: 139 mmol/L (ref 135–145)
Total Bilirubin: 3.4 mg/dL — ABNORMAL HIGH (ref 0.3–1.2)
Total Protein: 6.6 g/dL (ref 6.5–8.1)

## 2021-11-01 LAB — PROTIME-INR
INR: 1.7 — ABNORMAL HIGH (ref 0.8–1.2)
Prothrombin Time: 19.6 seconds — ABNORMAL HIGH (ref 11.4–15.2)

## 2021-11-01 LAB — AMMONIA: Ammonia: 106 umol/L — ABNORMAL HIGH (ref 9–35)

## 2021-11-01 LAB — TSH: TSH: 1.873 u[IU]/mL (ref 0.350–4.500)

## 2021-11-01 LAB — PHOSPHORUS: Phosphorus: 4.1 mg/dL (ref 2.5–4.6)

## 2021-11-01 LAB — MAGNESIUM: Magnesium: 1.8 mg/dL (ref 1.7–2.4)

## 2021-11-01 MED ORDER — LACTATED RINGERS IV BOLUS
500.0000 mL | Freq: Once | INTRAVENOUS | Status: AC
  Administered 2021-11-01: 500 mL via INTRAVENOUS

## 2021-11-01 NOTE — ED Notes (Signed)
Attempted report to [facility], Call transferred but no answer

## 2021-11-01 NOTE — ED Triage Notes (Signed)
The patient is from [place]. Yesterday he had blood drawn for labs. After he had a fall and has been confused. EMS reports he has suffered from weakness all weekend. WBC, RBC, hemoglobin and hematocrit were all low. Patient is currently A&O x 4    EMS vitals: 106/60 BP 70 HR 20 RR 99% SPO2 109 CBG

## 2021-11-01 NOTE — ED Notes (Signed)
Report given to [facility] for pt to be transported back to [facility]. RN attempted to given facility report x2. RN unable to give report due to no one picking up phone.

## 2021-11-01 NOTE — ED Provider Notes (Signed)
[facility] DEPT Provider Note   CSN: [id number] Arrival date & time: 11/01/21  1108     History No chief complaint on file.   Johnny Dunlap is a 71 y.o. male.  HPI Patient presents for a concern of abnormal lab finding.  Currently, he lives in [place] skilled nursing facility.  He has history of cirrhosis.  He denies any acute symptoms.  He does state that he has had ongoing difficulty concentrating over the past several months.  He is unsure of what lab work the facility had concerns about.  At baseline, he has low platelets, due to his cirrhosis.  He denies any recent bleeding.  He denies any current areas of pain or discomfort.  He receives most of his primary care through the [facility] hospital in [place].  On arrival, patient requesting something to eat.  At baseline, he gets around utilizing walker and/or wheelchair.  He has had some recent falls but denies any suspected injuries.  Currently, he denies any headache, vision changes, areas of numbness or weakness.    Past Medical History:  Diagnosis Date   Cirrhosis ([place])    COPD (chronic obstructive pulmonary disease) ([place])     Patient Active Problem List   Diagnosis Date Noted   Acute hepatic encephalopathy 09/19/2021   CAP (community acquired pneumonia) 09/19/2021   Acute lower UTI 09/19/2021   Acute urinary retention 09/19/2021   Protein-calorie malnutrition, severe 08/11/2021   Sepsis ([place]) 08/01/2021   Left arm cellulitis    Lactic acidosis    Other cirrhosis of liver (HCC)    Thrombocytopenia ([place])    COVID-19 virus infection    Iron deficiency anemia    Hypokalemia    GI bleed 07/23/2021    Past Surgical History:  Procedure Laterality Date   MOUTH SURGERY     NO PAST SURGERIES         Family History  Problem Relation Age of Onset   CAD Father    CAD Sister     Social History   Tobacco Use   Smoking status: Some Days    Types: Cigarettes   Smokeless tobacco: Never   Substance Use Topics   Alcohol use: Not Currently   Drug use: Never    Home Medications Prior to Admission medications   Medication Sig Start Date End Date Taking? Authorizing Provider  diclofenac Sodium (VOLTAREN) 1 % GEL Apply 2 g topically 3 (three) times daily. Apply to bilateral feet 09/21/21  Yes Rai, Ripudeep K, MD  feeding supplement (ENSURE ENLIVE / ENSURE PLUS) LIQD Take 237 mLs by mouth 2 (two) times daily between meals. 08/11/21  Yes Ghimire, Henreitta Leber, MD  folic acid (FOLVITE) 1 MG tablet Take 1 tablet (1 mg total) by mouth daily. 09/22/21  Yes Rai, Ripudeep K, MD  furosemide (LASIX) 40 MG tablet Take 1 tablet (40 mg total) by mouth daily as needed for fluid or edema. 08/11/21  Yes Ghimire, Henreitta Leber, MD  gabapentin (NEURONTIN) 400 MG capsule Take 400 mg by mouth 3 (three) times daily.   Yes [provider]  lactulose (CHRONULAC) 10 GM/15ML solution Take 45 mLs (30 g total) by mouth 3 (three) times daily. 09/21/21  Yes Rai, Ripudeep K, MD  Multiple Vitamin (MULTIVITAMIN WITH MINERALS) TABS tablet Take 1 tablet by mouth daily. 08/09/21  Yes Ghimire, Henreitta Leber, MD  tamsulosin (FLOMAX) 0.4 MG CAPS capsule Take 1 capsule (0.4 mg total) by mouth daily. 09/21/21  Yes Rai, Ripudeep K,  MD  thiamine 100 MG tablet Take 1 tablet (100 mg total) by mouth daily. 09/22/21  Yes Rai, Ripudeep K, MD  Vitamin D, Ergocalciferol, (DRISDOL) 1.25 MG (50000 UNIT) CAPS capsule Take 50,000 Units by mouth every 7 (seven) days. Mondays   Yes [provider]  midodrine (PROAMATINE) 10 MG tablet Take 0.5 tablets (5 mg total) by mouth 3 (three) times daily with meals. Hold if SBP is more than 130 08/11/21   Ghimire, Werner Lean, MD    Allergies    Patient has no known allergies.  Review of Systems   Review of Systems  Constitutional:  Negative for activity change, chills, fatigue and fever.  HENT:  Negative for congestion, ear pain and sore throat.   Eyes:  Negative for pain and visual  disturbance.  Respiratory:  Negative for cough, chest tightness and shortness of breath.   Cardiovascular:  Negative for chest pain and palpitations.  Gastrointestinal:  Negative for abdominal pain, blood in stool, diarrhea, nausea and vomiting.  Genitourinary:  Negative for dysuria, flank pain and hematuria.  Musculoskeletal:  Negative for arthralgias, back pain, myalgias and neck pain.  Skin:  Negative for color change and rash.  Neurological:  Negative for dizziness, seizures, syncope, speech difficulty, weakness, light-headedness, numbness and headaches.  Psychiatric/Behavioral:  Positive for decreased concentration (Chronic). Negative for dysphoric mood and hallucinations.   All other systems reviewed and are negative.  Physical Exam Updated Vital Signs BP 105/62 (BP Location: Left Arm)   Pulse 64   Temp 98.2 F (36.8 C) (Oral)   Resp 18   Ht 6' (1.829 m)   Wt 68 kg   SpO2 98%   BMI 20.34 kg/m   Physical Exam Vitals and nursing note reviewed.  Constitutional:      General: He is not in acute distress.    Appearance: Normal appearance. He is well-developed and normal weight. He is ill-appearing (Chronically). He is not toxic-appearing or diaphoretic.  HENT:     Head: Normocephalic and atraumatic.     Right Ear: External ear normal.     Left Ear: External ear normal.     Nose: Nose normal.     Mouth/Throat:     Mouth: Mucous membranes are moist.     Pharynx: Oropharynx is clear.  Eyes:     General: No scleral icterus.    Extraocular Movements: Extraocular movements intact.     Conjunctiva/sclera: Conjunctivae normal.  Cardiovascular:     Rate and Rhythm: Normal rate and regular rhythm.     Heart sounds: No murmur heard. Pulmonary:     Effort: Pulmonary effort is normal. No respiratory distress.     Breath sounds: Normal breath sounds. No wheezing or rales.  Abdominal:     General: Abdomen is flat.     Palpations: Abdomen is soft.     Tenderness: There is no  abdominal tenderness.  Musculoskeletal:        General: Normal range of motion.     Cervical back: Normal range of motion and neck supple. No rigidity.     Right lower leg: No edema.     Left lower leg: No edema.  Skin:    General: Skin is warm and dry.     Findings: No bruising.     Comments: Small skin tear to distal left forearm, hemostatic.  Neurological:     General: No focal deficit present.     Mental Status: He is alert and oriented to person, place, and time.  Cranial Nerves: No cranial nerve deficit.     Sensory: No sensory deficit.     Motor: No weakness.     Comments: No asterixis  Psychiatric:        Mood and Affect: Mood normal.        Behavior: Behavior normal.        Thought Content: Thought content normal.        Judgment: Judgment normal.    ED Results / Procedures / Treatments   Labs (all labs ordered are listed, but only abnormal results are displayed) Labs Reviewed  CBC WITH DIFFERENTIAL/PLATELET - Abnormal; Notable for the following components:      Result Value   WBC 2.8 (*)    RBC 2.91 (*)    Hemoglobin 9.8 (*)    HCT 29.3 (*)    MCV 100.7 (*)    RDW 15.9 (*)    Platelets 47 (*)    Lymphs Abs 0.5 (*)    All other components within normal limits  COMPREHENSIVE METABOLIC PANEL - Abnormal; Notable for the following components:   Glucose, Bld 100 (*)    Albumin 2.9 (*)    Alkaline Phosphatase 145 (*)    Total Bilirubin 3.4 (*)    All other components within normal limits  PROTIME-INR - Abnormal; Notable for the following components:   Prothrombin Time 19.6 (*)    INR 1.7 (*)    All other components within normal limits  AMMONIA - Abnormal; Notable for the following components:   Ammonia 106 (*)    All other components within normal limits  MAGNESIUM  PHOSPHORUS  TSH  URINALYSIS, ROUTINE W REFLEX MICROSCOPIC    EKG EKG Interpretation  Date/Time:  Wednesday November 01 2021 11:38:53 EDT Ventricular Rate:  67 PR Interval:  185 QRS  Duration: 110 QT Interval:  465 QTC Calculation: 491 R Axis:   78 Text Interpretation: Sinus rhythm RSR' in V1 or V2, right VCD or RVH Borderline prolonged QT interval Confirmed by Godfrey Pick ([id number]) on 11/01/2021 11:53:18 AM  Radiology CT Head Wo Contrast  Result Date: 11/01/2021 CLINICAL DATA:  Head trauma, mod-severe EXAM: CT HEAD WITHOUT CONTRAST TECHNIQUE: Contiguous axial images were obtained from the base of the skull through the vertex without intravenous contrast. COMPARISON:  Head CT 09/19/2021 FINDINGS: Brain: No evidence of acute intracranial hemorrhage or extra-axial collection. Unchanged punctate calcification along the aspect of the left frontal sulcus.No evidence of mass lesion/concern mass effect.The ventricles are unchanged in size.Confluent periventricular and subcortical white matter hypoattenuation, which is nonspecific but likely sequela of chronic small vessel ischemic disease, unchanged.Mild cerebral atrophy Vascular: No hyperdense vessel or unexpected calcification. Skull: Internal carotid and vertebral artery calcifications. Sinuses/Orbits: Ethmoid air cell mucosal thickening. Other: None. IMPRESSION: No acute intracranial abnormality. Unchanged advanced chronic small vessel ischemic disease. Electronically Signed   By: Maurine Simmering M.D.   On: 11/01/2021 12:46    Procedures Procedures   Medications Ordered in ED Medications  lactated ringers bolus 500 mL (0 mLs Intravenous Stopped 11/01/21 1349)    ED Course  I have reviewed the triage vital signs and the nursing notes.  Pertinent labs & imaging results that were available during my care of the patient were reviewed by me and considered in my medical decision making (see chart for details).    MDM Rules/Calculators/A&P                          Patient is a  71 year old male with history of COPD and cirrhosis, presenting from [facility] facility for concerns of recent lab work.  On arrival, he is  alert and oriented.  Vital signs are normal.  He denies any current or recent symptoms.  He is unaware of what lab work the facility was concerned about. Lab work-up was initiated.  Given that he has had falls and is at risk for bleeding, CT of head was ordered.  Results showed no acute intracranial abnormalities.  Given his absence of any areas of pain, no further imaging indicated.  Results of lab work showed baseline leukopenia, thrombocytopenia, albuminemia, bilirubinemia, anemia, hyperammonemia, and elevated INR, all consistent with his cirrhosis.  I did speak with nursing staff at his facility.  She states that the lab findings of concern was a thrombocytopenia.  Given that this is a chronic condition and patient does not have any current or recent bleeding, no further work-up or treatment is indicated at this time.  Patient was advised to follow-up with his outpatient providers and to return to the ED for any new symptoms or any persistent bleeding.  He was discharged in stable condition.  Final Clinical Impression(s) / ED Diagnoses Final diagnoses:  Hepatic cirrhosis, unspecified hepatic cirrhosis type, unspecified whether ascites present [facility])    Rx / DC Orders ED Discharge Orders     None        Godfrey Pick, MD 11/01/21 1438

## 2021-12-05 ENCOUNTER — Emergency Department (HOSPITAL_COMMUNITY): Payer: Medicare (Managed Care)

## 2021-12-05 ENCOUNTER — Observation Stay (HOSPITAL_COMMUNITY)
Admission: EM | Admit: 2021-12-05 | Discharge: 2021-12-14 | Disposition: A | Discharge status: Skilled Nursing Facility | Payer: Medicare (Managed Care) | Attending: Internal Medicine | Admitting: Internal Medicine

## 2021-12-05 ENCOUNTER — Encounter (HOSPITAL_COMMUNITY): Payer: Self-pay

## 2021-12-05 ENCOUNTER — Other Ambulatory Visit: Payer: Self-pay

## 2021-12-05 DIAGNOSIS — D696 Thrombocytopenia, unspecified: Secondary | ICD-10-CM | POA: Diagnosis not present

## 2021-12-05 DIAGNOSIS — R1310 Dysphagia, unspecified: Secondary | ICD-10-CM | POA: Insufficient documentation

## 2021-12-05 DIAGNOSIS — R531 Weakness: Secondary | ICD-10-CM | POA: Diagnosis present

## 2021-12-05 DIAGNOSIS — K7682 Hepatic encephalopathy: Secondary | ICD-10-CM

## 2021-12-05 DIAGNOSIS — Z79899 Other long term (current) drug therapy: Secondary | ICD-10-CM | POA: Diagnosis not present

## 2021-12-05 DIAGNOSIS — R52 Pain, unspecified: Secondary | ICD-10-CM

## 2021-12-05 DIAGNOSIS — E43 Unspecified severe protein-calorie malnutrition: Secondary | ICD-10-CM

## 2021-12-05 DIAGNOSIS — Z8616 Personal history of COVID-19: Secondary | ICD-10-CM | POA: Diagnosis not present

## 2021-12-05 DIAGNOSIS — K7469 Other cirrhosis of liver: Secondary | ICD-10-CM | POA: Diagnosis not present

## 2021-12-05 DIAGNOSIS — J449 Chronic obstructive pulmonary disease, unspecified: Secondary | ICD-10-CM | POA: Diagnosis not present

## 2021-12-05 DIAGNOSIS — D509 Iron deficiency anemia, unspecified: Secondary | ICD-10-CM | POA: Diagnosis present

## 2021-12-05 DIAGNOSIS — R2681 Unsteadiness on feet: Secondary | ICD-10-CM | POA: Diagnosis not present

## 2021-12-05 DIAGNOSIS — F17210 Nicotine dependence, cigarettes, uncomplicated: Secondary | ICD-10-CM | POA: Insufficient documentation

## 2021-12-05 LAB — COMPREHENSIVE METABOLIC PANEL
ALT: 23 U/L (ref 0–44)
AST: 37 U/L (ref 15–41)
Albumin: 2.9 g/dL — ABNORMAL LOW (ref 3.5–5.0)
Alkaline Phosphatase: 148 U/L — ABNORMAL HIGH (ref 38–126)
Anion gap: 8 (ref 5–15)
BUN: 20 mg/dL (ref 8–23)
CO2: 21 mmol/L — ABNORMAL LOW (ref 22–32)
Calcium: 9.3 mg/dL (ref 8.9–10.3)
Chloride: 109 mmol/L (ref 98–111)
Creatinine, Ser: 1.11 mg/dL (ref 0.61–1.24)
GFR, Estimated: >60 mL/min (ref >60)
Glucose, Bld: 56 mg/dL — ABNORMAL LOW (ref 70–99)
Potassium: 4.1 mmol/L (ref 3.5–5.1)
Sodium: 138 mmol/L (ref 135–145)
Total Bilirubin: 3.4 mg/dL — ABNORMAL HIGH (ref 0.3–1.2)
Total Protein: 6.8 g/dL (ref 6.5–8.1)

## 2021-12-05 LAB — CBC WITH DIFFERENTIAL/PLATELET
Abs Immature Granulocytes: 0.01 10*3/uL (ref 0.00–0.07)
Basophils Absolute: 0.1 10*3/uL (ref 0.0–0.1)
Basophils Relative: 1 %
Eosinophils Absolute: 0.4 10*3/uL (ref 0.0–0.5)
Eosinophils Relative: 8 %
HCT: 33.3 % — ABNORMAL LOW (ref 39.0–52.0)
Hemoglobin: 10.8 g/dL — ABNORMAL LOW (ref 13.0–17.0)
Immature Granulocytes: 0 %
Lymphocytes Relative: 19 %
Lymphs Abs: 0.9 10*3/uL (ref 0.7–4.0)
MCH: 32.8 pg (ref 26.0–34.0)
MCHC: 32.4 g/dL (ref 30.0–36.0)
MCV: 101.2 fL — ABNORMAL HIGH (ref 80.0–100.0)
Monocytes Absolute: 0.6 10*3/uL (ref 0.1–1.0)
Monocytes Relative: 13 %
Neutro Abs: 2.7 10*3/uL (ref 1.7–7.7)
Neutrophils Relative %: 59 %
Platelets: 54 10*3/uL — ABNORMAL LOW (ref 150–400)
RBC: 3.29 MIL/uL — ABNORMAL LOW (ref 4.22–5.81)
RDW: 15.8 % — ABNORMAL HIGH (ref 11.5–15.5)
WBC: 4.5 10*3/uL (ref 4.0–10.5)
nRBC: 0 % (ref 0.0–0.2)

## 2021-12-05 LAB — CBG MONITORING, ED
Glucose-Capillary: 67 mg/dL — ABNORMAL LOW (ref 70–99)
Glucose-Capillary: 110 mg/dL — ABNORMAL HIGH (ref 70–99)

## 2021-12-05 LAB — AMMONIA: Ammonia: 113 umol/L — ABNORMAL HIGH (ref 9–35)

## 2021-12-05 MED ORDER — ACETAMINOPHEN 650 MG RE SUPP: 650.0000 mg | Freq: Four times a day (QID) | RECTAL | Status: DC | PRN

## 2021-12-05 MED ORDER — TAMSULOSIN HCL 0.4 MG PO CAPS
0.4000 mg | ORAL_CAPSULE | Freq: Every day | ORAL | Status: DC
  Administered 2021-12-06 – 2021-12-14 (×9): 0.4 mg via ORAL
  Filled 2021-12-05 (×10): qty 1

## 2021-12-05 MED ORDER — ACETAMINOPHEN 325 MG PO TABS
650.0000 mg | ORAL_TABLET | Freq: Four times a day (QID) | ORAL | Status: DC | PRN
  Administered 2021-12-06 – 2021-12-12 (×2): 650 mg via ORAL
  Filled 2021-12-05 (×3): qty 2

## 2021-12-05 MED ORDER — FUROSEMIDE 40 MG PO TABS: 40.0000 mg | ORAL_TABLET | Freq: Every day | ORAL | Status: DC | PRN

## 2021-12-05 MED ORDER — SODIUM CHLORIDE 0.9 % IV BOLUS
1000.0000 mL | Freq: Once | INTRAVENOUS | Status: AC
  Administered 2021-12-05: 1000 mL via INTRAVENOUS

## 2021-12-05 MED ORDER — FOLIC ACID 1 MG PO TABS
1.0000 mg | ORAL_TABLET | Freq: Every day | ORAL | Status: DC
  Administered 2021-12-06 – 2021-12-14 (×9): 1 mg via ORAL
  Filled 2021-12-05 (×9): qty 1

## 2021-12-05 MED ORDER — THIAMINE HCL 100 MG PO TABS
100.0000 mg | ORAL_TABLET | Freq: Every day | ORAL | Status: DC
  Administered 2021-12-06 – 2021-12-14 (×10): 100 mg via ORAL
  Filled 2021-12-05 (×10): qty 1

## 2021-12-05 MED ORDER — ENSURE ENLIVE PO LIQD
237.0000 mL | Freq: Two times a day (BID) | ORAL | Status: DC
  Administered 2021-12-06 – 2021-12-13 (×16): 237 mL via ORAL
  Filled 2021-12-05 (×2): qty 237

## 2021-12-05 MED ORDER — MIDODRINE HCL 5 MG PO TABS
5.0000 mg | ORAL_TABLET | Freq: Three times a day (TID) | ORAL | Status: DC
  Administered 2021-12-06 – 2021-12-14 (×25): 5 mg via ORAL
  Filled 2021-12-05 (×25): qty 1

## 2021-12-05 MED ORDER — ADULT MULTIVITAMIN W/MINERALS CH
1.0000 | ORAL_TABLET | Freq: Every day | ORAL | Status: DC
  Administered 2021-12-06 – 2021-12-14 (×9): 1 via ORAL
  Filled 2021-12-05 (×9): qty 1

## 2021-12-05 MED ORDER — SODIUM CHLORIDE 0.9 % IV SOLN: INTRAVENOUS | Status: AC

## 2021-12-05 MED ORDER — LACTULOSE 10 GM/15ML PO SOLN
30.0000 g | Freq: Once | ORAL | Status: AC
  Administered 2021-12-05: 30 g via ORAL
  Filled 2021-12-05: qty 60

## 2021-12-05 MED ORDER — LACTULOSE 10 GM/15ML PO SOLN
30.0000 g | Freq: Three times a day (TID) | ORAL | Status: DC
  Administered 2021-12-06 – 2021-12-14 (×24): 30 g via ORAL
  Filled 2021-12-05 (×23): qty 45
  Filled 2021-12-05: qty 60
  Filled 2021-12-05 (×2): qty 45

## 2021-12-05 MED ORDER — SODIUM CHLORIDE 0.9% FLUSH
3.0000 mL | Freq: Two times a day (BID) | INTRAVENOUS | Status: DC
  Administered 2021-12-06 – 2021-12-14 (×17): 3 mL via INTRAVENOUS

## 2021-12-05 NOTE — ED Provider Notes (Signed)
[facility] EMERGENCY DEPARTMENT Provider Note   CSN: [id number] Arrival date & time: 12/05/21  1506     History Chief Complaint  Patient presents with   Dizziness    Johnny Dunlap is a 71 y.o. male history of cirrhosis, COPD, here presenting with dizziness and weakness.  Patient is currently at [facility].  Patient states that he has not been eating and drinking much.  He has been feeling lightheaded and dizzy.  He states that it is hard for him to walk.  He also has generalized weakness as well.  Patient has known cirrhosis.  Patient was seen in the ED about a month ago for similar symptoms.  His ammonia was elevated at that time.  He was sent back to the facility.   The history is provided by the patient.      Past Medical History:  Diagnosis Date   Cirrhosis (HCC)    COPD (chronic obstructive pulmonary disease) (HCC)     Patient Active Problem List   Diagnosis Date Noted   Acute hepatic encephalopathy 09/19/2021   CAP (community acquired pneumonia) 09/19/2021   Acute lower UTI 09/19/2021   Acute urinary retention 09/19/2021   Protein-calorie malnutrition, severe 08/11/2021   Sepsis (HCC) 08/01/2021   Left arm cellulitis    Lactic acidosis    Other cirrhosis of liver (HCC)    Thrombocytopenia (HCC)    COVID-19 virus infection    Iron deficiency anemia    Hypokalemia    GI bleed 07/23/2021    Past Surgical History:  Procedure Laterality Date   MOUTH SURGERY     NO PAST SURGERIES         Family History  Problem Relation Age of Onset   CAD Father    CAD Sister     Social History   Tobacco Use   Smoking status: Some Days    Types: Cigarettes   Smokeless tobacco: Never  Substance Use Topics   Alcohol use: Not Currently   Drug use: Never    Home Medications Prior to Admission medications   Medication Sig Start Date End Date Taking? Authorizing Provider  diclofenac Sodium (VOLTAREN) 1 % GEL Apply 2 g topically 3 (three) times  daily. Apply to bilateral feet 09/21/21   Rai, Ripudeep K, MD  feeding supplement (ENSURE ENLIVE / ENSURE PLUS) LIQD Take 237 mLs by mouth 2 (two) times daily between meals. 08/11/21   Ghimire, Werner Lean, MD  folic acid (FOLVITE) 1 MG tablet Take 1 tablet (1 mg total) by mouth daily. 09/22/21   Rai, Delene Ruffini, MD  furosemide (LASIX) 40 MG tablet Take 1 tablet (40 mg total) by mouth daily as needed for fluid or edema. 08/11/21   Ghimire, Werner Lean, MD  gabapentin (NEURONTIN) 400 MG capsule Take 400 mg by mouth 3 (three) times daily.    [provider]  lactulose (CHRONULAC) 10 GM/15ML solution Take 45 mLs (30 g total) by mouth 3 (three) times daily. 09/21/21   Rai, Delene Ruffini, MD  midodrine (PROAMATINE) 10 MG tablet Take 0.5 tablets (5 mg total) by mouth 3 (three) times daily with meals. Hold if SBP is more than 130 08/11/21   Ghimire, Werner Lean, MD  Multiple Vitamin (MULTIVITAMIN WITH MINERALS) TABS tablet Take 1 tablet by mouth daily. 08/09/21   Ghimire, Werner Lean, MD  tamsulosin (FLOMAX) 0.4 MG CAPS capsule Take 1 capsule (0.4 mg total) by mouth daily. 09/21/21   Cathren Harsh, MD  thiamine 100  MG tablet Take 1 tablet (100 mg total) by mouth daily. 09/22/21   Rai, Delene Ruffini, MD  Vitamin D, Ergocalciferol, (DRISDOL) 1.25 MG (50000 UNIT) CAPS capsule Take 50,000 Units by mouth every 7 (seven) days. Mondays    [provider]    Allergies    Patient has no known allergies.  Review of Systems   Review of Systems  Neurological:  Positive for dizziness.  All other systems reviewed and are negative.  Physical Exam Updated Vital Signs BP (!) 108/92   Pulse 61   Temp (!) 97.5 F (36.4 C) (Oral)   Resp 18   Ht 5\' 11"  (1.803 m)   Wt 68 kg   SpO2 100%   BMI 20.92 kg/m   Physical Exam Vitals and nursing note reviewed.  Constitutional:      Comments: Some tremors  HENT:     Head: Normocephalic.     Nose: Nose normal.     Mouth/Throat:     Mouth: Mucous membranes are dry.   Eyes:     Extraocular Movements: Extraocular movements intact.     Pupils: Pupils are equal, round, and reactive to light.  Cardiovascular:     Rate and Rhythm: Normal rate and regular rhythm.     Pulses: Normal pulses.     Heart sounds: Normal heart sounds.  Pulmonary:     Effort: Pulmonary effort is normal.     Breath sounds: Normal breath sounds.  Abdominal:     General: Abdomen is flat.     Palpations: Abdomen is soft.  Musculoskeletal:        General: Normal range of motion.     Cervical back: Normal range of motion and neck supple.  Skin:    General: Skin is warm.     Capillary Refill: Capillary refill takes less than 2 seconds.  Neurological:     General: No focal deficit present.     Mental Status: He is oriented to person, place, and time.     Comments: Patient has some resting tremors.  Patient does have mild asterixis as well  Psychiatric:        Mood and Affect: Mood normal.        Behavior: Behavior normal.    ED Results / Procedures / Treatments   Labs (all labs ordered are listed, but only abnormal results are displayed) Labs Reviewed  CBC WITH DIFFERENTIAL/PLATELET - Abnormal; Notable for the following components:      Result Value   RBC 3.29 (*)    Hemoglobin 10.8 (*)    HCT 33.3 (*)    MCV 101.2 (*)    RDW 15.8 (*)    Platelets 54 (*)    All other components within normal limits  COMPREHENSIVE METABOLIC PANEL - Abnormal; Notable for the following components:   CO2 21 (*)    Glucose, Bld 56 (*)    Albumin 2.9 (*)    Alkaline Phosphatase 148 (*)    Total Bilirubin 3.4 (*)    All other components within normal limits  AMMONIA - Abnormal; Notable for the following components:   Ammonia 113 (*)    All other components within normal limits  CBG MONITORING, ED - Abnormal; Notable for the following components:   Glucose-Capillary 67 (*)    All other components within normal limits  URINALYSIS, ROUTINE W REFLEX MICROSCOPIC  CBG MONITORING, ED     EKG None  Radiology CT Head Wo Contrast  Result Date: 12/05/2021 CLINICAL DATA:  Dizziness EXAM: CT HEAD WITHOUT CONTRAST TECHNIQUE: Contiguous axial images were obtained from the base of the skull through the vertex without intravenous contrast. COMPARISON:  11/01/2021 FINDINGS: Brain: No evidence of acute infarction, hemorrhage, hydrocephalus, extra-axial collection or mass lesion/mass effect. Extensive low-density changes within the periventricular and subcortical white matter compatible with chronic microvascular ischemic change. Mild diffuse cerebral volume loss. Vascular: Atherosclerotic calcifications involving the large vessels of the skull base. No unexpected hyperdense vessel. Skull: Normal. Negative for fracture or focal lesion. Sinuses/Orbits: Similar mucosal thickening within the bilateral ethmoid air cells. No acute findings. Other: None. IMPRESSION: 1. No acute intracranial findings. 2. Stable advanced chronic microvascular ischemic change. Electronically Signed   By: Duanne Guess D.O.   On: 12/05/2021 16:12    Procedures Procedures   CRITICAL CARE Performed by: Richardean Canal   Total critical care time:30 minutes  Critical care time was exclusive of separately billable procedures and treating other patients.  Critical care was necessary to treat or prevent imminent or life-threatening deterioration.  Critical care was time spent personally by me on the following activities: development of treatment plan with patient and/or surrogate as well as nursing, discussions with consultants, evaluation of patient's response to treatment, examination of patient, obtaining history from patient or surrogate, ordering and performing treatments and interventions, ordering and review of laboratory studies, ordering and review of radiographic studies, pulse oximetry and re-evaluation of patient's condition.   Medications Ordered in ED Medications  lactulose (CHRONULAC) 10 GM/15ML  solution 30 g (has no administration in time range)  sodium chloride 0.9 % bolus 1,000 mL (has no administration in time range)    ED Course  I have reviewed the triage vital signs and the nursing notes.  Pertinent labs & imaging results that were available during my care of the patient were reviewed by me and considered in my medical decision making (see chart for details).    MDM Rules/Calculators/A&P                           Johnny Dunlap is a 71 y.o. male here presenting with weakness and dizziness. Symptoms for several weeks and progressively getting worse. Patient appears dehydrated. Has hx of hepatic encephalopathy so will get ammonia level. Consider subdural hemorrhage given recurrent falls so will get CT head. Will get labs as well   6:16 PM Glucose was 67, given juice and improved. Ammonia level is 113 and CT head unremarkable. Given lactulose and will admit for hepatic  encephalopathy   Final Clinical Impression(s) / ED Diagnoses Final diagnoses:  None    Rx / DC Orders ED Discharge Orders     None        Charlynne Pander, MD 12/05/21 2330

## 2021-12-05 NOTE — ED Provider Notes (Signed)
Emergency Medicine Provider Triage Evaluation Note  Johnny Dunlap , a 71 y.o. male  was evaluated in triage.  Pt complains of dizziness and generalized weakness.  Patient states dizziness has occurred for the past few months however, worsened over the past few days.  Patient describes dizziness as feeling off balance.  Patient admits to decreased p.o. intake over the past few days.  Denies nausea, vomiting, diarrhea.  Patient is a resident at [facility].  Patient seen in the ED in 11/2 for thrombocytopenia which appears to be chronic in nature.  Per previous provider note, patient has had numerous recent falls.  Patient denies any recent falls over the past few days.  He is not currently on blood thinners.  Denies changes to vision, changes to speech, and unilateral weakness.  Review of Systems  Positive: Dizziness, weakness Negative: Speech changes  Physical Exam  BP (!) 156/98   Pulse 62   Temp (!) 97.5 F (36.4 C) (Oral)   Resp 16   SpO2 99%  Gen:   Awake, no distress   Resp:  Normal effort  MSK:   Moves extremities without difficulty  Other:  Normal speech, no facial droop, no pronator drift  Medical Decision Making  Medically screening exam initiated at 3:22 PM.  Appropriate orders placed.  Daryan Buell was informed that the remainder of the evaluation will be completed by another provider, this initial triage assessment does not replace that evaluation, and the importance of remaining in the ED until their evaluation is complete.  Dizziness which could be related to decreased po intake; however given previous provider's note about recent falls, CT head ordered. Labs to rule out electrolyte abnormalities.    Mannie Stabile, PA-C 12/05/21 1524    Terald Sleeper, MD 12/05/21 [phone]

## 2021-12-05 NOTE — ED Notes (Signed)
Patient is resting comfortably. 

## 2021-12-05 NOTE — H&P (Signed)
History and Physical   Johnny Dunlap UVO:[id number] DOB: 02/07/1950 DOA: 12/05/2021  PCP: [facility] [place]   Patient coming from: [facility]  Chief Complaint: Weakness, dizziness  HPI: Johnny Dunlap is a 71 y.o. male with medical history significant of cirrhosis, alcohol use, GI bleed, anemia, thrombocytopenia, COPD, generalized weakness presenting with ongoing progressive weakness and dizziness.  Patient states that he has had several weeks of progressive weakness and dizziness feels lightheaded at times and is difficult for him to walk.  Does have history of cirrhosis with history of hepatic encephalopathy.  Was seen in the ED a month ago with similar symptoms with ammonia elevated at that time sent back to facility. He states he has been feeling "shaky" as well. Denies current alcohol use. P.o. intake decreased recently.    Denies fevers, chills, chest pain, shortness of breath, abdominal pain, constipation, diarrhea, nausea, vomiting.   ED Course: Vital signs in the ED significant for blood pressure in the 742V to 956L systolic, heart rate in the 50s to 60s.  Lab work-up showed CMP with bicarb 21, glucose 56, albumin 2.9, alk phos stable at 148, T bili stable at 3.4.  CBC showed hemoglobin stable at 10.8 and platelets stable at 54.  Ammonia level elevated to 113 urinalysis pending.  CT head showed no acute normality.  Patient received dose of lactulose in the ED.  Review of Systems: As per HPI otherwise all other systems reviewed and are negative.  Past Medical History:  Diagnosis Date   Cirrhosis ([place])    COPD (chronic obstructive pulmonary disease) ([place])     Past Surgical History:  Procedure Laterality Date   MOUTH SURGERY     NO PAST SURGERIES      Social History  reports that he has been smoking cigarettes. He has never used smokeless tobacco. He reports that he does not currently use alcohol. He reports that he does not use drugs.  No Known Allergies  Family  History  Problem Relation Age of Onset   CAD Father    CAD Sister   Reviewed on admission  Prior to Admission medications   Medication Sig Start Date End Date Taking? Authorizing Provider  diclofenac Sodium (VOLTAREN) 1 % GEL Apply 2 g topically 3 (three) times daily. Apply to bilateral feet 09/21/21   Rai, Ripudeep K, MD  feeding supplement (ENSURE ENLIVE / ENSURE PLUS) LIQD Take 237 mLs by mouth 2 (two) times daily between meals. 08/11/21   Ghimire, Henreitta Leber, MD  folic acid (FOLVITE) 1 MG tablet Take 1 tablet (1 mg total) by mouth daily. 09/22/21   Rai, Vernelle Emerald, MD  furosemide (LASIX) 40 MG tablet Take 1 tablet (40 mg total) by mouth daily as needed for fluid or edema. 08/11/21   Ghimire, Henreitta Leber, MD  gabapentin (NEURONTIN) 400 MG capsule Take 400 mg by mouth 3 (three) times daily.    [provider]  lactulose (CHRONULAC) 10 GM/15ML solution Take 45 mLs (30 g total) by mouth 3 (three) times daily. 09/21/21   Rai, Vernelle Emerald, MD  midodrine (PROAMATINE) 10 MG tablet Take 0.5 tablets (5 mg total) by mouth 3 (three) times daily with meals. Hold if SBP is more than 130 08/11/21   Ghimire, Henreitta Leber, MD  Multiple Vitamin (MULTIVITAMIN WITH MINERALS) TABS tablet Take 1 tablet by mouth daily. 08/09/21   Ghimire, Henreitta Leber, MD  tamsulosin (FLOMAX) 0.4 MG CAPS capsule Take 1 capsule (0.4 mg total) by mouth daily. 09/21/21  Rai, Vernelle Emerald, MD  thiamine 100 MG tablet Take 1 tablet (100 mg total) by mouth daily. 09/22/21   Rai, Vernelle Emerald, MD  Vitamin D, Ergocalciferol, (DRISDOL) 1.25 MG (50000 UNIT) CAPS capsule Take 50,000 Units by mouth every 7 (seven) days. Mondays    [provider]    Physical Exam: Vitals:   12/05/21 1745 12/05/21 1800 12/05/21 1830 12/05/21 2010  BP: (!) 110/92 (!) 125/98 104/62 101/65  Pulse: (!) 56 (!) 56 60 66  Resp: _0 Temp:      TempSrc:      SpO2: 100% 100% 100% 96%  Weight:      Height:       Physical Exam Constitutional:       General: He is not in acute distress.    Comments: Thin appearing elderly male  HENT:     Head: Normocephalic and atraumatic.     Mouth/Throat:     Mouth: Mucous membranes are moist.     Pharynx: Oropharynx is clear.  Eyes:     Extraocular Movements: Extraocular movements intact.     Pupils: Pupils are equal, round, and reactive to light.  Cardiovascular:     Rate and Rhythm: Normal rate and regular rhythm.     Pulses: Normal pulses.     Heart sounds: Normal heart sounds.  Pulmonary:     Effort: Pulmonary effort is normal. No respiratory distress.     Breath sounds: Normal breath sounds.  Abdominal:     General: Bowel sounds are normal. There is no distension.     Palpations: Abdomen is soft.     Tenderness: There is no abdominal tenderness.  Musculoskeletal:        General: No swelling or deformity.  Skin:    General: Skin is warm and dry.  Neurological:     General: No focal deficit present.     Mental Status: Mental status is at baseline.   Labs on Admission: I have personally reviewed following labs and imaging studies  CBC: Recent Labs  Lab 12/05/21 1524  WBC 4.5  NEUTROABS 2.7  HGB 10.8*  HCT 33.3*  MCV 101.2*  PLT 54*    Basic Metabolic Panel: Recent Labs  Lab 12/05/21 1524  NA 138  K 4.1  CL 109  CO2 21*  GLUCOSE 56*  BUN 20  CREATININE 1.11  CALCIUM 9.3    GFR: Estimated Creatinine Clearance: 60.4 mL/min (by C-G formula based on SCr of 1.11 mg/dL).  Liver Function Tests: Recent Labs  Lab 12/05/21 1524  AST 37  ALT 23  ALKPHOS 148*  BILITOT 3.4*  PROT 6.8  ALBUMIN 2.9*    Urine analysis:    Component Value Date/Time   COLORURINE YELLOW 09/19/2021 1919   APPEARANCEUR CLEAR 09/19/2021 1919   LABSPEC 1.015 09/19/2021 1919   PHURINE 7.5 09/19/2021 1919   GLUCOSEU NEGATIVE 09/19/2021 1919   HGBUR LARGE (A) 09/19/2021 1919   BILIRUBINUR NEGATIVE 09/19/2021 1919   KETONESUR NEGATIVE 09/19/2021 1919   PROTEINUR NEGATIVE 09/19/2021  1919   NITRITE POSITIVE (A) 09/19/2021 1919   LEUKOCYTESUR SMALL (A) 09/19/2021 1919    Radiological Exams on Admission: CT Head Wo Contrast  Result Date: 12/05/2021 CLINICAL DATA:  Dizziness EXAM: CT HEAD WITHOUT CONTRAST TECHNIQUE: Contiguous axial images were obtained from the base of the skull through the vertex without intravenous contrast. COMPARISON:  11/01/2021 FINDINGS: Brain: No evidence of acute infarction, hemorrhage, hydrocephalus, extra-axial collection or mass lesion/mass effect. Extensive low-density  changes within the periventricular and subcortical white matter compatible with chronic microvascular ischemic change. Mild diffuse cerebral volume loss. Vascular: Atherosclerotic calcifications involving the large vessels of the skull base. No unexpected hyperdense vessel. Skull: Normal. Negative for fracture or focal lesion. Sinuses/Orbits: Similar mucosal thickening within the bilateral ethmoid air cells. No acute findings. Other: None. IMPRESSION: 1. No acute intracranial findings. 2. Stable advanced chronic microvascular ischemic change. Electronically Signed   By: Davina Poke D.O.   On: 12/05/2021 16:12    EKG: Independently reviewed.  Sinus rhythm at 63 bpm.  Assessment/Plan Principal Problem:   Weakness Active Problems:   Other cirrhosis of liver (HCC)   Thrombocytopenia (HCC)   Iron deficiency anemia   Protein-calorie malnutrition, severe   COPD (chronic obstructive pulmonary disease) (HCC)   Generalized weakness  Weakness Dizziness Rule out hepatic encephalopathy Hypoglycemic episode > Patient presenting with progressive weakness and dizziness for the past several weeks.  Found to have elevated ammonia and considering his history of cirrhosis and possible subtle presentations with hepatic encephalopathy has been started on lactulose to rule this out.  Concerns for if his hypoglycemia experienced in the ED is recurrent. > Possible asterixis on exam (increased  tremor but not classic) > No further explanation for weakness or dizziness found thus far no evidence of infection, could be mildly dehydrated, he reports some decreased p.o. intake.  Has history of malnutrition as well..  Electrolytes stable. > Noted to have hypoglycemia and 67 on POC CBG.  Improved to 110 after receiving nutrition. - Monitor on telemetry - Continue lactulose for bowel movement optimization of 2-3 stools a day - Check magnesium - We will give IV fluids overnight - Follow-up urinalysis obtained in the ED - PT and OT eval and treat, will also get SLP eval given he has had history of swallowing recently and he was noted to cough while drinking liquid in the ED. - We will do CBG checks overnight and tomorrow.  Cirrhosis Thrombocytopenia > Known history of cirrhosis and alcohol use.  Stable thrombocytopenia at 56 secondary to cirrhosis. > Labs stable with normal ALT and AST.  Stable alk phos of 148.  Stable T bili at 3.4.  Albumin 2.9. - Trend LFTs - Ruling out hepatic encephalopathy/treating for hepatic encephalopathy as above with lactulose - Continue thiamine and folate  - Continue midodrine - Gentle IV fluids as above - Resume PRN Lasix tomorrow   Anemia > Hemoglobin stable at 10.8 - Trend CBC  DVT prophylaxis: SCDs, thrombocytopenia.  Code Status:    Full Family Communication:  None on admission  Disposition Plan:   Patient is from:  [place] to:  Same as above  Anticipated DC date:  1 to 3 days  Anticipated DC barriers: None  Consults called:  None  Admission status:  Observation, telemetry   Severity of Illness: The appropriate patient status for this patient is OBSERVATION. Observation status is judged to be reasonable and necessary in order to provide the required intensity of service to ensure the patient's safety. The patient's presenting symptoms, physical exam findings, and initial radiographic and laboratory data in the context of  their medical condition is felt to place them at decreased risk for further clinical deterioration. Furthermore, it is anticipated that the patient will be medically stable for discharge from the hospital within 2 midnights of admission.    Marcelyn Bruins MD [facility]  How to contact the [facility] Attending or Consulting provider [place]  or covering provider during after hours [place], for this patient?   Check the care team in [facility] and look for a) attending/consulting [facility] provider listed and b) the [facility] team listed Log into [email or web address] and use [place] universal password to access. If you do not have the password, please contact the hospital operator. Locate the [facility] provider you are looking for under [facility] and page to a number that you can be directly reached. If you still have difficulty reaching the provider, please page the [facility] (Director on Call) for the Hospitalists listed on amion for assistance.  12/05/2021, 8:29 PM

## 2021-12-05 NOTE — ED Notes (Signed)
Patient is resting comfortably. IVF bolus still infusing.  Thiamine has not beenb verified by pharmacy yet. Pt in NAD and denies pain

## 2021-12-05 NOTE — ED Triage Notes (Signed)
Patient arrived by [facility] from [facility]. Patient walked to store and called ems for general weakness and 2 days of dizziness. Alert and oriented, denies pain

## 2021-12-06 DIAGNOSIS — D696 Thrombocytopenia, unspecified: Secondary | ICD-10-CM | POA: Diagnosis not present

## 2021-12-06 DIAGNOSIS — K7682 Hepatic encephalopathy: Secondary | ICD-10-CM | POA: Diagnosis not present

## 2021-12-06 DIAGNOSIS — R531 Weakness: Secondary | ICD-10-CM | POA: Diagnosis not present

## 2021-12-06 LAB — CBC
HCT: 28.8 % — ABNORMAL LOW (ref 39.0–52.0)
Hemoglobin: 9.5 g/dL — ABNORMAL LOW (ref 13.0–17.0)
MCH: 33.3 pg (ref 26.0–34.0)
MCHC: 33 g/dL (ref 30.0–36.0)
MCV: 101.1 fL — ABNORMAL HIGH (ref 80.0–100.0)
Platelets: 43 10*3/uL — ABNORMAL LOW (ref 150–400)
RBC: 2.85 MIL/uL — ABNORMAL LOW (ref 4.22–5.81)
RDW: 15.9 % — ABNORMAL HIGH (ref 11.5–15.5)
WBC: 3.2 10*3/uL — ABNORMAL LOW (ref 4.0–10.5)
nRBC: 0 % (ref 0.0–0.2)

## 2021-12-06 LAB — GLUCOSE, CAPILLARY
Glucose-Capillary: 102 mg/dL — ABNORMAL HIGH (ref 70–99)
Glucose-Capillary: 118 mg/dL — ABNORMAL HIGH (ref 70–99)

## 2021-12-06 LAB — PROTIME-INR
INR: 1.9 — ABNORMAL HIGH (ref 0.8–1.2)
Prothrombin Time: 21.5 seconds — ABNORMAL HIGH (ref 11.4–15.2)

## 2021-12-06 LAB — COMPREHENSIVE METABOLIC PANEL
ALT: 17 U/L (ref 0–44)
AST: 32 U/L (ref 15–41)
Albumin: 2.5 g/dL — ABNORMAL LOW (ref 3.5–5.0)
Alkaline Phosphatase: 122 U/L (ref 38–126)
Anion gap: 6 (ref 5–15)
BUN: 20 mg/dL (ref 8–23)
CO2: 23 mmol/L (ref 22–32)
Calcium: 8.6 mg/dL — ABNORMAL LOW (ref 8.9–10.3)
Chloride: 108 mmol/L (ref 98–111)
Creatinine, Ser: 1.17 mg/dL (ref 0.61–1.24)
GFR, Estimated: >60 mL/min (ref >60)
Glucose, Bld: 110 mg/dL — ABNORMAL HIGH (ref 70–99)
Potassium: 3.6 mmol/L (ref 3.5–5.1)
Sodium: 137 mmol/L (ref 135–145)
Total Bilirubin: 3.2 mg/dL — ABNORMAL HIGH (ref 0.3–1.2)
Total Protein: 5.9 g/dL — ABNORMAL LOW (ref 6.5–8.1)

## 2021-12-06 LAB — CBG MONITORING, ED
Glucose-Capillary: 76 mg/dL (ref 70–99)
Glucose-Capillary: 87 mg/dL (ref 70–99)

## 2021-12-06 LAB — URINALYSIS, ROUTINE W REFLEX MICROSCOPIC
Bilirubin Urine: NEGATIVE
Glucose, UA: NEGATIVE mg/dL
Ketones, ur: NEGATIVE mg/dL
Nitrite: POSITIVE — AB
Protein, ur: NEGATIVE mg/dL
Specific Gravity, Urine: 1.01 (ref 1.005–1.030)
pH: 7 (ref 5.0–8.0)

## 2021-12-06 LAB — URINALYSIS, MICROSCOPIC (REFLEX)
RBC / HPF: >50 RBC/hpf (ref 0–5)
WBC, UA: >50 WBC/hpf (ref 0–5)

## 2021-12-06 MED ORDER — GABAPENTIN 400 MG PO CAPS
400.0000 mg | ORAL_CAPSULE | Freq: Once | ORAL | Status: AC
  Administered 2021-12-06: 400 mg via ORAL
  Filled 2021-12-06: qty 1

## 2021-12-06 MED ORDER — CEFTRIAXONE SODIUM 1 G IJ SOLR
1.0000 g | INTRAMUSCULAR | Status: DC
  Administered 2021-12-06 – 2021-12-08 (×3): 1 g via INTRAVENOUS
  Filled 2021-12-06 (×4): qty 10

## 2021-12-06 NOTE — ED Notes (Signed)
Breakfast orders placed 

## 2021-12-06 NOTE — Progress Notes (Signed)
Occupational Therapy Evaluation  PTA pt living at SNF. Apparently before that time, per chart review, pt homeless. Pt requires +2 mod A for limited mobility and Mod A for ADL assistance. Pt will benefit from rehab at SNF to maximize functional level of independence. Pt's goal is to live in an ALF. Will follow acutely.    12/06/21 1500  OT Visit Information  Last OT Received On 12/06/21  Assistance Needed +2 (mobility progression)  PT/OT/SLP Co-Evaluation/Treatment Yes  Reason for Co-Treatment For patient/therapist safety;To address functional/ADL transfers  OT goals addressed during session ADL's and self-care  History of Present Illness Pt is a 71 y/o male admitted 12/6 secondary to dizziness and weakness. PMH includes cirrhosis, alcohol use, GI bleed, anemia, thrombocytopenia, COPD.  Precautions  Precautions Fall  Home Living  Family/patient expects to be discharged to: Skilled nursing facility  Additional Comments From [facility]  Prior Function  Prior Level of Function  Patient poor historian/Family not available  Mobility Comments Per notes, uses RW vs WC for mobility tasks. States he wheeled himself to the "store" beside his facility to call EMS  ADLs Comments Reports he was completing his ADL tasks  Communication  Communication No difficulties  Pain Assessment  Pain Assessment No/denies pain  Cognition  Arousal/Alertness Awake/alert  Behavior During Therapy Impulsive (nursing states he gets upset easily; had pulled off his telewires)  Overall Cognitive Status No family/caregiver present to determine baseline cognitive functioning  General Comments Decreased safety awareness and confusion  Upper Extremity Assessment  Upper Extremity Assessment Generalized weakness  Lower Extremity Assessment  Lower Extremity Assessment Defer to PT evaluation  Cervical / Trunk Assessment  Cervical / Trunk Assessment Kyphotic  ADL  Overall ADL's  Needs assistance/impaired  Grooming  Set up  Upper Body Bathing Set up  Lower Body Bathing Moderate assistance;Sit to/from stand  Upper Body Dressing  Minimal assistance  Lower Body Dressing Moderate assistance  Toilet Transfer Moderate assistance;+2 for physical assistance  Functional mobility during ADLs Moderate assistance;Rolling walker (2 wheels);+2 for safety/equipment  Bed Mobility  Overal bed mobility Needs Assistance  Bed Mobility Supine to Sit;Sit to Supine  Supine to sit Min assist  Sit to supine Min assist  General bed mobility comments Assist for trunk and LE  Transfers  Overall transfer level Needs assistance  Equipment used Rolling walker (2 wheels)  Transfers Sit to/from Stand  Sit to Stand Mod assist;+2 physical assistance;+2 safety/equipment  General transfer comment Mod A  Balance  Overall balance assessment Needs assistance  Sitting-balance support No upper extremity supported;Feet supported  Sitting balance-Leahy Scale Fair  Standing balance support Bilateral upper extremity supported  Standing balance-Leahy Scale Poor  Standing balance comment Reliant on UE and external support  General Comments  General comments (skin integrity, edema, etc.) Wants to stay at an ALF not a SNF  OT - End of Session  Activity Tolerance Patient tolerated treatment well  Patient left in bed;with call bell/phone within reach  Nurse Communication Mobility status  OT Assessment  OT Recommendation/Assessment Patient needs continued OT Services  OT Visit Diagnosis Unsteadiness on feet (R26.81);Other abnormalities of gait and mobility (R26.89);Muscle weakness (generalized) (M62.81);History of falling (Z91.81);Other symptoms and signs involving cognitive function  OT Problem List Decreased strength;Decreased activity tolerance;Impaired balance (sitting and/or standing);Decreased cognition;Decreased safety awareness;Decreased knowledge of use of DME or AE;Impaired UE functional use  OT Plan  OT Frequency (ACUTE ONLY) Min  2X/week  OT Treatment/Interventions (ACUTE ONLY) Self-care/ADL training;Therapeutic exercise;Energy conservation;DME and/or AE instruction;Therapeutic activities;Cognitive remediation/compensation;Visual/perceptual  remediation/compensation;Patient/family education;Balance training  AM-PAC OT "6 Clicks" Daily Activity Outcome Measure (Version 2)  Help from another person eating meals? 4  Help from another person taking care of personal grooming? 3  Help from another person toileting, which includes using toliet, bedpan, or urinal? 2  Help from another person bathing (including washing, rinsing, drying)? 2  Help from another person to put on and taking off regular upper body clothing? 3  Help from another person to put on and taking off regular lower body clothing? 2  6 Click Score 16  Progressive Mobility  What is the highest level of mobility based on the progressive mobility assessment? Level 3 (Stands with assist) - Balance while standing  and cannot march in place  Mobility Out of bed for toileting;Out of bed to chair with meals  OT Recommendation  Follow Up Recommendations Skilled nursing-short term rehab (<3 hours/day)  Assistance recommended at discharge Frequent or constant Supervision/Assistance  Functional Status Assessent Patient has had a recent decline in their functional status and/or demonstrates limited ability to make significant improvements in function in a reasonable and predictable amount of time  Individuals Consulted  Consulted and Agree with Results and Recommendations Patient unable/family or caregiver not available  Acute Rehab OT Goals  Patient Stated Goal to go to an ALF  OT Goal Formulation Patient unable to participate in goal setting  Time For Goal Achievement 12/20/21  Potential to Achieve Goals Good  OT Time Calculation  OT Start Time (ACUTE ONLY) 1113  OT Stop Time (ACUTE ONLY) 1128  OT Time Calculation (min) 15 min  OT General Charges  $OT Visit 1 Visit   OT Evaluation  $OT Eval Moderate Complexity 1 Mod  Written Expression  Dominant Hand Right  Luisa Dago, OT/L   Acute OT Clinical Specialist Acute Rehabilitation Services Pager [phone] Office [phone]

## 2021-12-06 NOTE — Evaluation (Signed)
Physical Therapy Evaluation Patient Details Name: Johnny Dunlap MRN: [id number] DOB: 11-10-1950 Today's Date: 12/06/2021  History of Present Illness  Pt is a 71 y/o male admitted 12/6 secondary to dizziness and weakness. PMH includes cirrhosis, alcohol use, GI bleed, anemia, thrombocytopenia, COPD.  Clinical Impression  Pt admitted secondary to problem above with deficits below. Pt requiring min to mod A +2 for mobility tasks. Very unsteady throughout. Also presenting with cognitive deficits, but unsure of baseline. Pt currently at [facility], but was using RW vs WC depending on the day. Recommending PT follow up at SNF to increase independence and safety. Will continue to follow acutely.        Recommendations for follow up therapy are one component of a multi-disciplinary discharge planning process, led by the attending physician.  Recommendations may be updated based on patient status, additional functional criteria and insurance authorization.  Follow Up Recommendations Skilled nursing-short term rehab (<3 hours/day)    Assistance Recommended at Discharge Frequent or constant Supervision/Assistance  Functional Status Assessment Patient has had a recent decline in their functional status and demonstrates the ability to make significant improvements in function in a reasonable and predictable amount of time.  Equipment Recommendations  None recommended by PT    Recommendations for Other Services       Precautions / Restrictions Precautions Precautions: Fall Restrictions Weight Bearing Restrictions: No      Mobility  Bed Mobility Overal bed mobility: Needs Assistance Bed Mobility: Supine to Sit;Sit to Supine     Supine to sit: Min assist Sit to supine: Min assist   General bed mobility comments: Assist for trunk and LE    Transfers Overall transfer level: Needs assistance Equipment used: Rolling walker (2 wheels) Transfers: Sit to/from Stand Sit to Stand: Mod  assist;+2 physical assistance;+2 safety/equipment           General transfer comment: Mod A for lift assist and steadying, especially from lower surface    Ambulation/Gait Ambulation/Gait assistance: Mod assist;+2 physical assistance;Min assist Gait Distance (Feet): 5 Feet Assistive device: Rolling walker (2 wheels) Gait Pattern/deviations: Step-through pattern Gait velocity: Decreased     General Gait Details: Very unsteady. Up to mod A for steadying assist to ambulate to chair and then back to bed in ED room.  Stairs            Wheelchair Mobility    Modified Rankin (Stroke Patients Only)       Balance Overall balance assessment: Needs assistance Sitting-balance support: No upper extremity supported;Feet supported Sitting balance-Leahy Scale: Fair     Standing balance support: Bilateral upper extremity supported Standing balance-Leahy Scale: Poor Standing balance comment: Reliant on UE and external support                             Pertinent Vitals/Pain Pain Assessment: No/denies pain    Home Living Family/patient expects to be discharged to:: Skilled nursing facility                   Additional Comments: From [facility]    Prior Function Prior Level of Function : Patient poor historian/Family not available             Mobility Comments: Per notes, uses RW vs WC for mobility tasks       Hand Dominance        Extremity/Trunk Assessment   Upper Extremity Assessment Upper Extremity Assessment: Defer to OT evaluation  Lower Extremity Assessment Lower Extremity Assessment: Generalized weakness    Cervical / Trunk Assessment Cervical / Trunk Assessment: Kyphotic  Communication   Communication: No difficulties  Cognition Arousal/Alertness: Awake/alert Behavior During Therapy: Impulsive Overall Cognitive Status: No family/caregiver present to determine baseline cognitive functioning                                  General Comments: Decreased safety awareness and confusion        General Comments      Exercises     Assessment/Plan    PT Assessment Patient needs continued PT services  PT Problem List Decreased strength;Decreased activity tolerance;Decreased balance;Decreased mobility;Decreased cognition;Decreased knowledge of use of DME;Decreased safety awareness;Decreased knowledge of precautions       PT Treatment Interventions DME instruction;Gait training;Therapeutic activities;Functional mobility training;Balance training;Therapeutic exercise;Cognitive remediation;Patient/family education    PT Goals (Current goals can be found in the Care Plan section)  Acute Rehab PT Goals Patient Stated Goal: to sleep PT Goal Formulation: With patient Time For Goal Achievement: 12/20/21 Potential to Achieve Goals: Good    Frequency Min 2X/week   Barriers to discharge        Co-evaluation PT/OT/SLP Co-Evaluation/Treatment: Yes Reason for Co-Treatment: For patient/therapist safety;To address functional/ADL transfers PT goals addressed during session: Mobility/safety with mobility;Balance         AM-PAC PT "6 Clicks" Mobility  Outcome Measure Help needed turning from your back to your side while in a flat bed without using bedrails?: A Little Help needed moving from lying on your back to sitting on the side of a flat bed without using bedrails?: A Little Help needed moving to and from a bed to a chair (including a wheelchair)?: A Lot Help needed standing up from a chair using your arms (e.g., wheelchair or bedside chair)?: Total Help needed to walk in hospital room?: A Lot Help needed climbing 3-5 steps with a railing? : Total 6 Click Score: 12    End of Session Equipment Utilized During Treatment: Gait belt Activity Tolerance: Patient tolerated treatment well Patient left: in bed;with call bell/phone within reach (on stretcher in ED) Nurse Communication: Mobility  status PT Visit Diagnosis: Unsteadiness on feet (R26.81);Muscle weakness (generalized) (M62.81);Difficulty in walking, not elsewhere classified (R26.2)    Time: 4540-9811 PT Time Calculation (min) (ACUTE ONLY): 16 min   Charges:   PT Evaluation $PT Eval Moderate Complexity: 1 Mod          Farley Ly, PT, DPT  Acute Rehabilitation Services  Pager: [phone] Office: [phone]   [facility] Prom 12/06/2021, 2:56 PM

## 2021-12-06 NOTE — Progress Notes (Signed)
[facility]  PROGRESS NOTE  Johnny Dunlap [id number] DOB: [birth year] DOA: 12/05/2021 PCP: [facility]   Brief HPI:   71 year old male with medical history of cirrhosis, alcohol use, GI bleed, anemia, thrombocytopenia, COPD, generalized weakness presented with ongoing progressive weakness and dizziness. Patient said that he had have several weeks of progressive weakness and dizziness.  Feels lightheaded at times.  Also having difficulty with walking.  CT head showed no acute abnormality.    Subjective   Patient seen and examined, ammonia was elevated to 113 yesterday.  Started on lactulose with large bowel movement yesterday.  Feels that dizziness has improved.   Assessment/Plan:     Weakness/dizziness -Unclear etiology, patient found to have elevated ammonia level; started on lactulose 30 g 3 times daily -Also having episodes of hypoglycemia; improved with nutrition -UA in the ED was abnormal, will obtain urine culture -Start empiric ceftriaxone 1 g IV every 24 hours -PT/OT consulted  Liver cirrhosis -Patient has known history of liver cirrhosis due to alcohol abuse -Also has number cytopenia at 54,000 due to liver cirrhosis -Labs are stable with normal ALT and AST -Continue thiamine and folate -Continue midodrine for hypotension -Lasix currently on hold  Anemia of chronic disease -Hemoglobin dropped to 9.5 today likely from dilution from IV fluids -Follow CBC in a.m.     Medications     feeding supplement  237 mL Oral BID BM   folic acid  1 mg Oral Daily   lactulose  30 g Oral TID   midodrine  5 mg Oral TID WC   multivitamin with minerals  1 tablet Oral Daily   sodium chloride flush  3 mL Intravenous Q12H   tamsulosin  0.4 mg Oral Daily   thiamine  100 mg Oral Daily     Data Reviewed:   CBG:  Recent Labs  Lab 12/05/21 1729 12/05/21 1814 12/06/21 0720 12/06/21 1134  GLUCAP 67* 110* 76 87    SpO2: 97 %    Vitals:   12/06/21  0930 12/06/21 1000 12/06/21 1203 12/06/21 1300  BP: 106/65 102/62 (!) 87/45 (!) 98/56  Pulse: 71 70 73 65  Resp: 15 15 16 16   Temp:    98 F (36.7 C)  TempSrc:      SpO2: 99% 98% 100% 97%  Weight:      Height:         Intake/Output Summary (Last 24 hours) at 12/06/2021 1406 Last data filed at 12/06/2021 0827 Gross per 24 hour  Intake 1789.1 ml  Output 601 ml  Net 1188.1 ml    12/05 1901 - 12/07 0700 In: 999  Out: [place] Weights   12/05/21 1741  Weight: 68 kg    Data Reviewed: Basic Metabolic Panel: Recent Labs  Lab 12/05/21 1524 12/06/21 0348  NA 138 137  K 4.1 3.6  CL 109 108  CO2 21* 23  GLUCOSE 56* 110*  BUN 20 20  CREATININE 1.11 1.17  CALCIUM 9.3 8.6*   Liver Function Tests: Recent Labs  Lab 12/05/21 1524 12/06/21 0348  AST 37 32  ALT 23 17  ALKPHOS 148* 122  BILITOT 3.4* 3.2*  PROT 6.8 5.9*  ALBUMIN 2.9* 2.5*   No results for input(s): LIPASE, AMYLASE in the last 168 hours. Recent Labs  Lab 12/05/21 1524  AMMONIA 113*   CBC: Recent Labs  Lab 12/05/21 1524 12/06/21 0348  WBC 4.5 3.2*  NEUTROABS 2.7  --   HGB 10.8* 9.5*  HCT 33.3* 28.8*  MCV 101.2* 101.1*  PLT 54* 43*   Cardiac Enzymes: No results for input(s): CKTOTAL, CKMB, CKMBINDEX, TROPONINI in the last 168 hours. BNP (last 3 results) Recent Labs    07/23/21 1328  BNP 278.9*    ProBNP (last 3 results) No results for input(s): PROBNP in the last 8760 hours.  CBG: Recent Labs  Lab 12/05/21 1729 12/05/21 1814 12/06/21 0720 12/06/21 1134  GLUCAP 67* 110* 76 87       Radiology Reports  CT Head Wo Contrast  Result Date: 12/05/2021 CLINICAL DATA:  Dizziness EXAM: CT HEAD WITHOUT CONTRAST TECHNIQUE: Contiguous axial images were obtained from the base of the skull through the vertex without intravenous contrast. COMPARISON:  11/01/2021 FINDINGS: Brain: No evidence of acute infarction, hemorrhage, hydrocephalus, extra-axial collection or mass  lesion/mass effect. Extensive low-density changes within the periventricular and subcortical white matter compatible with chronic microvascular ischemic change. Mild diffuse cerebral volume loss. Vascular: Atherosclerotic calcifications involving the large vessels of the skull base. No unexpected hyperdense vessel. Skull: Normal. Negative for fracture or focal lesion. Sinuses/Orbits: Similar mucosal thickening within the bilateral ethmoid air cells. No acute findings. Other: None. IMPRESSION: 1. No acute intracranial findings. 2. Stable advanced chronic microvascular ischemic change. Electronically Signed   By: Duanne Guess D.O.   On: 12/05/2021 16:12       Antibiotics: Anti-infectives (From admission, onward)    None         DVT prophylaxis: SCDs  Code Status: Full code  Family Communication: No family at bedside   Consultants:   Procedures:     Objective    Physical Examination:   General-appears in no acute distress Heart-S1-S2, regular, no murmur auscultated Lungs-clear to auscultation bilaterally, no wheezing or crackles auscultated Abdomen-soft, nontender, no organomegaly Extremities-no edema in the lower extremities Neuro-alert, oriented x3, no focal deficit noted  Status is: Inpatient  Dispo: The patient is from: Home              Anticipated d/c is to: Home              Anticipated d/c date is: 12/10/2019              Patient currently not stable for discharge  Barrier to discharge-ongoing evaluation for dizziness and altered mental status  COVID-19 Labs  No results for input(s): DDIMER, FERRITIN, LDH, CRP in the last 72 hours.  Lab Results  Component Value Date   SARSCOV2NAA POSITIVE (A) 09/21/2021   SARSCOV2NAA POSITIVE (A) 08/01/2021   SARSCOV2NAA POSITIVE (A) 07/23/2021            No results found for this or any previous visit (from the past 240 hour(s)).  Meredeth Ide   [facility] If 7PM-7AM, please contact  night-coverage at [email or web address], Office  [phone]   12/06/2021, 2:06 PM  LOS: 0 days

## 2021-12-06 NOTE — Progress Notes (Signed)
Pt continues to decline the Tele Monitor.  Pt educated on the importance of monitoring.

## 2021-12-06 NOTE — ED Notes (Signed)
Pt agreeable to place BP cuff and pulse ox back in place but refuses cardiac leads. Noted hypotension, MD aware, pt says "come back later" with midodrine. No symptoms noted and free of distress, will monitor closely. Call light in reach.

## 2021-12-06 NOTE — ED Notes (Signed)
Pt tore off cardiac leads, pulse ox and BP cuff, refused to drink lactulose, said "I just want to sleep!" Attempted to educate re: importance of monitoring equipment remaining in place, pt adamantly refusing. Lactulose remains on bedside table but pt refuses to drink it. RN will continue to monitor, sent secure chat to Dr Sharl Ma with this update.

## 2021-12-07 ENCOUNTER — Observation Stay (HOSPITAL_COMMUNITY): Payer: Medicare (Managed Care)

## 2021-12-07 DIAGNOSIS — R531 Weakness: Secondary | ICD-10-CM | POA: Diagnosis not present

## 2021-12-07 DIAGNOSIS — D696 Thrombocytopenia, unspecified: Secondary | ICD-10-CM | POA: Diagnosis not present

## 2021-12-07 LAB — URINE CULTURE

## 2021-12-07 LAB — CBC
HCT: 26.1 % — ABNORMAL LOW (ref 39.0–52.0)
Hemoglobin: 8.8 g/dL — ABNORMAL LOW (ref 13.0–17.0)
MCH: 33.3 pg (ref 26.0–34.0)
MCHC: 33.7 g/dL (ref 30.0–36.0)
MCV: 98.9 fL (ref 80.0–100.0)
Platelets: 38 10*3/uL — ABNORMAL LOW (ref 150–400)
RBC: 2.64 MIL/uL — ABNORMAL LOW (ref 4.22–5.81)
RDW: 15.7 % — ABNORMAL HIGH (ref 11.5–15.5)
WBC: 3 10*3/uL — ABNORMAL LOW (ref 4.0–10.5)
nRBC: 0 % (ref 0.0–0.2)

## 2021-12-07 LAB — COMPREHENSIVE METABOLIC PANEL
ALT: 18 U/L (ref 0–44)
AST: 31 U/L (ref 15–41)
Albumin: 2.3 g/dL — ABNORMAL LOW (ref 3.5–5.0)
Alkaline Phosphatase: 114 U/L (ref 38–126)
Anion gap: 4 — ABNORMAL LOW (ref 5–15)
BUN: 19 mg/dL (ref 8–23)
CO2: 21 mmol/L — ABNORMAL LOW (ref 22–32)
Calcium: 8.4 mg/dL — ABNORMAL LOW (ref 8.9–10.3)
Chloride: 110 mmol/L (ref 98–111)
Creatinine, Ser: 1.02 mg/dL (ref 0.61–1.24)
GFR, Estimated: >60 mL/min (ref >60)
Glucose, Bld: 76 mg/dL (ref 70–99)
Potassium: 3.8 mmol/L (ref 3.5–5.1)
Sodium: 135 mmol/L (ref 135–145)
Total Bilirubin: 2.6 mg/dL — ABNORMAL HIGH (ref 0.3–1.2)
Total Protein: 5.4 g/dL — ABNORMAL LOW (ref 6.5–8.1)

## 2021-12-07 LAB — GLUCOSE, CAPILLARY: Glucose-Capillary: 95 mg/dL (ref 70–99)

## 2021-12-07 MED ORDER — GABAPENTIN 400 MG PO CAPS
400.0000 mg | ORAL_CAPSULE | Freq: Three times a day (TID) | ORAL | Status: DC
  Administered 2021-12-07 – 2021-12-14 (×22): 400 mg via ORAL
  Filled 2021-12-07 (×23): qty 1

## 2021-12-07 MED ORDER — MIDODRINE HCL 5 MG PO TABS
5.0000 mg | ORAL_TABLET | Freq: Once | ORAL | Status: AC
  Administered 2021-12-07: 5 mg via ORAL
  Filled 2021-12-07: qty 1

## 2021-12-07 NOTE — Evaluation (Signed)
Clinical/Bedside Swallow Evaluation Patient Details  Name: Johnny Dunlap MRN: [id number] Date of Birth: [birth year]  Today's Date: 12/07/2021 Time: SLP Start Time (ACUTE ONLY): 0857 SLP Stop Time (ACUTE ONLY): 0917 SLP Time Calculation (min) (ACUTE ONLY): 20 min  Past Medical History:  Past Medical History:  Diagnosis Date   Cirrhosis (HCC)    COPD (chronic obstructive pulmonary disease) (HCC)    Past Surgical History:  Past Surgical History:  Procedure Laterality Date   MOUTH SURGERY     NO PAST SURGERIES     HPI:  Pt is a 71 y.o. male who presented with ongoing progressive weakness and dizziness. CT head negative. SLp consulted due to pt coughing while drinking liquid in the ED. PMH: cirrhosis, alcohol use, GI bleed, anemia, thrombocytopenia, COPD, generalized weakness, hepatic encephalopathy.    Assessment / Plan / Recommendation  Clinical Impression  Pt was seen for bedside swallow evaluation. He reported that for the past four years he has been having globus sensation with solids and liquids, and signs of aspiration with p.o. intake. Pt stated that liquids intermittently "come back up" and that he attributes this to reflux. Oral mechanism exam was [facility] and pt was edentulous. Pt identified the approximate level of the PES as the location of his having to "work to get it down". Pt tolerated purees and thin liquids without overt s/sx of aspiration, but coughing was inconsistently noted with regular texture solids. Mastication was functional despite edentulous status and oral clearance was adequate. Pt demonstrated an effortful swallow with puree solids and reported some difficulty with pharyngeal clearance. A modified barium swallow study is recommended to further assess swallow physiology. Pt's current diet will be continued until it is conducted. SLP Visit Diagnosis: Dysphagia, unspecified (R13.10)    Aspiration Risk  Mild aspiration risk    Diet Recommendation Regular;Thin liquid    Liquid Administration via: Cup;Straw Medication Administration: Whole meds with liquid Supervision: Patient able to self feed Compensations: Slow rate;Small sips/bites;Follow solids with liquid    Other  Recommendations Oral Care Recommendations: Oral care BID    Recommendations for follow up therapy are one component of a multi-disciplinary discharge planning process, led by the attending physician.  Recommendations may be updated based on patient status, additional functional criteria and insurance authorization.  Follow up Recommendations  (TBD)      Assistance Recommended at Discharge    Functional Status Assessment Patient has not had a recent decline in their functional status  Frequency and Duration min 2x/week  1 week       Prognosis Prognosis for Safe Diet Advancement: Good      Swallow Study   General Date of Onset: 12/07/17 HPI: Pt is a 71 y.o. male who presented with ongoing progressive weakness and dizziness. CT head negative. SLp consulted due to pt coughing while drinking liquid in the ED. PMH: cirrhosis, alcohol use, GI bleed, anemia, thrombocytopenia, COPD, generalized weakness, hepatic encephalopathy. Type of Study: Bedside Swallow Evaluation Previous Swallow Assessment: none Diet Prior to this Study: Regular;Thin liquids Temperature Spikes Noted: No Respiratory Status: Room air History of Recent Intubation: No Behavior/Cognition: Alert;Cooperative;Pleasant mood Oral Cavity Assessment: Within Functional Limits Oral Care Completed by SLP: No Oral Cavity - Dentition: Edentulous Vision: Functional for self-feeding Self-Feeding Abilities: Able to feed self Patient Positioning: Upright in bed;Postural control adequate for testing Baseline Vocal Quality: Normal Volitional Cough: Strong Volitional Swallow: Able to elicit    Oral/Motor/Sensory Function Overall Oral Motor/Sensory Function: Within functional limits   Ice Chips Ice chips:  Not tested   Thin  Liquid Thin Liquid: Impaired Presentation: Cup;Straw Pharyngeal  Phase Impairments:  (effortful swallows noted)    Nectar Thick Nectar Thick Liquid: Not tested   Honey Thick Honey Thick Liquid: Not tested   Puree Puree: Impaired Presentation: Spoon Pharyngeal Phase Impairments: Multiple swallows   Solid     Solid: Impaired Presentation: Self Fed Pharyngeal Phase Impairments: Throat Clearing - Immediate     Quinnten Calvin I. Vear Clock, MS, CCC-SLP Acute Rehabilitation Services Office number [phone] Pager [phone]  Scheryl Marten 12/07/2021,9:18 AM

## 2021-12-07 NOTE — Progress Notes (Signed)
[facility]  PROGRESS NOTE  Johnny Dunlap [id number] DOB: 05/16/1950 DOA: 12/05/2021 PCP: [facility]   Brief HPI:   71 year old male with medical history of cirrhosis, alcohol use, GI bleed, anemia, thrombocytopenia, COPD, generalized weakness presented with ongoing progressive weakness and dizziness. Patient said that he had have several weeks of progressive weakness and dizziness.  Feels lightheaded at times.  Also having difficulty with walking.  CT head showed no acute abnormality.    Subjective   Patient seen and examined, denies any complaints.   Assessment/Plan:     Weakness/dizziness -Unclear etiology, patient found to have elevated ammonia level; started on lactulose 30 g 3 times daily -Also having episodes of hypoglycemia; improved with nutrition -UA in the ED was abnormal, urine culture obtained -Follow urine culture results -Start empiric ceftriaxone 1 g IV every 24 hours -PT/OT consulted  Liver cirrhosis -Patient has known history of liver cirrhosis due to alcohol abuse -Also has thrombo-cytopenia at 54,000 due to liver cirrhosis -Labs are stable with normal ALT and AST -Continue thiamine and folate -Continue lactulose; ammonia was elevated at 113 on 12/05/2021.  We will recheck ammonia level in a.m. -Continue midodrine for hypotension -Lasix currently on hold  Anemia of chronic disease -Hemoglobin dropped 8.8 likely from dilution -Diuretics on hold due to hypotension -Follow CBC in a.m.     Medications     feeding supplement  237 mL Oral BID BM   folic acid  1 mg Oral Daily   gabapentin  400 mg Oral TID   lactulose  30 g Oral TID   midodrine  5 mg Oral TID WC   multivitamin with minerals  1 tablet Oral Daily   sodium chloride flush  3 mL Intravenous Q12H   tamsulosin  0.4 mg Oral Daily   thiamine  100 mg Oral Daily     Data Reviewed:   CBG:  Recent Labs  Lab 12/05/21 1814 12/06/21 0720 12/06/21 1134 12/06/21 1832  12/06/21 2045  GLUCAP 110* 76 87 102* 118*    SpO2: 94 %    Vitals:   12/07/21 0504 12/07/21 0506 12/07/21 0746 12/07/21 1207  BP: (!) 83/59 (!) 105/55 (!) 91/51 (!) 93/57  Pulse: 64  62 66  Resp:   17 17  Temp:   98.6 F (37 C) 98.7 F (37.1 C)  TempSrc:   Oral Oral  SpO2: 94%  96% 94%  Weight:      Height:        No intake or output data in the 24 hours ending 12/07/21 1546   12/06 1901 - 12/08 0700 In: 1889.1 [I.V.:790.1] Out: 901 [Urine:900]  Filed Weights   12/05/21 1741 12/07/21 0500  Weight: 68 kg 69.3 kg    Data Reviewed: Basic Metabolic Panel: Recent Labs  Lab 12/05/21 1524 12/06/21 0348 12/07/21 0145  NA 138 137 135  K 4.1 3.6 3.8  CL 109 108 110  CO2 21* 23 21*  GLUCOSE 56* 110* 76  BUN 20 20 19   CREATININE 1.11 1.17 1.02  CALCIUM 9.3 8.6* 8.4*   Liver Function Tests: Recent Labs  Lab 12/05/21 1524 12/06/21 0348 12/07/21 0145  AST 37 32 31  ALT 23 17 18   ALKPHOS 148* 122 114  BILITOT 3.4* 3.2* 2.6*  PROT 6.8 5.9* 5.4*  ALBUMIN 2.9* 2.5* 2.3*   No results for input(s): LIPASE, AMYLASE in the last 168 hours. Recent Labs  Lab 12/05/21 1524  AMMONIA 113*   CBC: Recent Labs  Lab 12/05/21 1524 12/06/21 0348 12/07/21 0145  WBC 4.5 3.2* 3.0*  NEUTROABS 2.7  --   --   HGB 10.8* 9.5* 8.8*  HCT 33.3* 28.8* 26.1*  MCV 101.2* 101.1* 98.9  PLT 54* 43* 38*   Cardiac Enzymes: No results for input(s): CKTOTAL, CKMB, CKMBINDEX, TROPONINI in the last 168 hours. BNP (last 3 results) Recent Labs    07/23/21 1328  BNP 278.9*    ProBNP (last 3 results) No results for input(s): PROBNP in the last 8760 hours.  CBG: Recent Labs  Lab 12/05/21 1814 12/06/21 0720 12/06/21 1134 12/06/21 1832 12/06/21 2045  GLUCAP 110* 76 87 102* 118*       Radiology Reports  CT Head Wo Contrast  Result Date: 12/05/2021 CLINICAL DATA:  Dizziness EXAM: CT HEAD WITHOUT CONTRAST TECHNIQUE: Contiguous axial images were obtained from the base of  the skull through the vertex without intravenous contrast. COMPARISON:  11/01/2021 FINDINGS: Brain: No evidence of acute infarction, hemorrhage, hydrocephalus, extra-axial collection or mass lesion/mass effect. Extensive low-density changes within the periventricular and subcortical white matter compatible with chronic microvascular ischemic change. Mild diffuse cerebral volume loss. Vascular: Atherosclerotic calcifications involving the large vessels of the skull base. No unexpected hyperdense vessel. Skull: Normal. Negative for fracture or focal lesion. Sinuses/Orbits: Similar mucosal thickening within the bilateral ethmoid air cells. No acute findings. Other: None. IMPRESSION: 1. No acute intracranial findings. 2. Stable advanced chronic microvascular ischemic change. Electronically Signed   By: Davina Poke D.O.   On: 12/05/2021 16:12       Antibiotics: Anti-infectives (From admission, onward)    Start     Dose/Rate Route Frequency Ordered Stop   12/06/21 1500  cefTRIAXone (ROCEPHIN) 1 g in sodium chloride 0.9 % 100 mL IVPB        1 g 200 mL/hr over 30 Minutes Intravenous Every 24 hours 12/06/21 1417           DVT prophylaxis: SCDs  Code Status: Full code  Family Communication: No family at bedside   Consultants:   Procedures:     Objective    Physical Examination:   General-appears in no acute distress Heart-S1-S2, regular, no murmur auscultated Lungs-clear to auscultation bilaterally, no wheezing or crackles auscultated Abdomen-soft, nontender, no organomegaly Extremities-no edema in the lower extremities Neuro-alert, oriented x3, no focal deficit noted  Status is: Inpatient  Dispo: The patient is from: Skilled nursing facility              Anticipated d/c is to: Skilled nursing facility              Anticipated d/c date is: 12/10/2019              Patient currently not stable for discharge  Barrier to discharge-ongoing evaluation for dizziness and  altered mental status  COVID-19 Labs  No results for input(s): DDIMER, FERRITIN, LDH, CRP in the last 72 hours.  Lab Results  Component Value Date   SARSCOV2NAA POSITIVE (A) 09/21/2021   SARSCOV2NAA POSITIVE (A) 08/01/2021   SARSCOV2NAA POSITIVE (A) 07/23/2021            Recent Results (from the past 240 hour(s))  Urine Culture     Status: Abnormal   Collection Time: 12/06/21  2:50 PM   Specimen: Urine, Clean Catch  Result Value Ref Range Status   Specimen Description URINE, CLEAN CATCH  Final   Special Requests   Final    NONE Performed at [facility], [place]  [address], [place], [place] [address]    Culture MULTIPLE SPECIES PRESENT, SUGGEST RECOLLECTION (A)  Final   Report Status 12/07/2021 FINAL  Final    Meredeth Ide   [facility] If 7PM-7AM, please contact night-coverage at [email or web address], Office  [phone]   12/07/2021, 3:46 PM  LOS: 0 days

## 2021-12-07 NOTE — NC FL2 (Signed)
[place] MEDICAID FL2 LEVEL OF CARE SCREENING TOOL     IDENTIFICATION  Patient Name: Johnny Dunlap Birthdate: [birth year] Sex: male Admission Date (Current Location): 12/05/2021  [facility] and [place] Number:  Producer, television/film/video and Address:  [facility]. [facility], [address], [place], [place] [address]      Provider Number: [id number]  Attending Physician Name and Address:  Meredeth Ide, MD  Relative Name and Phone Number:  N/A    Current Level of Care: Hospital Recommended Level of Care: Skilled Nursing Facility Prior Approval Number:    Date Approved/Denied:   PASRR Number: [id number]  Discharge Plan: SNF    Current Diagnoses: Patient Active Problem List   Diagnosis Date Noted   Weakness 12/05/2021   Acute hepatic encephalopathy 09/19/2021   CAP (community acquired pneumonia) 09/19/2021   Acute lower UTI 09/19/2021   Acute urinary retention 09/19/2021   Protein-calorie malnutrition, severe 08/11/2021   Sepsis (HCC) 08/01/2021   Left arm cellulitis    Lactic acidosis    Other cirrhosis of liver (HCC)    Thrombocytopenia (HCC)    COVID-19 virus infection    Iron deficiency anemia    Hypokalemia    GI bleed 07/23/2021   Alcohol abuse, daily use 02/16/2017   COPD (chronic obstructive pulmonary disease) (HCC) 02/16/2017   Generalized weakness 02/16/2017    Orientation RESPIRATION BLADDER Height & Weight     Self, Time, Situation, Place  Normal Continent Weight: 69.3 kg Height:  5\' 11"  (180.3 cm)  BEHAVIORAL SYMPTOMS/MOOD NEUROLOGICAL BOWEL NUTRITION STATUS      Continent Diet (see dc summary)  AMBULATORY STATUS COMMUNICATION OF NEEDS Skin   Extensive Assist Verbally Normal                       Personal Care Assistance Level of Assistance  Bathing, Dressing Bathing Assistance: Maximum assistance Feeding assistance: Maximum assistance Dressing Assistance: Maximum assistance     Functional Limitations Info              SPECIAL CARE FACTORS FREQUENCY  PT (By licensed PT), OT (By licensed OT)     PT Frequency: 5x week OT Frequency: 5x week            Contractures Contractures Info: Not present    Additional Factors Info   (blood sugar checks for hypoglycemia)               Current Medications (12/07/2021):  This is the current hospital active medication list Current Facility-Administered Medications  Medication Dose Route Frequency Provider Last Rate Last Admin   acetaminophen (TYLENOL) tablet 650 mg  650 mg Oral Q6H PRN 14/07/2021, MD   650 mg at 12/06/21 1959   Or   acetaminophen (TYLENOL) suppository 650 mg  650 mg Rectal Q6H PRN 14/07/22, MD       cefTRIAXone (ROCEPHIN) 1 g in sodium chloride 0.9 % 100 mL IVPB  1 g Intravenous Q24H Synetta Fail, MD   Stopped at 12/06/21 1529   feeding supplement (ENSURE ENLIVE / ENSURE PLUS) liquid 237 mL  237 mL Oral BID BM 14/07/22, MD   237 mL at 12/07/21 1000   folic acid (FOLVITE) tablet 1 mg  1 mg Oral Daily 14/08/22, MD   1 mg at 12/07/21 14/08/22   furosemide (LASIX) tablet 40 mg  40 mg Oral Daily PRN 1975, MD  gabapentin (NEURONTIN) capsule 400 mg  400 mg Oral TID Meredeth Ide, MD   400 mg at 12/07/21 0838   lactulose (CHRONULAC) 10 GM/15ML solution 30 g  30 g Oral TID Synetta Fail, MD   30 g at 12/07/21 0838   midodrine (PROAMATINE) tablet 5 mg  5 mg Oral TID WC Synetta Fail, MD   5 mg at 12/07/21 1153   multivitamin with minerals tablet 1 tablet  1 tablet Oral Daily Synetta Fail, MD   1 tablet at 12/07/21 6812   sodium chloride flush (NS) 0.9 % injection 3 mL  3 mL Intravenous Q12H Synetta Fail, MD   3 mL at 12/07/21 0842   tamsulosin (FLOMAX) capsule 0.4 mg  0.4 mg Oral Daily Synetta Fail, MD   0.4 mg at 12/07/21 7517   thiamine tablet 100 mg  100 mg Oral Daily Synetta Fail, MD   100 mg at 12/07/21 0017     Discharge Medications: Please  see discharge summary for a list of discharge medications.  Relevant Imaging Results:  Relevant Lab Results:   Additional Information SSN# [id number]  Lockie Pares, RN

## 2021-12-08 ENCOUNTER — Observation Stay (HOSPITAL_COMMUNITY): Payer: Medicare (Managed Care)

## 2021-12-08 DIAGNOSIS — K7469 Other cirrhosis of liver: Secondary | ICD-10-CM | POA: Diagnosis not present

## 2021-12-08 DIAGNOSIS — R531 Weakness: Secondary | ICD-10-CM | POA: Diagnosis not present

## 2021-12-08 DIAGNOSIS — D696 Thrombocytopenia, unspecified: Secondary | ICD-10-CM | POA: Diagnosis not present

## 2021-12-08 LAB — CBC
HCT: 27.3 % — ABNORMAL LOW (ref 39.0–52.0)
Hemoglobin: 9.2 g/dL — ABNORMAL LOW (ref 13.0–17.0)
MCH: 33.3 pg (ref 26.0–34.0)
MCHC: 33.7 g/dL (ref 30.0–36.0)
MCV: 98.9 fL (ref 80.0–100.0)
Platelets: 39 10*3/uL — ABNORMAL LOW (ref 150–400)
RBC: 2.76 MIL/uL — ABNORMAL LOW (ref 4.22–5.81)
RDW: 15.5 % (ref 11.5–15.5)
WBC: 3 10*3/uL — ABNORMAL LOW (ref 4.0–10.5)
nRBC: 0 % (ref 0.0–0.2)

## 2021-12-08 LAB — COMPREHENSIVE METABOLIC PANEL
ALT: 19 U/L (ref 0–44)
AST: 33 U/L (ref 15–41)
Albumin: 2.4 g/dL — ABNORMAL LOW (ref 3.5–5.0)
Alkaline Phosphatase: 106 U/L (ref 38–126)
Anion gap: 7 (ref 5–15)
BUN: 16 mg/dL (ref 8–23)
CO2: 22 mmol/L (ref 22–32)
Calcium: 8.4 mg/dL — ABNORMAL LOW (ref 8.9–10.3)
Chloride: 109 mmol/L (ref 98–111)
Creatinine, Ser: 0.98 mg/dL (ref 0.61–1.24)
GFR, Estimated: >60 mL/min (ref >60)
Glucose, Bld: 83 mg/dL (ref 70–99)
Potassium: 3.8 mmol/L (ref 3.5–5.1)
Sodium: 138 mmol/L (ref 135–145)
Total Bilirubin: 2.5 mg/dL — ABNORMAL HIGH (ref 0.3–1.2)
Total Protein: 5.6 g/dL — ABNORMAL LOW (ref 6.5–8.1)

## 2021-12-08 LAB — GLUCOSE, CAPILLARY
Glucose-Capillary: 108 mg/dL — ABNORMAL HIGH (ref 70–99)
Glucose-Capillary: 155 mg/dL — ABNORMAL HIGH (ref 70–99)
Glucose-Capillary: 132 mg/dL — ABNORMAL HIGH (ref 70–99)
Glucose-Capillary: 146 mg/dL — ABNORMAL HIGH (ref 70–99)

## 2021-12-08 NOTE — Discharge Instructions (Signed)
Medicare Outpatient Observation Notice   Patient name:  Johnny Dunlap Patient number:  [id number]                                                                                                                                                                       You're a hospital outpatient receiving observation services. You are not an inpatient because:    weakness   You require hospital care for evaluation and/or treatment.  It is expected you will need hospital care for less than a total of two days.                                                                                                                                                                         Being an outpatient may affect what you pay in a hospital:   When you're a hospital outpatient, your observation stay is covered under Medicare Part B.   For Part B services, you generally pay:   A copayment for each outpatient hospital service you get. Part B copayments may vary by type of service.   20% of the Medicare-approved amount for most doctor services, after the Part B deductible.   Observation services may affect coverage and payment of your care after you leave the hospital:     If you need skilled nursing facility (SNF) care after you leave the hospital, Medicare Part A will only cover SNF care if you've had a 3-day minimum, medically necessary, inpatient hospital stay for a related illness or injury. An inpatient hospital stay begins the day the hospital admits you as an inpatient based on a doctor's order and doesn't include the day you're discharged.   If you have Medicaid, a Medicare Advantage plan or other health plan, Medicaid or the plan may have different rules for SNF coverage after you leave the hospital. Check with Medicaid or your plan.   NOTE: Medicare Part A generally doesn't cover outpatient hospital services, like an  observation stay. However, Part A will generally cover medically necessary  inpatient services if the hospital admits you as an inpatient based on a doctor's order. In most cases, you'll pay a one-time deductible for all of your inpatient hospital services for the first 60 days you're in a hospital.                                                                                                                                                                      If you have any questions about your observation services, ask the hospital staff member giving you this notice or the doctor providing your hospital care. You can also ask to speak with someone from the hospital's utilization or discharge planning department.   You can also call 1-800-MEDICARE (1-775-303-0129).  TT[employer] users should call [phone].   Form CMS 67341-PFXT   Expiration 12/30/2021 [employer] APPROVAL [id number]          Your costs for medications:     Generally, prescription and over-the-counter drugs, including "self-administered drugs," you get in a hospital outpatient setting (like an emergency department) aren't covered by Part B. "Self- administered drugs" are drugs you'd normally take on your own. For safety reasons, many hospitals don't allow you to take medications brought from home. If you have a Medicare prescription drug plan (Part D), your plan may help you pay for these drugs. You'll likely need to pay out-of- pocket for these drugs and submit a claim to your drug plan for a refund. Contact your drug plan for more information.                                                                                                                                                                        If you're enrolled in a Medicare Advantage plan (like an HMO or PPO) or other Medicare health plan (Part C), your costs and coverage may be different. Check with your plan to find out about coverage for outpatient  observation services.    If you're a Qualified Medicare Beneficiary through your state  Medicaid program, you can't be billed for Part A or Part B deductibles, coinsurance, and copayments.                                                                                                                                                                      Additional Information (Optional):                                                                                                                                                                                Please sign below to show you received and understand this notice.                                         Date: 12/08/21 / Time:2:37 PM   CMS does not discriminate in its programs and activities. To request this publication in alternative format, please call: 1-800-MEDICARE or email:[email or web address].   Form CMS 10611-MOON   Expiration 12/30/2021 [employer] APPROVAL [phone]      Patient   Add No image attached Trace Slow Corrupt Edit Data Change Template Print On

## 2021-12-08 NOTE — Care Management (Signed)
Unable to make contact with patient regarding obs notice. Will continue to attempt.

## 2021-12-08 NOTE — Progress Notes (Signed)
[facility]  PROGRESS NOTE  Johnny Dunlap [id number] DOB: 30-Mar-1950 DOA: 12/05/2021 PCP: [facility]   Brief HPI:   71 year old male with medical history of cirrhosis, alcohol use, GI bleed, anemia, thrombocytopenia, COPD, generalized weakness presented with ongoing progressive weakness and dizziness. Patient said that he had have several weeks of progressive weakness and dizziness.  Feels lightheaded at times.  Also having difficulty with walking.  CT head showed no acute abnormality.    Subjective   Patient seen and examined, denies any complaints.   Assessment/Plan:     Weakness/dizziness -Unclear etiology, patient found to have elevated ammonia level; started on lactulose 30 g 3 times daily -Also having episodes of hypoglycemia; improved with nutrition -UA in the ED was abnormal, urine culture obtained -Urine culture grew multiple species -Patient was empirically started on ceftriaxone, will discontinue ceftriaxone -PT consulted  Liver cirrhosis -Patient has known history of liver cirrhosis due to alcohol abuse -Also has thrombo-cytopenia at 54,000 due to liver cirrhosis -Labs are stable with normal ALT and AST -Continue thiamine and folate -Continue lactulose; ammonia was elevated at 113 on 12/05/2021.  We will recheck ammonia level in a.m. -Continue midodrine for hypotension -Lasix currently on hold  Anemia of chronic disease -Hemoglobin dropped 8.8 likely from dilution -Today hemoglobin is 9.2 -Diuretics on hold due to hypotension -Follow CBC in a.m.     Medications     feeding supplement  237 mL Oral BID BM   folic acid  1 mg Oral Daily   gabapentin  400 mg Oral TID   lactulose  30 g Oral TID   midodrine  5 mg Oral TID WC   multivitamin with minerals  1 tablet Oral Daily   sodium chloride flush  3 mL Intravenous Q12H   tamsulosin  0.4 mg Oral Daily   thiamine  100 mg Oral Daily     Data Reviewed:   CBG:  Recent Labs  Lab  12/06/21 1832 12/06/21 2045 12/07/21 2037 12/08/21 0739 12/08/21 1144  GLUCAP 102* 118* 95 108* 155*    SpO2: 98 %    Vitals:   12/08/21 0500 12/08/21 0511 12/08/21 0736 12/08/21 1142  BP:  (!) 91/58 (!) 98/49 (!) 96/55  Pulse:  62 68 73  Resp:      Temp:  98.3 F (36.8 C) 98.4 F (36.9 C) 98.4 F (36.9 C)  TempSrc:  Oral Oral Oral  SpO2:  94% 96% 98%  Weight: 69.1 kg     Height:         Intake/Output Summary (Last 24 hours) at 12/08/2021 1422 Last data filed at 12/08/2021 0500 Gross per 24 hour  Intake --  Output 1600 ml  Net -1600 ml     12/07 1901 - 12/09 0700 In: -  Out: 1600 [Urine:1600]  Filed Weights   12/05/21 1741 12/07/21 0500 12/08/21 0500  Weight: 68 kg 69.3 kg 69.1 kg    Data Reviewed: Basic Metabolic Panel: Recent Labs  Lab 12/05/21 1524 12/06/21 0348 12/07/21 0145 12/08/21 0200  NA 138 137 135 138  K 4.1 3.6 3.8 3.8  CL 109 108 110 109  CO2 21* 23 21* 22  GLUCOSE 56* 110* 76 83  BUN 20 20 19 16   CREATININE 1.11 1.17 1.02 0.98  CALCIUM 9.3 8.6* 8.4* 8.4*   Liver Function Tests: Recent Labs  Lab 12/05/21 1524 12/06/21 0348 12/07/21 0145 12/08/21 0200  AST 37 32 31 33  ALT 23 17 18 19   ALKPHOS  148* 122 114 106  BILITOT 3.4* 3.2* 2.6* 2.5*  PROT 6.8 5.9* 5.4* 5.6*  ALBUMIN 2.9* 2.5* 2.3* 2.4*   No results for input(s): LIPASE, AMYLASE in the last 168 hours. Recent Labs  Lab 12/05/21 1524  AMMONIA 113*   CBC: Recent Labs  Lab 12/05/21 1524 12/06/21 0348 12/07/21 0145 12/08/21 0200  WBC 4.5 3.2* 3.0* 3.0*  NEUTROABS 2.7  --   --   --   HGB 10.8* 9.5* 8.8* 9.2*  HCT 33.3* 28.8* 26.1* 27.3*  MCV 101.2* 101.1* 98.9 98.9  PLT 54* 43* 38* 39*   Cardiac Enzymes: No results for input(s): CKTOTAL, CKMB, CKMBINDEX, TROPONINI in the last 168 hours. BNP (last 3 results) Recent Labs    07/23/21 1328  BNP 278.9*    ProBNP (last 3 results) No results for input(s): PROBNP in the last 8760 hours.  CBG: Recent Labs   Lab 12/06/21 1832 12/06/21 2045 12/07/21 2037 12/08/21 0739 12/08/21 1144  GLUCAP 102* 118* 95 108* 155*       Radiology Reports  DG Swallowing Func-Speech Pathology  Result Date: 12/08/2021 Table formatting from the original result was not included. Objective Swallowing Evaluation: Type of Study: MBS-Modified Barium Swallow Study  Patient Details Name: Johnny Dunlap MRN: [id number] Date of Birth: 29-Oct-1950 Today's Date: 12/08/2021 Time: SLP Start Time (ACUTE ONLY): 0845 -SLP Stop Time (ACUTE ONLY): 0903 SLP Time Calculation (min) (ACUTE ONLY): 18 min Past Medical History: Past Medical History: Diagnosis Date  Cirrhosis (HCC)   COPD (chronic obstructive pulmonary disease) (HCC)  Past Surgical History: Past Surgical History: Procedure Laterality Date  MOUTH SURGERY    NO PAST SURGERIES   HPI: Pt is a 71 y.o. male who presented with ongoing progressive weakness and dizziness. CT head negative. SLp consulted due to pt coughing while drinking liquid in the ED. PMH: cirrhosis, alcohol use, GI bleed, anemia, thrombocytopenia, COPD, generalized weakness, hepatic encephalopathy.  No data recorded  Recommendations for follow up therapy are one component of a multi-disciplinary discharge planning process, led by the attending physician.  Recommendations may be updated based on patient status, additional functional criteria and insurance authorization. Assessment / Plan / Recommendation Clinical Impressions 12/08/2021 Clinical Impression Pt presents with orohparyngeal dysphagia characterized by impaired bolus cohesion, reduced pharyngeal stripping, and reduced tongue base retraction. He demonstrated premature spillage, vallecular residue, pyriform sinus residue, posterior pharyngeal wall residue, and penetration (PAS 2,3) with consecutive swallows of thin liquids. Prompted coughing was ineffective in expelling penetrate. Residue increased with bolus size and advancement of consistency, but was improved with a  liquid wash. Pt's current diet of regular texture solids and thin liquids will be continued, and SLP will follow briefly for treatment. SLP Visit Diagnosis Dysphagia, unspecified (R13.10) Attention and concentration deficit following -- Frontal lobe and executive function deficit following -- Impact on safety and function Mild aspiration risk   Treatment Recommendations 12/08/2021 Treatment Recommendations Therapy as outlined in treatment plan below   Prognosis 12/08/2021 Prognosis for Safe Diet Advancement Good Barriers to Reach Goals Cognitive deficits Barriers/Prognosis Comment -- Diet Recommendations 12/08/2021 SLP Diet Recommendations Regular solids;Thin liquid Liquid Administration via Cup;Straw Medication Administration Whole meds with liquid Compensations Slow rate;Small sips/bites;Follow solids with liquid Postural Changes Seated upright at 90 degrees   Other Recommendations 12/08/2021 Recommended Consults -- Oral Care Recommendations Oral care BID Other Recommendations -- Follow Up Recommendations No SLP follow up Assistance recommended at discharge -- Functional Status Assessment Patient has not had a recent decline in their functional status Frequency  and Duration  12/08/2021 Speech Therapy Frequency (ACUTE ONLY) min 1 x/week Treatment Duration 1 week   Oral Phase 12/08/2021 Oral Phase Impaired Oral - Pudding Teaspoon -- Oral - Pudding Cup -- Oral - Honey Teaspoon -- Oral - Honey Cup -- Oral - Nectar Teaspoon -- Oral - Nectar Cup -- Oral - Nectar Straw Decreased bolus cohesion;Premature spillage Oral - Thin Teaspoon -- Oral - Thin Cup Decreased bolus cohesion;Premature spillage Oral - Thin Straw Decreased bolus cohesion;Premature spillage Oral - Puree WFL Oral - Mech Soft -- Oral - Regular WFL Oral - Multi-Consistency -- Oral - Pill WFL Oral Phase - Comment --  Pharyngeal Phase 12/08/2021 Pharyngeal Phase Impaired Pharyngeal- Pudding Teaspoon -- Pharyngeal -- Pharyngeal- Pudding Cup -- Pharyngeal -- Pharyngeal-  Honey Teaspoon -- Pharyngeal -- Pharyngeal- Honey Cup -- Pharyngeal -- Pharyngeal- Nectar Teaspoon -- Pharyngeal -- Pharyngeal- Nectar Cup -- Pharyngeal -- Pharyngeal- Nectar Straw Reduced pharyngeal peristalsis Pharyngeal -- Pharyngeal- Thin Teaspoon -- Pharyngeal -- Pharyngeal- Thin Cup Delayed swallow initiation-vallecula;Reduced tongue base retraction;Reduced pharyngeal peristalsis;Pharyngeal residue - valleculae;Pharyngeal residue - pyriform Pharyngeal -- Pharyngeal- Thin Straw Delayed swallow initiation-vallecula;Reduced tongue base retraction;Reduced pharyngeal peristalsis;Pharyngeal residue - valleculae;Pharyngeal residue - pyriform;Delayed swallow initiation-pyriform sinuses;Penetration/Aspiration during swallow Pharyngeal Material enters airway, remains ABOVE vocal cords then ejected out;Material enters airway, remains ABOVE vocal cords and not ejected out Pharyngeal- Puree Delayed swallow initiation-vallecula;Reduced tongue base retraction;Reduced pharyngeal peristalsis;Pharyngeal residue - valleculae;Pharyngeal residue - pyriform Pharyngeal -- Pharyngeal- Mechanical Soft -- Pharyngeal -- Pharyngeal- Regular Delayed swallow initiation-vallecula;Reduced tongue base retraction;Reduced pharyngeal peristalsis;Pharyngeal residue - valleculae;Pharyngeal residue - pyriform Pharyngeal -- Pharyngeal- Multi-consistency -- Pharyngeal -- Pharyngeal- Pill Delayed swallow initiation-vallecula;Reduced tongue base retraction;Reduced pharyngeal peristalsis;Pharyngeal residue - valleculae;Pharyngeal residue - pyriform Pharyngeal -- Pharyngeal Comment --  Cervical Esophageal Phase  12/08/2021 Cervical Esophageal Phase WFL Pudding Teaspoon -- Pudding Cup -- Honey Teaspoon -- Honey Cup -- Nectar Teaspoon -- Nectar Cup -- Nectar Straw -- Thin Teaspoon -- Thin Cup -- Thin Straw -- Puree -- Mechanical Soft -- Regular -- Multi-consistency -- Pill -- Cervical Esophageal Comment -- Shanika I. Hardin Negus, [place], [place] Office number [phone] Pager Barry 12/08/2021, 9:45 AM                         Antibiotics: Anti-infectives (From admission, onward)    Start     Dose/Rate Route Frequency Ordered Stop   12/06/21 1500  cefTRIAXone (ROCEPHIN) 1 g in sodium chloride 0.9 % 100 mL IVPB        1 g 200 mL/hr over 30 Minutes Intravenous Every 24 hours 12/06/21 1417           DVT prophylaxis: SCDs  Code Status: Full code  Family Communication: No family at bedside   Consultants:   Procedures:     Objective    Physical Examination:   General-appears in no acute distress Heart-S1-S2, regular, no murmur auscultated Lungs-clear to auscultation bilaterally, no wheezing or crackles auscultated Abdomen-soft, nontender, no organomegaly Extremities-no edema in the lower extremities Neuro-alert, oriented x3, no focal deficit noted  Status is: Inpatient  Dispo: The patient is from: Skilled nursing facility              Anticipated d/c is to: Skilled nursing facility              Anticipated d/c date is: 12/12/2021              Patient currently not stable for discharge  Barrier to discharge-ongoing evaluation for dizziness and altered mental status  COVID-19 Labs  No results for input(s): DDIMER, FERRITIN, LDH, CRP in the last 72 hours.  Lab Results  Component Value Date   SARSCOV2NAA POSITIVE (A) 09/21/2021   SARSCOV2NAA POSITIVE (A) 08/01/2021   SARSCOV2NAA POSITIVE (A) 07/23/2021            Recent Results (from the past 240 hour(s))  Urine Culture     Status: Abnormal   Collection Time: 12/06/21  2:50 PM   Specimen: Urine, Clean Catch  Result Value Ref Range Status   Specimen Description URINE, CLEAN CATCH  Final   Special Requests   Final    NONE Performed at [facility], [place] [address], [place], [place] [address]    Culture MULTIPLE SPECIES PRESENT, SUGGEST RECOLLECTION (A)  Final   Report Status  12/07/2021 FINAL  Final    Oswald Hillock   [facility] If 7PM-7AM, please contact night-coverage at [email or web address], Office  [phone]   12/08/2021, 2:22 PM  LOS: 0 days

## 2021-12-08 NOTE — Care Management Obs Status (Signed)
MEDICARE OBSERVATION STATUS NOTIFICATION   Patient Details  Name: Asim Gersten MRN: [id number] Date of Birth: [birth year]   Medicare Observation Status Notification Given:  Yes  Notice given over the phone, patient gave permission to sign that notice given. Placed notice on patient instructions  Lockie Pares, RN 12/08/2021, 2:38 PM

## 2021-12-08 NOTE — Progress Notes (Signed)
Occupational Therapy Treatment Patient Details Name: Johnny Dunlap MRN: [id number] DOB: [birth year] Today's Date: 12/08/2021   History of present illness Pt is a 71 y/o male admitted 12/6 secondary to dizziness and weakness. PMH includes cirrhosis, alcohol use, GI bleed, anemia, thrombocytopenia, COPD.   OT comments  Patient received in bed and eager to get into recliner. Patient required cues for pacing and safety for bed mobility and transfers. Patient was able to perform grooming and doffing and donning socks while seated in recliner. Patient making good progress with OT treatment requiring less assistance with bed mobility, transfers, and LB dressing. Acute OT to continue to follow.    Recommendations for follow up therapy are one component of a multi-disciplinary discharge planning process, led by the attending physician.  Recommendations may be updated based on patient status, additional functional criteria and insurance authorization.    Follow Up Recommendations  Skilled nursing-short term rehab (<3 hours/day)    Assistance Recommended at Discharge Frequent or constant Supervision/Assistance  Equipment Recommendations       Recommendations for Other Services      Precautions / Restrictions Precautions Precautions: Fall Restrictions Weight Bearing Restrictions: No       Mobility Bed Mobility Overal bed mobility: Needs Assistance Bed Mobility: Supine to Sit     Supine to sit: Min assist     General bed mobility comments: assistance with trunk    Transfers Overall transfer level: Needs assistance Equipment used: Rolling walker (2 wheels) Transfers: Sit to/from Stand Sit to Stand: Mod assist           General transfer comment: transferred from EOB to recliner     Balance Overall balance assessment: Needs assistance Sitting-balance support: No upper extremity supported;Feet supported Sitting balance-Leahy Scale: Fair     Standing balance support: Bilateral  upper extremity supported Standing balance-Leahy Scale: Poor Standing balance comment: reliant on RW while standing                           ADL either performed or assessed with clinical judgement   ADL Overall ADL's : Needs assistance/impaired     Grooming: Set up;Sitting Grooming Details (indicate cue type and reason): performed in recliner             Lower Body Dressing: Minimal assistance;Sitting/lateral leans Lower Body Dressing Details (indicate cue type and reason): doffed and donned socks in recliner               General ADL Comments: Patient requires cues for pacing and safety    Extremity/Trunk Assessment              Vision       Perception     Praxis      Cognition Arousal/Alertness: Awake/alert Behavior During Therapy: Impulsive Overall Cognitive Status: No family/caregiver present to determine baseline cognitive functioning                                 General Comments: required cues to pace self and safety Functional Status Assessment: Patient has not had a recent decline in their functional status        Exercises     Shoulder Instructions       General Comments      Pertinent Vitals/ Pain       Pain Assessment: No/denies pain  Home Living  Prior Functioning/Environment              Frequency  Min 2X/week        Progress Toward Goals  OT Goals(current goals can now be found in the care plan section)  Progress towards OT goals: Progressing toward goals  Acute Rehab OT Goals Patient Stated Goal: get stronger OT Goal Formulation: With patient Time For Goal Achievement: 12/20/21 Potential to Achieve Goals: Good ADL Goals Pt Will Perform Lower Body Bathing: with supervision;sit to/from stand Pt Will Perform Lower Body Dressing: with supervision;sit to/from stand Pt Will Transfer to Toilet: with  supervision;ambulating;bedside commode;stand pivot transfer Pt Will Perform Toileting - Clothing Manipulation and hygiene: with supervision Additional ADL Goal #1: Pt will verbalize 3 strategies to reduce risk of falls  Plan Discharge plan remains appropriate    Co-evaluation                 AM-PAC OT "6 Clicks" Daily Activity     Outcome Measure   Help from another person eating meals?: None Help from another person taking care of personal grooming?: A Little Help from another person toileting, which includes using toliet, bedpan, or urinal?: A Lot Help from another person bathing (including washing, rinsing, drying)?: A Lot Help from another person to put on and taking off regular upper body clothing?: A Little Help from another person to put on and taking off regular lower body clothing?: A Lot 6 Click Score: 16    End of Session Equipment Utilized During Treatment: Rolling walker (2 wheels)  OT Visit Diagnosis: Unsteadiness on feet (R26.81);Other abnormalities of gait and mobility (R26.89);Muscle weakness (generalized) (M62.81);History of falling (Z91.81);Other symptoms and signs involving cognitive function   Activity Tolerance Patient tolerated treatment well   Patient Left in chair;with call bell/phone within reach;with chair alarm set   Nurse Communication Mobility status        Time: 2800-3491 OT Time Calculation (min): 27 min  Charges: OT General Charges $OT Visit: 1 Visit OT Treatments $Self Care/Home Management : 23-37 mins  Johnny Dunlap, OTA Acute Rehabilitation Services  Pager [phone] Office [phone]   Johnny Dunlap 12/08/2021, 11:47 AM

## 2021-12-08 NOTE — Progress Notes (Signed)
Modified Barium Swallow Progress Note  Patient Details  Name: Johnny Dunlap MRN: [id number] Date of Birth: 16-Jul-1950  Today's Date: 12/08/2021  Modified Barium Swallow completed.  Full report located under Chart Review in the Imaging Section.  Brief recommendations include the following:  Clinical Impression  Pt presents with orohparyngeal dysphagia characterized by impaired bolus cohesion, reduced pharyngeal stripping, and reduced tongue base retraction. He demonstrated premature spillage, vallecular residue, pyriform sinus residue, posterior pharyngeal wall residue, and penetration (PAS 2,3) with consecutive swallows of thin liquids. Prompted coughing was ineffective in expelling penetrate. Residue increased with bolus size and advancement of consistency, but was improved with a liquid wash. Pt's current diet of regular texture solids and thin liquids will be continued, and SLP will follow briefly for treatment.   Swallow Evaluation Recommendations       SLP Diet Recommendations: Regular solids;Thin liquid   Liquid Administration via: Cup;Straw   Medication Administration: Whole meds with liquid   Supervision: Patient able to self feed   Compensations: Slow rate;Small sips/bites;Follow solids with liquid   Postural Changes: Seated upright at 90 degrees   Oral Care Recommendations: Oral care BID      Abir Craine I. Vear Clock, MS, CCC-SLP Acute Rehabilitation Services Office number [phone] Pager [phone]   Scheryl Marten 12/08/2021,9:42 AM

## 2021-12-08 NOTE — NC FL2 (Signed)
[place] MEDICAID FL2 LEVEL OF CARE SCREENING TOOL     IDENTIFICATION  Patient Name: Johnny Dunlap Birthdate: [birth year] Sex: male Admission Date (Current Location): 12/05/2021  [facility] and [place] Number:  Producer, television/film/video and Address:  [facility] [place], [address] [place], [place] [address]      Provider Number: [id number]  Attending Physician Name and Address:  Meredeth Ide, MD  Relative Name and Phone Number:  N/A    Current Level of Care: Hospital Recommended Level of Care: Skilled Nursing Facility Prior Approval Number:    Date Approved/Denied:   PASRR Number: [id number]  Discharge Plan: SNF    Current Diagnoses: Patient Active Problem List   Diagnosis Date Noted   Weakness 12/05/2021   Acute hepatic encephalopathy 09/19/2021   CAP (community acquired pneumonia) 09/19/2021   Acute lower UTI 09/19/2021   Acute urinary retention 09/19/2021   Protein-calorie malnutrition, severe 08/11/2021   Sepsis (HCC) 08/01/2021   Left arm cellulitis    Lactic acidosis    Other cirrhosis of liver (HCC)    Thrombocytopenia (HCC)    COVID-19 virus infection    Iron deficiency anemia    Hypokalemia    GI bleed 07/23/2021   Alcohol abuse, daily use 02/16/2017   COPD (chronic obstructive pulmonary disease) (HCC) 02/16/2017   Generalized weakness 02/16/2017    Orientation RESPIRATION BLADDER Height & Weight     Self, Time, Situation, Place  Normal Continent Weight: 152 lb 5.4 oz (69.1 kg) Height:  5\' 11"  (180.3 cm)  BEHAVIORAL SYMPTOMS/MOOD NEUROLOGICAL BOWEL NUTRITION STATUS      Continent Diet (see dc summary)  AMBULATORY STATUS COMMUNICATION OF NEEDS Skin   Extensive Assist Verbally Normal                       Personal Care Assistance Level of Assistance  Bathing, Feeding, Dressing Bathing Assistance: Limited assistance Feeding assistance: Limited assistance Dressing Assistance: Limited assistance     Functional Limitations  Info             SPECIAL CARE FACTORS FREQUENCY  PT (By licensed PT), OT (By licensed OT)     PT Frequency: 5x week OT Frequency: 5x week            Contractures Contractures Info: Not present    Additional Factors Info  Code Status, Allergies Code Status Info: Full Allergies Info: NKA           Current Medications (12/08/2021):  This is the current hospital active medication list Current Facility-Administered Medications  Medication Dose Route Frequency Provider Last Rate Last Admin   acetaminophen (TYLENOL) tablet 650 mg  650 mg Oral Q6H PRN 14/08/2021, MD   650 mg at 12/06/21 1959   Or   acetaminophen (TYLENOL) suppository 650 mg  650 mg Rectal Q6H PRN 14/07/22, MD       cefTRIAXone (ROCEPHIN) 1 g in sodium chloride 0.9 % 100 mL IVPB  1 g Intravenous Q24H Synetta Fail, MD 200 mL/hr at 12/07/21 1515 1 g at 12/07/21 1515   feeding supplement (ENSURE ENLIVE / ENSURE PLUS) liquid 237 mL  237 mL Oral BID BM 14/08/22, MD   237 mL at 12/08/21 0839   folic acid (FOLVITE) tablet 1 mg  1 mg Oral Daily 14/09/22, MD   1 mg at 12/08/21 0805   furosemide (LASIX) tablet 40 mg  40 mg Oral Daily PRN  Synetta Fail, MD       gabapentin (NEURONTIN) capsule 400 mg  400 mg Oral TID Meredeth Ide, MD   400 mg at 12/08/21 0805   lactulose (CHRONULAC) 10 GM/15ML solution 30 g  30 g Oral TID Synetta Fail, MD   30 g at 12/08/21 0805   midodrine (PROAMATINE) tablet 5 mg  5 mg Oral TID WC Synetta Fail, MD   5 mg at 12/08/21 0805   multivitamin with minerals tablet 1 tablet  1 tablet Oral Daily Synetta Fail, MD   1 tablet at 12/08/21 0805   sodium chloride flush (NS) 0.9 % injection 3 mL  3 mL Intravenous Q12H Synetta Fail, MD   3 mL at 12/08/21 0806   tamsulosin (FLOMAX) capsule 0.4 mg  0.4 mg Oral Daily Synetta Fail, MD   0.4 mg at 12/08/21 0805   thiamine tablet 100 mg  100 mg Oral Daily Synetta Fail, MD    100 mg at 12/08/21 5277     Discharge Medications: Please see discharge summary for a list of discharge medications.  Relevant Imaging Results:  Relevant Lab Results:   Additional Information SSN# [id number]  Mearl Latin, LCSW
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
## 2021-12-09 DIAGNOSIS — K7469 Other cirrhosis of liver: Secondary | ICD-10-CM | POA: Diagnosis not present

## 2021-12-09 DIAGNOSIS — R531 Weakness: Secondary | ICD-10-CM | POA: Diagnosis not present

## 2021-12-09 LAB — GLUCOSE, CAPILLARY
Glucose-Capillary: 87 mg/dL (ref 70–99)
Glucose-Capillary: 134 mg/dL — ABNORMAL HIGH (ref 70–99)
Glucose-Capillary: 109 mg/dL — ABNORMAL HIGH (ref 70–99)
Glucose-Capillary: 137 mg/dL — ABNORMAL HIGH (ref 70–99)

## 2021-12-09 MED ORDER — MELATONIN 3 MG PO TABS
3.0000 mg | ORAL_TABLET | Freq: Once | ORAL | Status: AC
  Administered 2021-12-10: 3 mg via ORAL
  Filled 2021-12-09: qty 1

## 2021-12-09 MED ORDER — ACETAMINOPHEN 325 MG PO TABS
325.0000 mg | ORAL_TABLET | Freq: Once | ORAL | Status: AC
  Administered 2021-12-10: 325 mg via ORAL

## 2021-12-09 NOTE — Progress Notes (Signed)
[facility]  PROGRESS NOTE  Johnny Dunlap [id number] DOB: 1950-10-03 DOA: 12/05/2021 PCP: [facility]   Brief HPI:   71 year old male with medical history of cirrhosis, alcohol use, GI bleed, anemia, thrombocytopenia, COPD, generalized weakness presented with ongoing progressive weakness and dizziness. Patient said that he had have several weeks of progressive weakness and dizziness.  Feels lightheaded at times.  Also having difficulty with walking.  CT head showed no acute abnormality.    Subjective   Patient seen and examined, denies any complaints.   Assessment/Plan:     Weakness/dizziness -Unclear etiology, patient found to have elevated ammonia level; started on lactulose 30 g 3 times daily -Also having episodes of hypoglycemia; improved with nutrition -UA in the ED was abnormal, urine culture obtained -Urine culture grew multiple species -Patient was empirically started on ceftriaxone, will discontinue ceftriaxone -PT consulted  Liver cirrhosis -Patient has known history of liver cirrhosis due to alcohol abuse -Also has thrombo-cytopenia at 54,000 due to liver cirrhosis -Labs are stable with normal ALT and AST -Continue thiamine and folate -Continue lactulose; ammonia was elevated at 113 on 12/05/2021.  We will recheck ammonia level in a.m. -Continue midodrine for hypotension -Lasix currently on hold  Anemia of chronic disease -Hemoglobin dropped 8.8 likely from dilution - hemoglobin is 9.2 -Diuretics on hold due to hypotension -Follow CBC in a.m.     Medications     feeding supplement  237 mL Oral BID BM   folic acid  1 mg Oral Daily   gabapentin  400 mg Oral TID   lactulose  30 g Oral TID   midodrine  5 mg Oral TID WC   multivitamin with minerals  1 tablet Oral Daily   sodium chloride flush  3 mL Intravenous Q12H   tamsulosin  0.4 mg Oral Daily   thiamine  100 mg Oral Daily     Data Reviewed:   CBG:  Recent Labs  Lab  12/08/21 1144 12/08/21 1546 12/08/21 2126 12/09/21 0756 12/09/21 1203  GLUCAP 155* 132* 146* 87 134*    SpO2: 95 %    Vitals:   12/08/21 2352 12/09/21 0546 12/09/21 0753 12/09/21 1202  BP: (!) 93/55 (!) 83/51 118/73 (!) 90/56  Pulse: 77 69 68 70  Resp: 19 18 18 18   Temp: 99.3 F (37.4 C) 98.8 F (37.1 C) 97.8 F (36.6 C) 98.1 F (36.7 C)  TempSrc: Oral Oral Oral Oral  SpO2: 96% 93% 92% 95%  Weight:      Height:         Intake/Output Summary (Last 24 hours) at 12/09/2021 1352 Last data filed at 12/08/2021 1918 Gross per 24 hour  Intake --  Output 1100 ml  Net -1100 ml     12/08 1901 - 12/10 0700 In: -  Out: 2700 [Urine:2700]  Filed Weights   12/05/21 1741 12/07/21 0500 12/08/21 0500  Weight: 68 kg 69.3 kg 69.1 kg    Data Reviewed: Basic Metabolic Panel: Recent Labs  Lab 12/05/21 1524 12/06/21 0348 12/07/21 0145 12/08/21 0200  NA 138 137 135 138  K 4.1 3.6 3.8 3.8  CL 109 108 110 109  CO2 21* 23 21* 22  GLUCOSE 56* 110* 76 83  BUN 20 20 19 16   CREATININE 1.11 1.17 1.02 0.98  CALCIUM 9.3 8.6* 8.4* 8.4*   Liver Function Tests: Recent Labs  Lab 12/05/21 1524 12/06/21 0348 12/07/21 0145 12/08/21 0200  AST 37 32 31 33  ALT 23 17 18  19  ALKPHOS 148* 122 114 106  BILITOT 3.4* 3.2* 2.6* 2.5*  PROT 6.8 5.9* 5.4* 5.6*  ALBUMIN 2.9* 2.5* 2.3* 2.4*   No results for input(s): LIPASE, AMYLASE in the last 168 hours. Recent Labs  Lab 12/05/21 1524  AMMONIA 113*   CBC: Recent Labs  Lab 12/05/21 1524 12/06/21 0348 12/07/21 0145 12/08/21 0200  WBC 4.5 3.2* 3.0* 3.0*  NEUTROABS 2.7  --   --   --   HGB 10.8* 9.5* 8.8* 9.2*  HCT 33.3* 28.8* 26.1* 27.3*  MCV 101.2* 101.1* 98.9 98.9  PLT 54* 43* 38* 39*   Cardiac Enzymes: No results for input(s): CKTOTAL, CKMB, CKMBINDEX, TROPONINI in the last 168 hours. BNP (last 3 results) Recent Labs    07/23/21 1328  BNP 278.9*    ProBNP (last 3 results) No results for input(s): PROBNP in the last  8760 hours.  CBG: Recent Labs  Lab 12/08/21 1144 12/08/21 1546 12/08/21 2126 12/09/21 0756 12/09/21 1203  GLUCAP 155* 132* 146* 66 134*       Radiology Reports  DG Swallowing Func-Speech Pathology  Result Date: 12/08/2021 Table formatting from the original result was not included. Objective Swallowing Evaluation: Type of Study: MBS-Modified Barium Swallow Study  Patient Details Name: Johnny Dunlap MRN: [id number] Date of Birth: 1950-12-06 Today's Date: 12/08/2021 Time: SLP Start Time (ACUTE ONLY): 0845 -SLP Stop Time (ACUTE ONLY): 0903 SLP Time Calculation (min) (ACUTE ONLY): 18 min Past Medical History: Past Medical History: Diagnosis Date  Cirrhosis ([place])   COPD (chronic obstructive pulmonary disease) ([place])  Past Surgical History: Past Surgical History: Procedure Laterality Date  MOUTH SURGERY    NO PAST SURGERIES   HPI: Pt is a 71 y.o. male who presented with ongoing progressive weakness and dizziness. CT head negative. SLp consulted due to pt coughing while drinking liquid in the ED. PMH: cirrhosis, alcohol use, GI bleed, anemia, thrombocytopenia, COPD, generalized weakness, hepatic encephalopathy.  No data recorded  Recommendations for follow up therapy are one component of a multi-disciplinary discharge planning process, led by the attending physician.  Recommendations may be updated based on patient status, additional functional criteria and insurance authorization. Assessment / Plan / Recommendation Clinical Impressions 12/08/2021 Clinical Impression Pt presents with orohparyngeal dysphagia characterized by impaired bolus cohesion, reduced pharyngeal stripping, and reduced tongue base retraction. He demonstrated premature spillage, vallecular residue, pyriform sinus residue, posterior pharyngeal wall residue, and penetration (PAS 2,3) with consecutive swallows of thin liquids. Prompted coughing was ineffective in expelling penetrate. Residue increased with bolus size and advancement of  consistency, but was improved with a liquid wash. Pt's current diet of regular texture solids and thin liquids will be continued, and SLP will follow briefly for treatment. SLP Visit Diagnosis Dysphagia, unspecified (R13.10) Attention and concentration deficit following -- Frontal lobe and executive function deficit following -- Impact on safety and function Mild aspiration risk   Treatment Recommendations 12/08/2021 Treatment Recommendations Therapy as outlined in treatment plan below   Prognosis 12/08/2021 Prognosis for Safe Diet Advancement Good Barriers to Reach Goals Cognitive deficits Barriers/Prognosis Comment -- Diet Recommendations 12/08/2021 SLP Diet Recommendations Regular solids;Thin liquid Liquid Administration via Cup;Straw Medication Administration Whole meds with liquid Compensations Slow rate;Small sips/bites;Follow solids with liquid Postural Changes Seated upright at 90 degrees   Other Recommendations 12/08/2021 Recommended Consults -- Oral Care Recommendations Oral care BID Other Recommendations -- Follow Up Recommendations No SLP follow up Assistance recommended at discharge -- Functional Status Assessment Patient has not had a recent decline in their functional status  Frequency and Duration  12/08/2021 Speech Therapy Frequency (ACUTE ONLY) min 1 x/week Treatment Duration 1 week   Oral Phase 12/08/2021 Oral Phase Impaired Oral - Pudding Teaspoon -- Oral - Pudding Cup -- Oral - Honey Teaspoon -- Oral - Honey Cup -- Oral - Nectar Teaspoon -- Oral - Nectar Cup -- Oral - Nectar Straw Decreased bolus cohesion;Premature spillage Oral - Thin Teaspoon -- Oral - Thin Cup Decreased bolus cohesion;Premature spillage Oral - Thin Straw Decreased bolus cohesion;Premature spillage Oral - Puree WFL Oral - Mech Soft -- Oral - Regular WFL Oral - Multi-Consistency -- Oral - Pill WFL Oral Phase - Comment --  Pharyngeal Phase 12/08/2021 Pharyngeal Phase Impaired Pharyngeal- Pudding Teaspoon -- Pharyngeal -- Pharyngeal-  Pudding Cup -- Pharyngeal -- Pharyngeal- Honey Teaspoon -- Pharyngeal -- Pharyngeal- Honey Cup -- Pharyngeal -- Pharyngeal- Nectar Teaspoon -- Pharyngeal -- Pharyngeal- Nectar Cup -- Pharyngeal -- Pharyngeal- Nectar Straw Reduced pharyngeal peristalsis Pharyngeal -- Pharyngeal- Thin Teaspoon -- Pharyngeal -- Pharyngeal- Thin Cup Delayed swallow initiation-vallecula;Reduced tongue base retraction;Reduced pharyngeal peristalsis;Pharyngeal residue - valleculae;Pharyngeal residue - pyriform Pharyngeal -- Pharyngeal- Thin Straw Delayed swallow initiation-vallecula;Reduced tongue base retraction;Reduced pharyngeal peristalsis;Pharyngeal residue - valleculae;Pharyngeal residue - pyriform;Delayed swallow initiation-pyriform sinuses;Penetration/Aspiration during swallow Pharyngeal Material enters airway, remains ABOVE vocal cords then ejected out;Material enters airway, remains ABOVE vocal cords and not ejected out Pharyngeal- Puree Delayed swallow initiation-vallecula;Reduced tongue base retraction;Reduced pharyngeal peristalsis;Pharyngeal residue - valleculae;Pharyngeal residue - pyriform Pharyngeal -- Pharyngeal- Mechanical Soft -- Pharyngeal -- Pharyngeal- Regular Delayed swallow initiation-vallecula;Reduced tongue base retraction;Reduced pharyngeal peristalsis;Pharyngeal residue - valleculae;Pharyngeal residue - pyriform Pharyngeal -- Pharyngeal- Multi-consistency -- Pharyngeal -- Pharyngeal- Pill Delayed swallow initiation-vallecula;Reduced tongue base retraction;Reduced pharyngeal peristalsis;Pharyngeal residue - valleculae;Pharyngeal residue - pyriform Pharyngeal -- Pharyngeal Comment --  Cervical Esophageal Phase  12/08/2021 Cervical Esophageal Phase WFL Pudding Teaspoon -- Pudding Cup -- Honey Teaspoon -- Honey Cup -- Nectar Teaspoon -- Nectar Cup -- Nectar Straw -- Thin Teaspoon -- Thin Cup -- Thin Straw -- Puree -- Mechanical Soft -- Regular -- Multi-consistency -- Pill -- Cervical Esophageal Comment -- Shanika I.  Hardin Negus, [place], [place] Office number [phone] Pager [place] 12/08/2021, 9:45 AM                         Antibiotics: Anti-infectives (From admission, onward)    Start     Dose/Rate Route Frequency Ordered Stop   12/06/21 1500  cefTRIAXone (ROCEPHIN) 1 g in sodium chloride 0.9 % 100 mL IVPB  Status:  Discontinued        1 g 200 mL/hr over 30 Minutes Intravenous Every 24 hours 12/06/21 1417 12/09/21 1228         DVT prophylaxis: SCDs  Code Status: Full code  Family Communication: No family at bedside   Consultants:   Procedures:     Objective    Physical Examination:   General-appears in no acute distress Heart-S1-S2, regular, no murmur auscultated Lungs-clear to auscultation bilaterally, no wheezing or crackles auscultated Abdomen-soft, nontender, no organomegaly Extremities-no edema in the lower extremities Neuro-alert, oriented x3, no focal deficit noted  Status is: Inpatient  Dispo: The patient is from: Skilled nursing facility              Anticipated d/c is to: Skilled nursing facility              Anticipated d/c date is: 12/12/2021              Patient  currently not stable for discharge  Barrier to discharge-ongoing evaluation for dizziness and altered mental status  COVID-19 Labs  No results for input(s): DDIMER, FERRITIN, LDH, CRP in the last 72 hours.  Lab Results  Component Value Date   SARSCOV2NAA POSITIVE (A) 09/21/2021   SARSCOV2NAA POSITIVE (A) 08/01/2021   SARSCOV2NAA POSITIVE (A) 07/23/2021            Recent Results (from the past 240 hour(s))  Urine Culture     Status: Abnormal   Collection Time: 12/06/21  2:50 PM   Specimen: Urine, Clean Catch  Result Value Ref Range Status   Specimen Description URINE, CLEAN CATCH  Final   Special Requests   Final    NONE Performed at [facility], [place] [address], [place], [place] [address]    Culture MULTIPLE SPECIES  PRESENT, SUGGEST RECOLLECTION (A)  Final   Report Status 12/07/2021 FINAL  Final    Oswald Hillock   [facility] If 7PM-7AM, please contact night-coverage at [email or web address], Office  [phone]   12/09/2021, 1:52 PM  LOS: 0 days

## 2021-12-09 NOTE — TOC Progression Note (Signed)
Transition of Care [facility]) - Progression Note    Patient Details  Name: Johnny Dunlap MRN: [id number] Date of Birth: 02-02-1950  [facility]) CM/SW Contact  Jasminemarie Sherrard [place], [place] Phone [phone] 12/09/2021, 10:22 AM  Clinical Narrative:     Phone call to the [facility] with Revonda Standard regarding status of patient's insurance authorization. Authorization pending-not expected to return until Monday.  Transition of Care to continue to follow  [facility], LCSW Transition of Care [phone]   Expected Discharge Plan: Skilled Nursing Facility Barriers to Discharge: No Barriers Identified  Expected Discharge Plan and Services Expected Discharge Plan: Skilled Nursing Facility In-house Referral: Clinical Social Work   Post Acute Care Choice: Skilled Nursing Facility Living arrangements for the past 2 months: Skilled Nursing Facility                                       Social Determinants of Health (SDOH) Interventions    Readmission Risk Interventions No flowsheet data found.

## 2021-12-10 ENCOUNTER — Observation Stay (HOSPITAL_COMMUNITY): Payer: Medicare (Managed Care)

## 2021-12-10 DIAGNOSIS — K7469 Other cirrhosis of liver: Secondary | ICD-10-CM | POA: Diagnosis not present

## 2021-12-10 DIAGNOSIS — R531 Weakness: Secondary | ICD-10-CM | POA: Diagnosis not present

## 2021-12-10 NOTE — Progress Notes (Signed)
[facility]  PROGRESS NOTE  Regory Cluney QZR:[id number] DOB: Mar 01, 1950 DOA: 12/05/2021 PCP: [facility]   Brief HPI:   71 year old male with medical history of cirrhosis, alcohol use, GI bleed, anemia, thrombocytopenia, COPD, generalized weakness presented with ongoing progressive weakness and dizziness. Patient said that he had have several weeks of progressive weakness and dizziness.  Feels lightheaded at times.  Also having difficulty with walking.  CT head showed no acute abnormality.    Subjective   Patient seen and examined, sleepy this morning.  Says he could not sleep well last night.   Assessment/Plan:     Weakness/dizziness -Unclear etiology, patient found to have elevated ammonia level; started on lactulose 30 g 3 times daily -Also having episodes of hypoglycemia; improved with nutrition -UA in the ED was abnormal, urine culture obtained -Urine culture grew multiple species -Patient was empirically started on ceftriaxone, will discontinue ceftriaxone -PT consulted  Liver cirrhosis -Patient has known history of liver cirrhosis due to alcohol abuse -Also has thrombo-cytopenia at 54,000 due to liver cirrhosis -Labs are stable with normal ALT and AST -Continue thiamine and folate -Continue lactulose; ammonia was elevated at 113 on 12/05/2021.  We will recheck ammonia level in a.m. -Continue midodrine for hypotension -Lasix currently on hold  Anemia of chronic disease -Hemoglobin dropped 8.8 likely from dilution - hemoglobin is 9.2 -Diuretics on hold due to hypotension -Follow CBC in a.m.     Medications     feeding supplement  237 mL Oral BID BM   folic acid  1 mg Oral Daily   gabapentin  400 mg Oral TID   lactulose  30 g Oral TID   midodrine  5 mg Oral TID WC   multivitamin with minerals  1 tablet Oral Daily   sodium chloride flush  3 mL Intravenous Q12H   tamsulosin  0.4 mg Oral Daily   thiamine  100 mg Oral Daily     Data  Reviewed:   CBG:  Recent Labs  Lab 12/08/21 2126 12/09/21 0756 12/09/21 1203 12/09/21 1605 12/09/21 2204  GLUCAP 146* 87 134* 109* 137*    SpO2: 94 %    Vitals:   12/10/21 0807 12/10/21 0920 12/10/21 1157 12/10/21 1200  BP: (!) 83/44 (!) 85/59 (!) 89/53 (!) 93/54  Pulse: 60 60 69 67  Resp: 18  18   Temp: 98.5 F (36.9 C)     TempSrc: Oral     SpO2: 93% 98% 93% 94%  Weight:      Height:         Intake/Output Summary (Last 24 hours) at 12/10/2021 1446 Last data filed at 12/09/2021 2347 Gross per 24 hour  Intake --  Output 425 ml  Net -425 ml     12/09 1901 - 12/11 0700 In: -  Out: 2025 [Urine:2025]  Filed Weights   12/07/21 0500 12/08/21 0500 12/10/21 0603  Weight: 69.3 kg 69.1 kg 68.5 kg    Data Reviewed: Basic Metabolic Panel: Recent Labs  Lab 12/05/21 1524 12/06/21 0348 12/07/21 0145 12/08/21 0200  NA 138 137 135 138  K 4.1 3.6 3.8 3.8  CL 109 108 110 109  CO2 21* 23 21* 22  GLUCOSE 56* 110* 76 83  BUN 20 20 19 16   CREATININE 1.11 1.17 1.02 0.98  CALCIUM 9.3 8.6* 8.4* 8.4*   Liver Function Tests: Recent Labs  Lab 12/05/21 1524 12/06/21 0348 12/07/21 0145 12/08/21 0200  AST 37 32 31 33  ALT 23 17 18  19  ALKPHOS 148* 122 114 106  BILITOT 3.4* 3.2* 2.6* 2.5*  PROT 6.8 5.9* 5.4* 5.6*  ALBUMIN 2.9* 2.5* 2.3* 2.4*   No results for input(s): LIPASE, AMYLASE in the last 168 hours. Recent Labs  Lab 12/05/21 1524  AMMONIA 113*   CBC: Recent Labs  Lab 12/05/21 1524 12/06/21 0348 12/07/21 0145 12/08/21 0200  WBC 4.5 3.2* 3.0* 3.0*  NEUTROABS 2.7  --   --   --   HGB 10.8* 9.5* 8.8* 9.2*  HCT 33.3* 28.8* 26.1* 27.3*  MCV 101.2* 101.1* 98.9 98.9  PLT 54* 43* 38* 39*   Cardiac Enzymes: No results for input(s): CKTOTAL, CKMB, CKMBINDEX, TROPONINI in the last 168 hours. BNP (last 3 results) Recent Labs    07/23/21 1328  BNP 278.9*    ProBNP (last 3 results) No results for input(s): PROBNP in the last 8760  hours.  CBG: Recent Labs  Lab 12/08/21 2126 12/09/21 0756 12/09/21 1203 12/09/21 1605 12/09/21 2204  GLUCAP 146* 87 134* 109* 137*       Radiology Reports  DG Hand 2 View Left  Result Date: 12/10/2021 CLINICAL DATA:  Pain EXAM: LEFT HAND - 2 VIEW COMPARISON:  None. FINDINGS: No fracture or dislocation is seen. The joint spaces are preserved. The visualized soft tissues are unremarkable. IMPRESSION: Negative. Electronically Signed   By: Charline Bills M.D.   On: 12/10/2021 00:42       Antibiotics: Anti-infectives (From admission, onward)    Start     Dose/Rate Route Frequency Ordered Stop   12/06/21 1500  cefTRIAXone (ROCEPHIN) 1 g in sodium chloride 0.9 % 100 mL IVPB  Status:  Discontinued        1 g 200 mL/hr over 30 Minutes Intravenous Every 24 hours 12/06/21 1417 12/09/21 1228         DVT prophylaxis: SCDs  Code Status: Full code  Family Communication: No family at bedside   Consultants:   Procedures:     Objective    Physical Examination:   General-appears in no acute distress Heart-S1-S2, regular, no murmur auscultated Lungs-clear to auscultation bilaterally, no wheezing or crackles auscultated Abdomen-soft, nontender, no organomegaly Extremities-no edema in the lower extremities Neuro-alert, oriented x3, no focal deficit noted  Status is: Inpatient  Dispo: The patient is from: Skilled nursing facility              Anticipated d/c is to: Skilled nursing facility              Anticipated d/c date is: 12/12/2021              Patient currently not stable for discharge  Barrier to discharge-ongoing evaluation for dizziness and altered mental status  COVID-19 Labs  No results for input(s): DDIMER, FERRITIN, LDH, CRP in the last 72 hours.  Lab Results  Component Value Date   SARSCOV2NAA POSITIVE (A) 09/21/2021   SARSCOV2NAA POSITIVE (A) 08/01/2021   SARSCOV2NAA POSITIVE (A) 07/23/2021            Recent Results (from the  past 240 hour(s))  Urine Culture     Status: Abnormal   Collection Time: 12/06/21  2:50 PM   Specimen: Urine, Clean Catch  Result Value Ref Range Status   Specimen Description URINE, CLEAN CATCH  Final   Special Requests   Final    NONE Performed at [facility], [address], [place], [place] [address]    Culture MULTIPLE SPECIES PRESENT, SUGGEST RECOLLECTION (A)  Final   Report Status 12/07/2021 FINAL  Final    Oswald Hillock   [facility] If 7PM-7AM, please contact night-coverage at [email or web address], Office  [phone]   12/10/2021, 2:46 PM  LOS: 0 days

## 2021-12-11 DIAGNOSIS — R531 Weakness: Secondary | ICD-10-CM | POA: Diagnosis not present

## 2021-12-11 LAB — COMPREHENSIVE METABOLIC PANEL
ALT: 19 U/L (ref 0–44)
AST: 35 U/L (ref 15–41)
Albumin: 2.3 g/dL — ABNORMAL LOW (ref 3.5–5.0)
Alkaline Phosphatase: 131 U/L — ABNORMAL HIGH (ref 38–126)
Anion gap: 7 (ref 5–15)
BUN: 22 mg/dL (ref 8–23)
CO2: 23 mmol/L (ref 22–32)
Calcium: 9 mg/dL (ref 8.9–10.3)
Chloride: 109 mmol/L (ref 98–111)
Creatinine, Ser: 1.05 mg/dL (ref 0.61–1.24)
GFR, Estimated: >60 mL/min (ref >60)
Glucose, Bld: 101 mg/dL — ABNORMAL HIGH (ref 70–99)
Potassium: 4.1 mmol/L (ref 3.5–5.1)
Sodium: 139 mmol/L (ref 135–145)
Total Bilirubin: 2.3 mg/dL — ABNORMAL HIGH (ref 0.3–1.2)
Total Protein: 5.6 g/dL — ABNORMAL LOW (ref 6.5–8.1)

## 2021-12-11 LAB — CBC
HCT: 27.7 % — ABNORMAL LOW (ref 39.0–52.0)
Hemoglobin: 9.2 g/dL — ABNORMAL LOW (ref 13.0–17.0)
MCH: 33.3 pg (ref 26.0–34.0)
MCHC: 33.2 g/dL (ref 30.0–36.0)
MCV: 100.4 fL — ABNORMAL HIGH (ref 80.0–100.0)
Platelets: 41 10*3/uL — ABNORMAL LOW (ref 150–400)
RBC: 2.76 MIL/uL — ABNORMAL LOW (ref 4.22–5.81)
RDW: 15.7 % — ABNORMAL HIGH (ref 11.5–15.5)
WBC: 3.4 10*3/uL — ABNORMAL LOW (ref 4.0–10.5)
nRBC: 0 % (ref 0.0–0.2)

## 2021-12-11 LAB — AMMONIA: Ammonia: 73 umol/L — ABNORMAL HIGH (ref 9–35)

## 2021-12-11 LAB — GLUCOSE, CAPILLARY: Glucose-Capillary: 103 mg/dL — ABNORMAL HIGH (ref 70–99)

## 2021-12-11 NOTE — Progress Notes (Signed)
Speech Language Pathology Treatment: Dysphagia  Patient Details Name: Johnny Dunlap MRN: [id number] DOB: 07-26-1950 Today's Date: 12/11/2021 Time: 8786-7672 SLP Time Calculation (min) (ACUTE ONLY): 14 min  Assessment / Plan / Recommendation Clinical Impression  Pt was seen for dysphagia treatment. Pt and his nurse reported that the pt has been tolerating the current diet without signs of aspiration or other difficulty . Pt was educated regarding the results of the modified barium swallow study, diet recommendations, and swallowing precautions. Video recording of the study was used to facilitate education and he verbalized understanding regarding all areas of education. Pt demonstrated understanding by implementation of swallowing precautions. No s/sx of aspiration were noted with observance of swallowing precautions or when he drank liquids in a reclined position when SLP was entering the room. Pt's current diet of regular texture solids and thin liquids will be continued at this time with observance of swallowing precautions. Intermittent supervision is recommended to reinforce use of compensatory strategies. Further skilled SLP services are not clinically indicated at this time.   HPI HPI: Pt is a 71 y.o. male who presented with ongoing progressive weakness and dizziness. CT head negative. SLp consulted due to pt coughing while drinking liquid in the ED. PMH: cirrhosis, alcohol use, GI bleed, anemia, thrombocytopenia, COPD, generalized weakness, hepatic encephalopathy.      SLP Plan  All goals met;Discharge SLP treatment due to (comment)      Recommendations for follow up therapy are one component of a multi-disciplinary discharge planning process, led by the attending physician.  Recommendations may be updated based on patient status, additional functional criteria and insurance authorization.    Recommendations  Diet recommendations: Regular;Thin liquid Liquids provided via:  Cup;Straw Medication Administration: Whole meds with liquid Supervision: Intermittent supervision to cue for compensatory strategies;Patient able to self feed Compensations: Slow rate;Small sips/bites;Follow solids with liquid (avoid consecutive swallows) Postural Changes and/or Swallow Maneuvers: Seated upright 90 degrees                Oral Care Recommendations: Oral care BID Follow Up Recommendations: No SLP follow up Assistance recommended at discharge: None SLP Visit Diagnosis: Dysphagia, unspecified (R13.10) Plan: All goals met;Discharge SLP treatment due to (comment)       Johnny Dunlap I. Hardin Negus, [place], [place] Office number [phone] Pager [place]  12/11/2021, 11:21 AM

## 2021-12-11 NOTE — TOC Progression Note (Signed)
Transition of Care [facility]) - Progression Note    Patient Details  Name: Johnny Dunlap MRN: [id number] Date of Birth: 10/08/1950  Transition of Care [facility]) CM/SW Contact  Carley Hammed, [place] Phone Number: 12/11/2021, 9:59 AM  Clinical Narrative:    CSW was notified by RN that pt was concerned, because his check is still going to [facility]. CSW spoke with [facility] who noted Berkley Harvey is still pending at this time. CSW asked about the check and facility will work with DSS to get it changed to [place]. TOC will continue to follow for DC.   Expected Discharge Plan: Skilled Nursing Facility Barriers to Discharge: No Barriers Identified  Expected Discharge Plan and Services Expected Discharge Plan: Skilled Nursing Facility In-house Referral: Clinical Social Work   Post Acute Care Choice: Skilled Nursing Facility Living arrangements for the past 2 months: Skilled Nursing Facility                                       Social Determinants of Health (SDOH) Interventions    Readmission Risk Interventions No flowsheet data found.

## 2021-12-11 NOTE — Progress Notes (Signed)
[facility]  PROGRESS NOTE  Johnny Dunlap [id number] DOB: January 12, 1950 DOA: 12/05/2021 [facility]: [facility]   Brief HPI:   71 year old male with medical history of cirrhosis, alcohol use, GI bleed, anemia, thrombocytopenia, COPD, generalized weakness presented with ongoing progressive weakness and dizziness. Patient said that he had have several weeks of progressive weakness and dizziness.  Feels lightheaded at times.  Also having difficulty with walking.  CT head showed no acute abnormality.    Subjective   Patient seen and examined, denies any complaints.   Assessment/Plan:     Weakness/dizziness -Improved -Unclear etiology, patient found to have elevated ammonia level; started on lactulose 30 g 3 times daily -Also having episodes of hypoglycemia; improved with nutrition -UA in the ED was abnormal, urine culture obtained -Urine culture grew multiple species -Patient was empirically started on ceftriaxone, ceftriaxone has been discontinued -PT consulted; plan to go to skilled nursing facility  Liver cirrhosis -Patient has known history of liver cirrhosis due to alcohol abuse -Also has thrombo-cytopenia at 54,000 due to liver cirrhosis -Labs are stable with normal ALT and AST -Continue thiamine and folate -Continue lactulose; ammonia was elevated at 113 on 12/05/2021.  We will recheck ammonia level in a.m. -Continue midodrine for hypotension -Lasix currently on hold  Anemia of chronic disease -Hemoglobin dropped 8.8 likely from dilution - hemoglobin is 9.2 -Diuretics on hold due to hypotension -Follow CBC in a.m.   Left hand pain -Resolved -X-ray of the left hand was unremarkable  Medications     feeding supplement  237 mL Oral BID BM   folic acid  1 mg Oral Daily   gabapentin  400 mg Oral TID   lactulose  30 g Oral TID   midodrine  5 mg Oral TID WC   multivitamin with minerals  1 tablet Oral Daily   sodium chloride flush  3 mL Intravenous  Q12H   tamsulosin  0.4 mg Oral Daily   thiamine  100 mg Oral Daily     Data Reviewed:   CBG:  Recent Labs  Lab 12/08/21 2126 12/09/21 0756 12/09/21 1203 12/09/21 1605 12/09/21 2204  GLUCAP 146* 87 134* 109* 137*    SpO2: 97 %    Vitals:   12/11/21 0510 12/11/21 0751 12/11/21 0835 12/11/21 1145  BP: 96/60 (!) 85/56 99/60 (!) 94/54  Pulse: 67 69  72  Resp: 19 17  17   Temp: 98.5 F (36.9 C) 97.7 F (36.5 C)  97.8 F (36.6 C)  TempSrc: Oral Oral  Oral  SpO2: 94% 97%  97%  Weight:      Height:         Intake/Output Summary (Last 24 hours) at 12/11/2021 1538 Last data filed at 12/11/2021 0517 Gross per 24 hour  Intake --  Output 1500 ml  Net -1500 ml     12/10 1901 - 12/12 0700 In: -  Out: 1925 [id number]  Filed Weights   12/07/21 0500 12/08/21 0500 12/10/21 0603  Weight: 69.3 kg 69.1 kg 68.5 kg    Data Reviewed: Basic Metabolic Panel: Recent Labs  Lab 12/05/21 1524 12/06/21 0348 12/07/21 0145 12/08/21 0200 12/11/21 0105  NA 138 137 135 138 139  K 4.1 3.6 3.8 3.8 4.1  CL 109 108 110 109 109  CO2 21* 23 21* 22 23  GLUCOSE 56* 110* 76 83 101*  BUN 20 20 19 16 22   CREATININE 1.11 1.17 1.02 0.98 1.05  CALCIUM 9.3 8.6* 8.4* 8.4* 9.0   Liver Function  Tests: Recent Labs  Lab 12/05/21 1524 12/06/21 0348 12/07/21 0145 12/08/21 0200 12/11/21 0105  AST 37 32 31 33 35  ALT 23 17 18 19 19   ALKPHOS 148* 122 114 106 131*  BILITOT 3.4* 3.2* 2.6* 2.5* 2.3*  PROT 6.8 5.9* 5.4* 5.6* 5.6*  ALBUMIN 2.9* 2.5* 2.3* 2.4* 2.3*   No results for input(s): LIPASE, AMYLASE in the last 168 hours. Recent Labs  Lab 12/05/21 1524 12/11/21 0105  AMMONIA 113* 73*   CBC: Recent Labs  Lab 12/05/21 1524 12/06/21 0348 12/07/21 0145 12/08/21 0200 12/11/21 0105  WBC 4.5 3.2* 3.0* 3.0* 3.4*  NEUTROABS 2.7  --   --   --   --   HGB 10.8* 9.5* 8.8* 9.2* 9.2*  HCT 33.3* 28.8* 26.1* 27.3* 27.7*  MCV 101.2* 101.1* 98.9 98.9 100.4*  PLT 54* 43* 38* 39* 41*    Cardiac Enzymes: No results for input(s): CKTOTAL, CKMB, CKMBINDEX, TROPONINI in the last 168 hours. BNP (last 3 results) Recent Labs    07/23/21 1328  BNP 278.9*    ProBNP (last 3 results) No results for input(s): PROBNP in the last 8760 hours.  CBG: Recent Labs  Lab 12/08/21 2126 12/09/21 0756 12/09/21 1203 12/09/21 1605 12/09/21 2204  GLUCAP 146* 87 134* 109* 137*       Radiology Reports  DG Hand 2 View Left  Result Date: 12/10/2021 CLINICAL DATA:  Pain EXAM: LEFT HAND - 2 VIEW COMPARISON:  None. FINDINGS: No fracture or dislocation is seen. The joint spaces are preserved. The visualized soft tissues are unremarkable. IMPRESSION: Negative. Electronically Signed   By: 14/10/2021 M.D.   On: 12/10/2021 00:42       Antibiotics: Anti-infectives (From admission, onward)    Start     Dose/Rate Route Frequency Ordered Stop   12/06/21 1500  cefTRIAXone (ROCEPHIN) 1 g in sodium chloride 0.9 % 100 mL IVPB  Status:  Discontinued        1 g 200 mL/hr over 30 Minutes Intravenous Every 24 hours 12/06/21 1417 12/09/21 1228         DVT prophylaxis: SCDs  Code Status: Full code  Family Communication: No family at bedside   Consultants:   Procedures:     Objective    Physical Examination:   General-appears in no acute distress Heart-S1-S2, regular, no murmur auscultated Lungs-clear to auscultation bilaterally, no wheezing or crackles auscultated Abdomen-soft, nontender, no organomegaly Extremities-no edema in the lower extremities Neuro-alert, oriented x3, no focal deficit noted  Status is: Inpatient  Dispo: The patient is from: Skilled nursing facility              Anticipated d/c is to: Skilled nursing facility              Anticipated d/c date is: 12/12/2021              Patient currently stable for discharge  Barrier to discharge-awaiting bed at skilled nursing facility  COVID-19 Labs  No results for input(s): DDIMER, FERRITIN,  LDH, CRP in the last 72 hours.  Lab Results  Component Value Date   SARSCOV2NAA POSITIVE (A) 09/21/2021   SARSCOV2NAA POSITIVE (A) 08/01/2021   SARSCOV2NAA POSITIVE (A) 07/23/2021            Recent Results (from the past 240 hour(s))  Urine Culture     Status: Abnormal   Collection Time: 12/06/21  2:50 PM   Specimen: Urine, Clean Catch  Result Value Ref Range Status  Specimen Description URINE, CLEAN CATCH  Final   Special Requests   Final    NONE Performed at [facility], [place] [address], [place], [place] [address]    Culture MULTIPLE SPECIES PRESENT, SUGGEST RECOLLECTION (A)  Final   Report Status 12/07/2021 FINAL  Final    Oswald Hillock   [facility] If 7PM-7AM, please contact night-coverage at [email or web address], Office  [phone]   12/11/2021, 3:38 PM  LOS: 0 days

## 2021-12-11 NOTE — Progress Notes (Signed)
Physical Therapy Treatment Patient Details Name: Johnny Dunlap MRN: [id number] DOB: Apr 30, 1950 Today's Date: 12/11/2021   History of Present Illness Pt is a 71 y/o male admitted 12/6 secondary to dizziness and weakness. PMH includes cirrhosis, alcohol use, GI bleed, anemia, thrombocytopenia, COPD.    PT Comments    Pt was seen for mobility with instruction for controlling LOB on the RW, with timing, pacing and step length control.  Pt tends to move impulsively and leads to posterior LOB, L lateral LOB and sits with poor control of descent.  Will recommend SNF care due to his poor retention of corrections of balance, LE weakness including L ankle mild foot drop and possibly L side neglect.  Follow for acute PT goals as are outlined.  Recommendations for follow up therapy are one component of a multi-disciplinary discharge planning process, led by the attending physician.  Recommendations may be updated based on patient status, additional functional criteria and insurance authorization.  Follow Up Recommendations  Skilled nursing-short term rehab (<3 hours/day)     Assistance Recommended at Discharge Frequent or constant Supervision/Assistance  Equipment Recommendations  None recommended by PT    Recommendations for Other Services       Precautions / Restrictions Precautions Precautions: Fall Restrictions Weight Bearing Restrictions: No     Mobility  Bed Mobility Overal bed mobility: Needs Assistance Bed Mobility: Supine to Sit;Sit to Supine     Supine to sit: Min assist Sit to supine: Min guard        Transfers Overall transfer level: Needs assistance Equipment used: Rolling walker (2 wheels) Transfers: Sit to/from Stand Sit to Stand: Min assist           General transfer comment: reminders for hand placement and settng up with walker    Ambulation/Gait Ambulation/Gait assistance: Min assist Gait Distance (Feet): 70 Feet (35 x 2) Assistive device: Rolling  walker (2 wheels) Gait Pattern/deviations: Step-through pattern Gait velocity: Decreased   Pre-gait activities: cues for standing balance and insruction on controlling walker General Gait Details: lateral and posterior LOB   Stairs             Wheelchair Mobility    Modified Rankin (Stroke Patients Only)       Balance Overall balance assessment: Needs assistance Sitting-balance support: Feet supported Sitting balance-Leahy Scale: Fair       Standing balance-Leahy Scale: Poor                              Cognition Arousal/Alertness: Awake/alert Behavior During Therapy: Impulsive Overall Cognitive Status: No family/caregiver present to determine baseline cognitive functioning                                 General Comments: cued sequence and safety, using RW with dense reminders for controlling speed of movement and safety of turns and setting up to sit        Exercises General Exercises - Lower Extremity Ankle Circles/Pumps: AROM;5 reps Quad Sets: AROM;10 reps Heel Slides: AROM;10 reps Hip ABduction/ADduction: AROM;10 reps    General Comments General comments (skin integrity, edema, etc.): Pt was seen for mobility on RW with densely cued sequence and safety education to maintain the distance from walker and slow pace to manage LE weakness      Pertinent Vitals/Pain Pain Assessment: No/denies pain    Home Living  Prior Function            PT Goals (current goals can now be found in the care plan section) Acute Rehab PT Goals Patient Stated Goal: to sleep Progress towards PT goals: Progressing toward goals    Frequency    Min 2X/week      PT Plan Current plan remains appropriate    Co-evaluation              AM-PAC PT "6 Clicks" Mobility   Outcome Measure  Help needed turning from your back to your side while in a flat bed without using bedrails?: A Little Help needed  moving from lying on your back to sitting on the side of a flat bed without using bedrails?: A Little Help needed moving to and from a bed to a chair (including a wheelchair)?: A Little Help needed standing up from a chair using your arms (e.g., wheelchair or bedside chair)?: A Lot Help needed to walk in hospital room?: A Lot Help needed climbing 3-5 steps with a railing? : A Lot 6 Click Score: 15    End of Session Equipment Utilized During Treatment: Gait belt Activity Tolerance: Patient tolerated treatment well Patient left: in bed;with call bell/phone within reach Nurse Communication: Mobility status PT Visit Diagnosis: Unsteadiness on feet (R26.81);Muscle weakness (generalized) (M62.81);Difficulty in walking, not elsewhere classified (R26.2)     Time: 7253-6644 PT Time Calculation (min) (ACUTE ONLY): 33 min  Charges:  $Gait Training: 8-22 mins $Therapeutic Exercise: 8-22 mins         Ivar Drape 12/11/2021, 7:49 PM  Samul Dada, PT PhD Acute Rehab Dept. Number: [facility] [id number] and [facility] [phone]

## 2021-12-12 DIAGNOSIS — R531 Weakness: Secondary | ICD-10-CM | POA: Diagnosis not present

## 2021-12-12 LAB — PROTIME-INR
INR: 1.7 — ABNORMAL HIGH (ref 0.8–1.2)
Prothrombin Time: 20.2 s — ABNORMAL HIGH (ref 11.4–15.2)

## 2021-12-12 LAB — GLUCOSE, CAPILLARY
Glucose-Capillary: 88 mg/dL (ref 70–99)
Glucose-Capillary: 170 mg/dL — ABNORMAL HIGH (ref 70–99)
Glucose-Capillary: 144 mg/dL — ABNORMAL HIGH (ref 70–99)
Glucose-Capillary: 158 mg/dL — ABNORMAL HIGH (ref 70–99)

## 2021-12-12 MED ORDER — COSYNTROPIN 0.25 MG IJ SOLR
0.2500 mg | Freq: Once | INTRAMUSCULAR | Status: AC
  Administered 2021-12-13: 06:00:00 0.25 mg via INTRAVENOUS
  Filled 2021-12-12: qty 0.25

## 2021-12-12 NOTE — TOC Progression Note (Signed)
Transition of Care [facility]) - Progression Note    Patient Details  Name: Johnny Dunlap MRN: [id number] Date of Birth: 09/08/1950  [facility]) CM/SW Contact  Mearl Latin, LCSW Phone Number: 12/12/2021, 8:33 AM  Clinical Narrative:    [facility] re[facility] still awaiting insurance authorization.    Expected Discharge Plan: Skilled Nursing Facility Barriers to Discharge: No Barriers Identified  Expected Discharge Plan and Services Expected Discharge Plan: Skilled Nursing Facility In-house Referral: Clinical Social Work   Post Acute Care Choice: Skilled Nursing Facility Living arrangements for the past 2 months: Skilled Nursing Facility                                       Social Determinants of Health (SDOH) Interventions    Readmission Risk Interventions No flowsheet data found.

## 2021-12-12 NOTE — Progress Notes (Signed)
PROGRESS NOTE  Johnny Dunlap WNI:[id number] DOB: [birth year] DOA: 12/05/2021 PCP: [facility]  Brief History   71 year old male with medical history of cirrhosis, alcohol use, GI bleed, anemia, thrombocytopenia, COPD, generalized weakness presented with ongoing progressive weakness and dizziness. Patient said that he had have several weeks of progressive weakness and dizziness.  Feels lightheaded at times.  Also having difficulty with walking.  CT head showed no acute abnormality.  The patient has been evaluated by PT/OT. Their recommendation is for SNF - short term rehab.  Cosyntropin stim test ordered for the am. Consultants  None  Procedures  None  Antibiotics   Anti-infectives (From admission, onward)    Start     Dose/Rate Route Frequency Ordered Stop   12/06/21 1500  cefTRIAXone (ROCEPHIN) 1 g in sodium chloride 0.9 % 100 mL IVPB  Status:  Discontinued        1 g 200 mL/hr over 30 Minutes Intravenous Every 24 hours 12/06/21 1417 12/09/21 1228      Subjective  The patient is resting comfortably. No acute distress.  Objective   Vitals:  Vitals:   12/12/21 1200 12/12/21 1614  BP: (!) 89/47 (!) 93/52  Pulse:  66  Resp: 18 18  Temp: 98.4 F (36.9 C) 98.7 F (37.1 C)  SpO2:      Exam:  Constitutional:  The patient is awake, alert, and oriented x 3. No acute distress. Respiratory:  No increased work of breathing. No wheezes, rales, or rhonchi No tactile fremitus Cardiovascular:  Regular rate and rhythm No murmurs, ectopy, or gallups. No lateral PMI. No thrills. Abdomen:  Abdomen is soft, non-tender, non-distended No hernias, masses, or organomegaly Normoactive bowel sounds.  Musculoskeletal:  No cyanosis, clubbing, or edema Skin:  No rashes, lesions, ulcers palpation of skin: no induration or nodules Neurologic:  CN 2-12 intact Sensation all 4 extremities intact Psychiatric:  Mental status Mood, affect appropriate Orientation to  person, place, time  judgment and insight appear intact  I have personally reviewed the following:   Today's Data  Vitals  Lab Data  INR 1.7  Micro Data  Polymicrobial urine culture  Imaging  X-ray of hand CT head  Cardiology Data  EKG  Scheduled Meds:  feeding supplement  237 mL Oral BID BM   folic acid  1 mg Oral Daily   gabapentin  400 mg Oral TID   lactulose  30 g Oral TID   midodrine  5 mg Oral TID WC   multivitamin with minerals  1 tablet Oral Daily   sodium chloride flush  3 mL Intravenous Q12H   tamsulosin  0.4 mg Oral Daily   thiamine  100 mg Oral Daily   Continuous Infusions:  Principal Problem:   Weakness Active Problems:   Other cirrhosis of liver (HCC)   Thrombocytopenia (HCC)   Iron deficiency anemia   Protein-calorie malnutrition, severe   COPD (chronic obstructive pulmonary disease) (HCC)   Generalized weakness   LOS: 0 days   A & P  Weakness/dizziness -Improved -Unclear etiology, patient found to have elevated ammonia level; started on lactulose 30 g 3 times daily -Also having episodes of hypoglycemia; improved with nutrition -UA in the ED was abnormal, urine culture obtained -Urine culture grew multiple species -Patient was empirically started on ceftriaxone, ceftriaxone has been discontinued -PT consulted; plan to go to skilled nursing facility  Adrenal insufficiency: Potential cause of hypotension resulting in weakness and dizziness as well as hypoglycemia. Checking Cosyntropin stim test in the  am. The patient remains hypotensive despite midodrine 5 mg tid. May require fludrocortisone.   Liver cirrhosis -Patient has known history of liver cirrhosis due to alcohol abuse -Also has thrombo-cytopenia at 54,000 due to liver cirrhosis -Labs are stable with normal ALT and AST -Continue thiamine and folate -Continue lactulose; ammonia was elevated at 113 on 12/05/2021.  We will recheck ammonia level in a.m. -Continue midodrine for  hypotension -Lasix currently on hold   Anemia of chronic disease -Hemoglobin dropped 8.8 likely from dilution - hemoglobin is 9.2, stable -Diuretics on hold due to hypotension -Follow CBC in a.m.   Left hand pain -Resolved -X-ray of the left hand was unremarkable  I have seen and examined this patient myself. I have spent 35 minutes in his evaluation and care.  DVT prophylaxis: SCD's Code Status: Full Code Family Communication: None available Disposition Plan: SNF    Dorathy Stallone, DO [facility] Direct contact: see [email or web address]  7PM-7AM contact night coverage as above 12/12/2021, 6:01 PM  LOS: 0 days

## 2021-12-13 DIAGNOSIS — R531 Weakness: Secondary | ICD-10-CM | POA: Diagnosis not present

## 2021-12-13 LAB — GLUCOSE, CAPILLARY
Glucose-Capillary: 88 mg/dL (ref 70–99)
Glucose-Capillary: 264 mg/dL — ABNORMAL HIGH (ref 70–99)
Glucose-Capillary: 210 mg/dL — ABNORMAL HIGH (ref 70–99)
Glucose-Capillary: 151 mg/dL — ABNORMAL HIGH (ref 70–99)

## 2021-12-13 LAB — BASIC METABOLIC PANEL
Anion gap: 5 (ref 5–15)
BUN: 21 mg/dL (ref 8–23)
CO2: 23 mmol/L (ref 22–32)
Calcium: 8.4 mg/dL — ABNORMAL LOW (ref 8.9–10.3)
Chloride: 109 mmol/L (ref 98–111)
Creatinine, Ser: 0.95 mg/dL (ref 0.61–1.24)
GFR, Estimated: >60 mL/min (ref >60)
Glucose, Bld: 89 mg/dL (ref 70–99)
Potassium: 3.6 mmol/L (ref 3.5–5.1)
Sodium: 137 mmol/L (ref 135–145)

## 2021-12-13 LAB — CBC WITH DIFFERENTIAL/PLATELET
Abs Immature Granulocytes: 0.01 10*3/uL (ref 0.00–0.07)
Basophils Absolute: 0 10*3/uL (ref 0.0–0.1)
Basophils Relative: 1 %
Eosinophils Absolute: 0.3 10*3/uL (ref 0.0–0.5)
Eosinophils Relative: 9 %
HCT: 27.3 % — ABNORMAL LOW (ref 39.0–52.0)
Hemoglobin: 9.4 g/dL — ABNORMAL LOW (ref 13.0–17.0)
Immature Granulocytes: 0 %
Lymphocytes Relative: 23 %
Lymphs Abs: 0.7 10*3/uL (ref 0.7–4.0)
MCH: 33.9 pg (ref 26.0–34.0)
MCHC: 34.4 g/dL (ref 30.0–36.0)
MCV: 98.6 fL (ref 80.0–100.0)
Monocytes Absolute: 0.4 10*3/uL (ref 0.1–1.0)
Monocytes Relative: 13 %
Neutro Abs: 1.7 10*3/uL (ref 1.7–7.7)
Neutrophils Relative %: 54 %
Platelets: 40 10*3/uL — ABNORMAL LOW (ref 150–400)
RBC: 2.77 MIL/uL — ABNORMAL LOW (ref 4.22–5.81)
RDW: 15.4 % (ref 11.5–15.5)
WBC: 3.1 10*3/uL — ABNORMAL LOW (ref 4.0–10.5)
nRBC: 0 % (ref 0.0–0.2)

## 2021-12-13 LAB — ACTH STIMULATION, 3 TIME POINTS
Cortisol, 30 Min: 12.6 ug/dL
Cortisol, 60 Min: 15.5 ug/dL
Cortisol, Base: 4.7 ug/dL

## 2021-12-13 LAB — AMMONIA: Ammonia: 87 umol/L — ABNORMAL HIGH (ref 9–35)

## 2021-12-13 MED ORDER — FLUDROCORTISONE ACETATE 0.1 MG PO TABS
0.1000 mg | ORAL_TABLET | Freq: Every day | ORAL | Status: DC
  Administered 2021-12-13 – 2021-12-14 (×2): 0.1 mg via ORAL
  Filled 2021-12-13 (×2): qty 1

## 2021-12-13 MED ORDER — FLUDROCORTISONE ACETATE 0.1 MG PO TABS: 0.1000 mg | ORAL_TABLET | Freq: Every day | ORAL | 0 refills | Status: AC

## 2021-12-13 NOTE — TOC Progression Note (Addendum)
Transition of Care [facility]) - Progression Note    Patient Details  Name: Johnny Dunlap MRN: [id number] Date of Birth: July 31, 1950  Transition of [facility]) CM/SW Contact  Mearl Latin, LCSW Phone Number: 12/13/2021, 8:33 AM  Clinical Narrative:    8:33am-Per Lewayne Bunting, insurance approval still pending. Discussed with [facility] leadership yesterday and Supervisor sent an email to [facility].   11:30am- Lewayne Bunting has insurance approval for patient to discharge today.    Expected Discharge Plan: Skilled Nursing Facility Barriers to Discharge: No Barriers Identified  Expected Discharge Plan and Services Expected Discharge Plan: Skilled Nursing Facility In-house Referral: Clinical Social Work   Post Acute Care Choice: Skilled Nursing Facility Living arrangements for the past 2 months: Skilled Nursing Facility                                       Social Determinants of Health (SDOH) Interventions    Readmission Risk Interventions No flowsheet data found.

## 2021-12-13 NOTE — Progress Notes (Signed)
Pt prepared for d/c to SNF. Skin intact except as charted in most recent assessments. Vitals are stable. Report called to receiving facility. Pt to be transported by ambulance service. 

## 2021-12-13 NOTE — Discharge Summary (Signed)
Physician Discharge Summary  Johnny Dunlap EGB:[id number] DOB: 10-24-1950 DOA: 12/05/2021  PCP: [facility]  Admit date: 12/05/2021 Discharge date: 12/13/2021  Recommendations for Outpatient Follow-up:  Discharge to SNF for rehab.  Chemistry to be drawn at SNF on 12/20/2021 and reported to facility physician. Follow up with PCP at discharge from facility. Pt should have chemistry drawn at that visit to be reported to PCP.  Contact information for after-discharge care     Destination     [facility] .   Service: Skilled Nursing Contact information: [address] [place] [phone]                      Discharge Diagnoses: Principal diagnosis is #1 Weakness/dizziness due to adrenal insufficiency Due to adrenal insufficiency.Adrenal insufficiency:  Cosyntropin stim test was positive for adrenal insufficiency. Fludrocortisone started.  Liver cirrhosis: Patient has known history of liver cirrhosis due to alcohol abuse 3.   Thrombocytopenia at 54,000 due to liver cirrhosis 4.    Anemia of chronic disease. Hgb stable at 9.4 5.    Left hand pain: Resolved. Negative x-ray. 6.    Debility: Pt to go to SNF for rehab.  Discharge Condition: Fair Disposition: SNF  Diet recommendation: 2 gm Na.  Filed Weights   12/10/21 0603 12/12/21 0500 12/13/21 0500  Weight: 68.5 kg 68.7 kg 68.7 kg    History of present illness: Johnny Dunlap is a 71 y.o. male with medical history significant of cirrhosis, alcohol use, GI bleed, anemia, thrombocytopenia, COPD, generalized weakness presenting with ongoing progressive weakness and dizziness.  Patient states that he has had several weeks of progressive weakness and dizziness feels lightheaded at times and is difficult for him to walk.  Does have history of cirrhosis with history of hepatic encephalopathy.  Was seen in the ED a month ago with similar symptoms with ammonia  elevated at that time sent back to facility. He states he has been feeling "shaky" as well. Denies current alcohol use. P.o. intake decreased recently.     Denies fevers, chills, chest pain, shortness of breath, abdominal pain, constipation, diarrhea, nausea, vomiting.    ED Course: Vital signs in the ED significant for blood pressure in the 546E to 703J systolic, heart rate in the 50s to 60s.  Lab work-up showed CMP with bicarb 21, glucose 56, albumin 2.9, alk phos stable at 148, T bili stable at 3.4.  CBC showed hemoglobin stable at 10.8 and platelets stable at 54.  Ammonia level elevated to 113 urinalysis pending.  CT head showed no acute normality.  Patient received dose of lactulose in the ED.  Hospital Course:  70 year old male with medical history of cirrhosis, alcohol use, GI bleed, anemia, thrombocytopenia, COPD, generalized weakness presented with ongoing progressive weakness and dizziness. Patient said that he had have several weeks of progressive weakness and dizziness.  Feels lightheaded at times.  Also having difficulty with walking.  CT head showed no acute abnormality.   The patient has been evaluated by PT/OT. Their recommendation is for SNF - short term rehab.   Cosyntropin stim test was positive for adrenal insufficiency. He has been started on fludrocortisone as this is the putative cause of his hypotension, dizziness, weakness, and hypoglycemia.  Today's assessment: S: The patient is resting comfortably. No new complaints. O: Vitals:  Vitals:   12/13/21 0730 12/13/21 1158  BP: (!) 85/54 (!) 103/57  Pulse: 62 68  Resp: 17 18  Temp: 98.4 F (36.9 C) 98.2 F (36.8 C)  SpO2: 96% 96%    Exam:   Constitutional:  The patient is awake, alert, and oriented x 3. No acute distress. Respiratory:  No increased work of breathing. No wheezes, rales, or rhonchi No tactile fremitus Cardiovascular:  Regular rate and rhythm No murmurs, ectopy, or gallups. No lateral PMI. No  thrills. Abdomen:  Abdomen is soft, non-tender, non-distended No hernias, masses, or organomegaly Normoactive bowel sounds.  Musculoskeletal:  No cyanosis, clubbing, or edema Skin:  No rashes, lesions, ulcers palpation of skin: no induration or nodules Neurologic:  CN 2-12 intact Sensation all 4 extremities intact Psychiatric:  Mental status Mood, affect appropriate Orientation to person, place, time  judgment and insight appear intact  Discharge Instructions  Discharge Instructions     Activity as tolerated - No restrictions   Complete by: As directed    Call MD for:  persistant nausea and vomiting   Complete by: As directed    Call MD for:  severe uncontrolled pain   Complete by: As directed    Diet - low sodium heart healthy   Complete by: As directed    Discharge instructions   Complete by: As directed    Discharge to SNF for rehab.  Chemistry to be drawn at [facility] and reported to facility physician on 12/20/2021. Follow up with PCP upon discharge from rehab. Chemistry should be checked upon this visit and reported to PCP.   Increase activity slowly   Complete by: As directed       Allergies as of 12/13/2021   No Known Allergies      Medication List     STOP taking these medications    diclofenac Sodium 1 % Gel Commonly known as: VOLTAREN       TAKE these medications    feeding supplement Liqd Take 237 mLs by mouth 2 (two) times daily between meals.   fludrocortisone 0.1 MG tablet Commonly known as: FLORINEF Take 1 tablet (0.1 mg total) by mouth daily. Start taking on: December 14, 5426   folic acid 1 MG tablet Commonly known as: FOLVITE Take 1 tablet (1 mg total) by mouth daily.   furosemide 40 MG tablet Commonly known as: LASIX Take 1 tablet (40 mg total) by mouth daily as needed for fluid or edema.   gabapentin 400 MG capsule Commonly known as: NEURONTIN Take 400 mg by mouth 3 (three) times daily.   lactulose 10 GM/15ML  solution Commonly known as: CHRONULAC Take 45 mLs (30 g total) by mouth 3 (three) times daily.   midodrine 5 MG tablet Commonly known as: PROAMATINE Take 5 mg by mouth 3 (three) times daily with meals. Hold if SBP >130   multivitamin with minerals Tabs tablet Take 1 tablet by mouth daily.   tamsulosin 0.4 MG Caps capsule Commonly known as: FLOMAX Take 1 capsule (0.4 mg total) by mouth daily.   thiamine 100 MG tablet Take 1 tablet (100 mg total) by mouth daily.   Vitamin D (Ergocalciferol) 1.25 MG (50000 UNIT) Caps capsule Commonly known as: DRISDOL Take 50,000 Units by mouth every 7 (seven) days. Mondays       No Known Allergies  The results of significant diagnostics from this hospitalization (including imaging, microbiology, ancillary and laboratory) are listed below for reference.    Significant Diagnostic Studies: CT Head Wo Contrast  Result Date: 12/05/2021 CLINICAL DATA:  Dizziness EXAM: CT HEAD WITHOUT CONTRAST TECHNIQUE: Contiguous axial images were obtained from the  base of the skull through the vertex without intravenous contrast. COMPARISON:  11/01/2021 FINDINGS: Brain: No evidence of acute infarction, hemorrhage, hydrocephalus, extra-axial collection or mass lesion/mass effect. Extensive low-density changes within the periventricular and subcortical white matter compatible with chronic microvascular ischemic change. Mild diffuse cerebral volume loss. Vascular: Atherosclerotic calcifications involving the large vessels of the skull base. No unexpected hyperdense vessel. Skull: Normal. Negative for fracture or focal lesion. Sinuses/Orbits: Similar mucosal thickening within the bilateral ethmoid air cells. No acute findings. Other: None. IMPRESSION: 1. No acute intracranial findings. 2. Stable advanced chronic microvascular ischemic change. Electronically Signed   By: Davina Poke D.O.   On: 12/05/2021 16:12   DG Hand 2 View Left  Result Date: 12/10/2021 CLINICAL  DATA:  Pain EXAM: LEFT HAND - 2 VIEW COMPARISON:  None. FINDINGS: No fracture or dislocation is seen. The joint spaces are preserved. The visualized soft tissues are unremarkable. IMPRESSION: Negative. Electronically Signed   By: Julian Hy M.D.   On: 12/10/2021 00:42   DG Swallowing Func-Speech Pathology  Result Date: 12/08/2021 Table formatting from the original result was not included. Objective Swallowing Evaluation: Type of Study: MBS-Modified Barium Swallow Study  Patient Details Name: Johnny Dunlap MRN: [id number] Date of Birth: [birth year] Today's Date: 12/08/2021 Time: SLP Start Time (ACUTE ONLY): 0845 -SLP Stop Time (ACUTE ONLY): 0903 SLP Time Calculation (min) (ACUTE ONLY): 18 min Past Medical History: Past Medical History: Diagnosis Date  Cirrhosis ([place])   COPD (chronic obstructive pulmonary disease) ([place])  Past Surgical History: Past Surgical History: Procedure Laterality Date  MOUTH SURGERY    NO PAST SURGERIES   HPI: Pt is a 71 y.o. male who presented with ongoing progressive weakness and dizziness. CT head negative. SLp consulted due to pt coughing while drinking liquid in the ED. PMH: cirrhosis, alcohol use, GI bleed, anemia, thrombocytopenia, COPD, generalized weakness, hepatic encephalopathy.  No data recorded  Recommendations for follow up therapy are one component of a multi-disciplinary discharge planning process, led by the attending physician.  Recommendations may be updated based on patient status, additional functional criteria and insurance authorization. Assessment / Plan / Recommendation Clinical Impressions 12/08/2021 Clinical Impression Pt presents with orohparyngeal dysphagia characterized by impaired bolus cohesion, reduced pharyngeal stripping, and reduced tongue base retraction. He demonstrated premature spillage, vallecular residue, pyriform sinus residue, posterior pharyngeal wall residue, and penetration (PAS 2,3) with consecutive swallows of thin liquids. Prompted  coughing was ineffective in expelling penetrate. Residue increased with bolus size and advancement of consistency, but was improved with a liquid wash. Pt's current diet of regular texture solids and thin liquids will be continued, and SLP will follow briefly for treatment. SLP Visit Diagnosis Dysphagia, unspecified (R13.10) Attention and concentration deficit following -- Frontal lobe and executive function deficit following -- Impact on safety and function Mild aspiration risk   Treatment Recommendations 12/08/2021 Treatment Recommendations Therapy as outlined in treatment plan below   Prognosis 12/08/2021 Prognosis for Safe Diet Advancement Good Barriers to Reach Goals Cognitive deficits Barriers/Prognosis Comment -- Diet Recommendations 12/08/2021 SLP Diet Recommendations Regular solids;Thin liquid Liquid Administration via Cup;Straw Medication Administration Whole meds with liquid Compensations Slow rate;Small sips/bites;Follow solids with liquid Postural Changes Seated upright at 90 degrees   Other Recommendations 12/08/2021 Recommended Consults -- Oral Care Recommendations Oral care BID Other Recommendations -- Follow Up Recommendations No SLP follow up Assistance recommended at discharge -- Functional Status Assessment Patient has not had a recent decline in their functional status Frequency and Duration  12/08/2021 Speech Therapy Frequency (  ACUTE ONLY) min 1 x/week Treatment Duration 1 week   Oral Phase 12/08/2021 Oral Phase Impaired Oral - Pudding Teaspoon -- Oral - Pudding Cup -- Oral - Honey Teaspoon -- Oral - Honey Cup -- Oral - Nectar Teaspoon -- Oral - Nectar Cup -- Oral - Nectar Straw Decreased bolus cohesion;Premature spillage Oral - Thin Teaspoon -- Oral - Thin Cup Decreased bolus cohesion;Premature spillage Oral - Thin Straw Decreased bolus cohesion;Premature spillage Oral - Puree WFL Oral - Mech Soft -- Oral - Regular WFL Oral - Multi-Consistency -- Oral - Pill WFL Oral Phase - Comment --  Pharyngeal  Phase 12/08/2021 Pharyngeal Phase Impaired Pharyngeal- Pudding Teaspoon -- Pharyngeal -- Pharyngeal- Pudding Cup -- Pharyngeal -- Pharyngeal- Honey Teaspoon -- Pharyngeal -- Pharyngeal- Honey Cup -- Pharyngeal -- Pharyngeal- Nectar Teaspoon -- Pharyngeal -- Pharyngeal- Nectar Cup -- Pharyngeal -- Pharyngeal- Nectar Straw Reduced pharyngeal peristalsis Pharyngeal -- Pharyngeal- Thin Teaspoon -- Pharyngeal -- Pharyngeal- Thin Cup Delayed swallow initiation-vallecula;Reduced tongue base retraction;Reduced pharyngeal peristalsis;Pharyngeal residue - valleculae;Pharyngeal residue - pyriform Pharyngeal -- Pharyngeal- Thin Straw Delayed swallow initiation-vallecula;Reduced tongue base retraction;Reduced pharyngeal peristalsis;Pharyngeal residue - valleculae;Pharyngeal residue - pyriform;Delayed swallow initiation-pyriform sinuses;Penetration/Aspiration during swallow Pharyngeal Material enters airway, remains ABOVE vocal cords then ejected out;Material enters airway, remains ABOVE vocal cords and not ejected out Pharyngeal- Puree Delayed swallow initiation-vallecula;Reduced tongue base retraction;Reduced pharyngeal peristalsis;Pharyngeal residue - valleculae;Pharyngeal residue - pyriform Pharyngeal -- Pharyngeal- Mechanical Soft -- Pharyngeal -- Pharyngeal- Regular Delayed swallow initiation-vallecula;Reduced tongue base retraction;Reduced pharyngeal peristalsis;Pharyngeal residue - valleculae;Pharyngeal residue - pyriform Pharyngeal -- Pharyngeal- Multi-consistency -- Pharyngeal -- Pharyngeal- Pill Delayed swallow initiation-vallecula;Reduced tongue base retraction;Reduced pharyngeal peristalsis;Pharyngeal residue - valleculae;Pharyngeal residue - pyriform Pharyngeal -- Pharyngeal Comment --  Cervical Esophageal Phase  12/08/2021 Cervical Esophageal Phase WFL Pudding Teaspoon -- Pudding Cup -- Honey Teaspoon -- Honey Cup -- Nectar Teaspoon -- Nectar Cup -- Nectar Straw -- Thin Teaspoon -- Thin Cup -- Thin Straw -- Puree --  Mechanical Soft -- Regular -- Multi-consistency -- Pill -- Cervical Esophageal Comment -- Shanika I. Hardin Negus, Yaak, [place] Office number [phone] Pager [place] 12/08/2021, 9:45 AM                      Microbiology: Recent Results (from the past 240 hour(s))  Urine Culture     Status: Abnormal   Collection Time: 12/06/21  2:50 PM   Specimen: Urine, Clean Catch  Result Value Ref Range Status   Specimen Description URINE, CLEAN CATCH  Final   Special Requests   Final    NONE Performed at [facility], [address] [place], [place] [address]    Culture MULTIPLE SPECIES PRESENT, SUGGEST RECOLLECTION (A)  Final   Report Status 12/07/2021 FINAL  Final     Labs: Basic Metabolic Panel: Recent Labs  Lab 12/07/21 0145 12/08/21 0200 12/11/21 0105 12/13/21 0615  NA 135 138 139 137  K 3.8 3.8 4.1 3.6  CL 110 109 109 109  CO2 21* '22 23 23  ' GLUCOSE 76 83 101* 89  BUN '19 16 22 21  ' CREATININE 1.02 0.98 1.05 0.95  CALCIUM 8.4* 8.4* 9.0 8.4*   Liver Function Tests: Recent Labs  Lab 12/07/21 0145 12/08/21 0200 12/11/21 0105  AST 31 33 35  ALT '18 19 19  ' ALKPHOS 114 106 131*  BILITOT 2.6* 2.5* 2.3*  PROT 5.4* 5.6* 5.6*  ALBUMIN 2.3* 2.4* 2.3*   No results for input(s): LIPASE, AMYLASE in the last  168 hours. Recent Labs  Lab 12/11/21 0105 12/13/21 0853  AMMONIA 73* 87*   CBC: Recent Labs  Lab 12/07/21 0145 12/08/21 0200 12/11/21 0105 12/13/21 0615  WBC 3.0* 3.0* 3.4* 3.1*  NEUTROABS  --   --   --  1.7  HGB 8.8* 9.2* 9.2* 9.4*  HCT 26.1* 27.3* 27.7* 27.3*  MCV 98.9 98.9 100.4* 98.6  PLT 38* 39* 41* 40*   Cardiac Enzymes: No results for input(s): CKTOTAL, CKMB, CKMBINDEX, TROPONINI in the last 168 hours. BNP: BNP (last 3 results) Recent Labs    07/23/21 1328  BNP 278.9*    ProBNP (last 3 results) No results for input(s): PROBNP in the last 8760 hours.  CBG: Recent Labs  Lab 12/12/21 1201  12/12/21 1616 12/12/21 2053 12/13/21 0733 12/13/21 1202  GLUCAP 170* 144* 158* 88 264*    Principal Problem:   Weakness Active Problems:   Other cirrhosis of liver (HCC)   Thrombocytopenia (HCC)   Iron deficiency anemia   Protein-calorie malnutrition, severe   COPD (chronic obstructive pulmonary disease) (HCC)   Generalized weakness   Time coordinating discharge: 38 minutes  Signed:        Louie Flenner, DO [facility]  12/13/2021, 2:21 PM

## 2021-12-13 NOTE — Progress Notes (Signed)
Occupational Therapy Treatment Patient Details Name: Johnny Dunlap MRN: [id number] DOB: August 21, 1950 Today's Date: 12/13/2021   History of present illness Pt is a 71 y/o male admitted 12/6 secondary to dizziness and weakness. PMH includes cirrhosis, alcohol use, GI bleed, anemia, thrombocytopenia, COPD.   OT comments  Patient seen by skilled OT to address bed mobility, bathing, UB dressing, and transfers. Patient requires cues for pacing dur to impulsiveness. Patient making gains with mobility and transfers requiring min assist. Patient performed bathing seated at sink and standing for peri area cleaning. Patient asked to perform mobility in hallway and returned to bed. Acute OT to continue to follow.    Recommendations for follow up therapy are one component of a multi-disciplinary discharge planning process, led by the attending physician.  Recommendations may be updated based on patient status, additional functional criteria and insurance authorization.    Follow Up Recommendations  Skilled nursing-short term rehab (<3 hours/day)    Assistance Recommended at Discharge Frequent or constant Supervision/Assistance  Equipment Recommendations       Recommendations for Other Services      Precautions / Restrictions Precautions Precautions: Fall       Mobility Bed Mobility Overal bed mobility: Needs Assistance Bed Mobility: Supine to Sit;Sit to Supine     Supine to sit: Min assist Sit to supine: Min guard   General bed mobility comments: assistance with trunk    Transfers Overall transfer level: Needs assistance Equipment used: Rolling walker (2 wheels) Transfers: Sit to/from Stand Sit to Stand: Min assist           General transfer comment: required assistance with walker to keep close     Balance Overall balance assessment: Needs assistance Sitting-balance support: Feet supported Sitting balance-Leahy Scale: Fair Sitting balance - Comments: able to sit on EOB  without assistance   Standing balance support: Bilateral upper extremity supported Standing balance-Leahy Scale: Poor Standing balance comment: reliant on RW while standing                           ADL either performed or assessed with clinical judgement   ADL Overall ADL's : Needs assistance/impaired     Grooming: Set up;Sitting Grooming Details (indicate cue type and reason): performed seated at sink Upper Body Bathing: Set up;Sitting Upper Body Bathing Details (indicate cue type and reason): performed seated at sink Lower Body Bathing: Minimal assistance;Sitting/lateral leans;Sit to/from stand Lower Body Bathing Details (indicate cue type and reason): performed at sink with assistance for balance while standing Upper Body Dressing : Minimal assistance Upper Body Dressing Details (indicate cue type and reason): donned gown                 Functional mobility during ADLs: Minimal assistance;Rolling walker (2 wheels) General ADL Comments: required assistance with walker due to unsafe practices; pushes walker too far ahead    Extremity/Trunk Assessment              Vision       Perception     Praxis      Cognition Arousal/Alertness: Awake/alert Behavior During Therapy: Impulsive Overall Cognitive Status: No family/caregiver present to determine baseline cognitive functioning                                 General Comments: required cues for safety due to impulsiveness  Exercises     Shoulder Instructions       General Comments      Pertinent Vitals/ Pain       Pain Assessment: No/denies pain  Home Living                                          Prior Functioning/Environment              Frequency  Min 2X/week        Progress Toward Goals  OT Goals(current goals can now be found in the care plan section)  Progress towards OT goals: Progressing toward goals  Acute Rehab OT  Goals Patient Stated Goal: walk more OT Goal Formulation: With patient Time For Goal Achievement: 12/20/21 Potential to Achieve Goals: Good ADL Goals Pt Will Perform Lower Body Bathing: with supervision;sit to/from stand Pt Will Perform Lower Body Dressing: with supervision;sit to/from stand Pt Will Transfer to Toilet: with supervision;ambulating;bedside commode;stand pivot transfer Pt Will Perform Toileting - Clothing Manipulation and hygiene: with supervision Additional ADL Goal #1: Pt will verbalize 3 strategies to reduce risk of falls  Plan Discharge plan remains appropriate    Co-evaluation                 AM-PAC OT "6 Clicks" Daily Activity     Outcome Measure   Help from another person eating meals?: None Help from another person taking care of personal grooming?: A Little Help from another person toileting, which includes using toliet, bedpan, or urinal?: A Lot Help from another person bathing (including washing, rinsing, drying)?: A Lot Help from another person to put on and taking off regular upper body clothing?: A Little Help from another person to put on and taking off regular lower body clothing?: A Lot 6 Click Score: 16    End of Session Equipment Utilized During Treatment: Rolling walker (2 wheels)  OT Visit Diagnosis: Unsteadiness on feet (R26.81);Other abnormalities of gait and mobility (R26.89);Muscle weakness (generalized) (M62.81);History of falling (Z91.81);Other symptoms and signs involving cognitive function   Activity Tolerance Patient tolerated treatment well   Patient Left in bed;with call bell/phone within reach;with bed alarm set   Nurse Communication Mobility status        Time: 9892-1194 OT Time Calculation (min): 23 min  Charges: OT General Charges $OT Visit: 1 Visit OT Treatments $Self Care/Home Management : 23-37 mins  Alfonse Flavors, OTA Acute Rehabilitation Services  Pager [phone] Office [phone]   Dewain Penning 12/13/2021, 11:43 AM

## 2021-12-13 NOTE — TOC Transition Note (Addendum)
Transition of Care [facility]) - CM/SW Discharge Note   Patient Details  Name: Johnny Dunlap MRN: [id number] Date of Birth: Jul 02, 1950  Transition of [facility]) CM/SW Contact:  Mearl Latin, LCSW Phone Number: 12/13/2021, 3:45 PM   Clinical Narrative:    Patient will DC to: [place] rehab Anticipated DC date: 12/13/21 Family notified: none available Transport by: Sharin Mons   Per MD patient ready for DC to [place]. RN to call report prior to discharge [phone]). RN, patient, patient's family, and facility notified of DC. Discharge Summary and FL2 sent to facility. CSW made APS report regarding patient's concern that someone is stealing his check. DC packet on chart. Ambulance transport requested for patient.   CSW will sign off for now as social work intervention is no longer needed. Please consult [place] again if new needs arise.   12/15: Update: APS is not accepting the case.      Final next level of care: Skilled Nursing Facility Barriers to Discharge: No Barriers Identified   Patient Goals and CMS Choice Patient states their goals for this hospitalization and ongoing recovery are:: Feel better CMS Medicare.gov Compare Post Acute Care list provided to:: Patient Choice offered to / list presented to : Patient  Discharge Placement   Existing PASRR number confirmed : 12/13/21          Patient chooses bed at: [facility] Patient to be transferred to facility by: [facility] Name of family member notified: None Patient and family notified of of transfer: 12/13/21  Discharge Plan and Services In-house Referral: Clinical Social Work   Post Acute Care Choice: Skilled Nursing Facility                               Social Determinants of Health (SDOH) Interventions     Readmission Risk Interventions No flowsheet data found.

## 2021-12-14 LAB — GLUCOSE, CAPILLARY: Glucose-Capillary: 152 mg/dL — ABNORMAL HIGH (ref 70–99)

## 2021-12-14 NOTE — Progress Notes (Signed)
[facility] has arrived to transport pt via stretcher to [facility]. Pt alert and oriented x4 in no acute distress upon discharge. Pt has taken all of his belongings with him.

## 2021-12-14 NOTE — Progress Notes (Signed)
[facility] in [place] at [phone] to give report x3 but no answer. Voicemail never picked up.

## 2021-12-14 NOTE — Progress Notes (Addendum)
HOSPITAL MEDICINE OVERNIGHT EVENT NOTE    First available transport for discharge patient skilled nursing facility was at approximately 2100 on 12/14.    Unfortunately, patient is receiving facility stated that they would not accept him at that late hour.  Discharge unfortunately could not be completed.  Transfer/discharge rescheduled for tomorrow morning on 12/15 at 0830.  Marinda Elk  MD [facility]

## 2022-01-05 ENCOUNTER — Emergency Department (HOSPITAL_COMMUNITY): Payer: Medicare (Managed Care)

## 2022-01-05 ENCOUNTER — Emergency Department (HOSPITAL_COMMUNITY)
Admission: EM | Admit: 2022-01-05 | Discharge: 2022-01-05 | Disposition: A | Discharge status: Home / Self Care | Payer: Medicare (Managed Care) | Attending: Emergency Medicine | Admitting: Emergency Medicine

## 2022-01-05 ENCOUNTER — Other Ambulatory Visit: Payer: Self-pay

## 2022-01-05 ENCOUNTER — Encounter (HOSPITAL_COMMUNITY): Payer: Self-pay

## 2022-01-05 DIAGNOSIS — R109 Unspecified abdominal pain: Secondary | ICD-10-CM | POA: Insufficient documentation

## 2022-01-05 DIAGNOSIS — R350 Frequency of micturition: Secondary | ICD-10-CM | POA: Insufficient documentation

## 2022-01-05 DIAGNOSIS — K7469 Other cirrhosis of liver: Secondary | ICD-10-CM

## 2022-01-05 DIAGNOSIS — N3001 Acute cystitis with hematuria: Secondary | ICD-10-CM

## 2022-01-05 LAB — CBC WITH DIFFERENTIAL/PLATELET
Abs Immature Granulocytes: 0.01 10*3/uL (ref 0.00–0.07)
Basophils Absolute: 0 10*3/uL (ref 0.0–0.1)
Basophils Relative: 1 %
Eosinophils Absolute: 0.3 10*3/uL (ref 0.0–0.5)
Eosinophils Relative: 9 %
HCT: 29 % — ABNORMAL LOW (ref 39.0–52.0)
Hemoglobin: 9.9 g/dL — ABNORMAL LOW (ref 13.0–17.0)
Immature Granulocytes: 0 %
Lymphocytes Relative: 20 %
Lymphs Abs: 0.6 10*3/uL — ABNORMAL LOW (ref 0.7–4.0)
MCH: 34.7 pg — ABNORMAL HIGH (ref 26.0–34.0)
MCHC: 34.1 g/dL (ref 30.0–36.0)
MCV: 101.8 fL — ABNORMAL HIGH (ref 80.0–100.0)
Monocytes Absolute: 0.4 10*3/uL (ref 0.1–1.0)
Monocytes Relative: 14 %
Neutro Abs: 1.7 10*3/uL (ref 1.7–7.7)
Neutrophils Relative %: 56 %
Platelets: 40 10*3/uL — ABNORMAL LOW (ref 150–400)
RBC: 2.85 MIL/uL — ABNORMAL LOW (ref 4.22–5.81)
RDW: 17.2 % — ABNORMAL HIGH (ref 11.5–15.5)
WBC: 3 10*3/uL — ABNORMAL LOW (ref 4.0–10.5)
nRBC: 0 % (ref 0.0–0.2)

## 2022-01-05 LAB — URINALYSIS, ROUTINE W REFLEX MICROSCOPIC
Bilirubin Urine: NEGATIVE
Glucose, UA: NEGATIVE mg/dL
Ketones, ur: NEGATIVE mg/dL
Nitrite: POSITIVE — AB
Protein, ur: 30 mg/dL — AB
Specific Gravity, Urine: 1.005 (ref 1.005–1.030)
WBC, UA: >50 WBC/hpf — ABNORMAL HIGH (ref 0–5)
pH: 7 (ref 5.0–8.0)

## 2022-01-05 LAB — COMPREHENSIVE METABOLIC PANEL
ALT: 23 U/L (ref 0–44)
AST: 37 U/L (ref 15–41)
Albumin: 2.7 g/dL — ABNORMAL LOW (ref 3.5–5.0)
Alkaline Phosphatase: 127 U/L — ABNORMAL HIGH (ref 38–126)
Anion gap: 7 (ref 5–15)
BUN: 16 mg/dL (ref 8–23)
CO2: 23 mmol/L (ref 22–32)
Calcium: 8.7 mg/dL — ABNORMAL LOW (ref 8.9–10.3)
Chloride: 113 mmol/L — ABNORMAL HIGH (ref 98–111)
Creatinine, Ser: 0.92 mg/dL (ref 0.61–1.24)
GFR, Estimated: >60 mL/min (ref >60)
Glucose, Bld: 97 mg/dL (ref 70–99)
Potassium: 3.3 mmol/L — ABNORMAL LOW (ref 3.5–5.1)
Sodium: 143 mmol/L (ref 135–145)
Total Bilirubin: 4.6 mg/dL — ABNORMAL HIGH (ref 0.3–1.2)
Total Protein: 5.7 g/dL — ABNORMAL LOW (ref 6.5–8.1)

## 2022-01-05 MED ORDER — SODIUM CHLORIDE 0.9 % IV SOLN
2.0000 g | Freq: Once | INTRAVENOUS | Status: AC
  Administered 2022-01-05: 2 g via INTRAVENOUS
  Filled 2022-01-05: qty 20

## 2022-01-05 MED ORDER — IOHEXOL 300 MG/ML  SOLN
100.0000 mL | Freq: Once | INTRAMUSCULAR | Status: AC | PRN
  Administered 2022-01-05: 100 mL via INTRAVENOUS

## 2022-01-05 MED ORDER — CEPHALEXIN 500 MG PO CAPS: 500.0000 mg | ORAL_CAPSULE | Freq: Four times a day (QID) | ORAL | 0 refills | Status: AC

## 2022-01-05 NOTE — ED Notes (Signed)
[facility] called

## 2022-01-05 NOTE — ED Notes (Signed)
Pt given meal tray.

## 2022-01-05 NOTE — ED Notes (Signed)
Pt refused to allow nurse to perform an EKG

## 2022-01-05 NOTE — ED Notes (Signed)
Patient came out of his room and up to the desk to ask for blankets.  Re-directed patient back to room.  Placed patient back into bed with blankets and advised him he could not be out walking around.  Showed patient how to use call bell and to work TV

## 2022-01-05 NOTE — ED Notes (Signed)
Advised patient we needed urine specimen.  Urinal left at bedside.  Patient stated he could not urinate now, but would have to later.  Patient given 2 more warm blankets. Patient resting comfortably.

## 2022-01-05 NOTE — ED Notes (Signed)
Attempted to call report to nurse at [facility] X 2.

## 2022-01-05 NOTE — ED Notes (Signed)
Attempted to call report to the [facility], unable to get in touch with an employee.

## 2022-01-05 NOTE — ED Triage Notes (Signed)
Pt brought in by [facility] from [place] [facility]. Ems stated staff told them pt has had dark urine for a few days.

## 2022-01-05 NOTE — ED Notes (Signed)
Pt was up out of bed and walking down the hallway with no assistance, pt had a normal gait. Placed back into bed in his room. Given blankets and call bell in reach.

## 2022-01-05 NOTE — ED Provider Notes (Signed)
[facility] EMERGENCY DEPARTMENT Provider Note   CSN: [id number] Arrival date & time: 01/05/22  1436     History  Chief Complaint  Patient presents with   Urinary Frequency    Johnny Dunlap is a 72 y.o. male.  Patient was sent over to the emergency department for dark-colored urine.  He has a history of cirrhosis.  The history is provided by the patient and medical records. No language interpreter was used.  Urinary Frequency This is a new problem. The problem occurs constantly. The problem has not changed since onset.Pertinent negatives include no chest pain, no abdominal pain and no headaches. Nothing aggravates the symptoms. Nothing relieves the symptoms. The treatment provided no relief.      Home Medications Prior to Admission medications   Medication Sig Start Date End Date Taking? Authorizing Provider  acetaminophen (TYLENOL) 325 MG tablet Take 650 mg by mouth every 6 (six) hours as needed.   Yes [provider]  fludrocortisone (FLORINEF) 0.1 MG tablet Take 1 tablet (0.1 mg total) by mouth daily. 12/14/21  Yes Swayze, Ava, DO  folic acid (FOLVITE) 1 MG tablet Take 1 tablet (1 mg total) by mouth daily. 09/22/21  Yes Rai, Ripudeep K, MD  gabapentin (NEURONTIN) 400 MG capsule Take 400 mg by mouth 3 (three) times daily.   Yes [provider]  lactulose (CHRONULAC) 10 GM/15ML solution Take 45 mLs (30 g total) by mouth 3 (three) times daily. 09/21/21  Yes Rai, Ripudeep K, MD  midodrine (PROAMATINE) 5 MG tablet Take 5 mg by mouth 3 (three) times daily with meals. Hold if SBP >130   Yes [provider]  Multiple Vitamin (MULTIVITAMIN WITH MINERALS) TABS tablet Take 1 tablet by mouth daily. 08/09/21  Yes Ghimire, Henreitta Leber, MD  tamsulosin (FLOMAX) 0.4 MG CAPS capsule Take 1 capsule (0.4 mg total) by mouth daily. 09/21/21  Yes Rai, Ripudeep K, MD  thiamine 100 MG tablet Take 1 tablet (100 mg total) by mouth daily. 09/22/21  Yes Rai, Ripudeep K, MD  Vitamin D,  Ergocalciferol, (DRISDOL) 1.25 MG (50000 UNIT) CAPS capsule Take 50,000 Units by mouth every 7 (seven) days. Mondays   Yes [provider]  feeding supplement (ENSURE ENLIVE / ENSURE PLUS) LIQD Take 237 mLs by mouth 2 (two) times daily between meals. 08/11/21   Ghimire, Henreitta Leber, MD  furosemide (LASIX) 40 MG tablet Take 1 tablet (40 mg total) by mouth daily as needed for fluid or edema. Patient not taking: Reported on 01/05/2022 08/11/21   Jonetta Osgood, MD      Allergies    Patient has no known allergies.    Review of Systems   Review of Systems  Constitutional:  Negative for appetite change and fatigue.  HENT:  Negative for congestion, ear discharge and sinus pressure.   Eyes:  Negative for discharge.  Respiratory:  Negative for cough.   Cardiovascular:  Negative for chest pain.  Gastrointestinal:  Negative for abdominal pain and diarrhea.  Genitourinary:  Positive for frequency. Negative for hematuria.  Musculoskeletal:  Negative for back pain.  Skin:  Negative for rash.  Neurological:  Negative for seizures and headaches.  Psychiatric/Behavioral:  Negative for hallucinations.    Physical Exam Updated Vital Signs BP 130/77    Pulse 65    Temp 97.7 F (36.5 C) (Oral)    Resp 20    Ht 6' (1.829 m)    Wt 67.1 kg    SpO2 98%    BMI 20.07  kg/m  Physical Exam Vitals and nursing note reviewed.  Constitutional:      Appearance: He is well-developed.  HENT:     Head: Normocephalic.     Nose: Nose normal.  Eyes:     General: No scleral icterus.    Conjunctiva/sclera: Conjunctivae normal.  Neck:     Thyroid: No thyromegaly.  Cardiovascular:     Rate and Rhythm: Normal rate and regular rhythm.     Heart sounds: No murmur heard.   No friction rub. No gallop.  Pulmonary:     Breath sounds: No stridor. No wheezing or rales.  Chest:     Chest wall: No tenderness.  Abdominal:     General: There is no distension.     Tenderness: There is no abdominal tenderness. There is  no rebound.  Musculoskeletal:        General: Normal range of motion.     Cervical back: Neck supple.  Lymphadenopathy:     Cervical: No cervical adenopathy.  Skin:    Findings: No erythema or rash.  Neurological:     Mental Status: He is oriented to person, place, and time.     Motor: No abnormal muscle tone.     Coordination: Coordination normal.  Psychiatric:        Behavior: Behavior normal.    ED Results / Procedures / Treatments   Labs (all labs ordered are listed, but only abnormal results are displayed) Labs Reviewed  CBC WITH DIFFERENTIAL/PLATELET - Abnormal; Notable for the following components:      Result Value   WBC 3.0 (*)    RBC 2.85 (*)    Hemoglobin 9.9 (*)    HCT 29.0 (*)    MCV 101.8 (*)    MCH 34.7 (*)    RDW 17.2 (*)    Platelets 40 (*)    Lymphs Abs 0.6 (*)    All other components within normal limits  COMPREHENSIVE METABOLIC PANEL - Abnormal; Notable for the following components:   Potassium 3.3 (*)    Chloride 113 (*)    Calcium 8.7 (*)    Total Protein 5.7 (*)    Albumin 2.7 (*)    Alkaline Phosphatase 127 (*)    Total Bilirubin 4.6 (*)    All other components within normal limits  URINALYSIS, ROUTINE W REFLEX MICROSCOPIC - Abnormal; Notable for the following components:   Color, Urine AMBER (*)    APPearance CLOUDY (*)    Hgb urine dipstick LARGE (*)    Protein, ur 30 (*)    Nitrite POSITIVE (*)    Leukocytes,Ua LARGE (*)    WBC, UA >50 (*)    Bacteria, UA MANY (*)    All other components within normal limits  URINE CULTURE    EKG None  Radiology CT ABDOMEN PELVIS W CONTRAST  Result Date: 01/05/2022 CLINICAL DATA:  Abdominal pain, acute, nonlocalized. dark urine for a few days, general abd pain. Hx of cirrhosis. Pt unable to elaborate on pain or symptoms EXAM: CT ABDOMEN AND PELVIS WITH CONTRAST TECHNIQUE: Multidetector CT imaging of the abdomen and pelvis was performed using the standard protocol following bolus administration of  intravenous contrast. CONTRAST:  15mL OMNIPAQUE IOHEXOL 300 MG/ML  SOLN COMPARISON:  None. FINDINGS: Lower chest: Trace to small volume right pleural effusion. Hepatobiliary: Nodular hepatic contour. No focal liver abnormality. Calcified gallstones within the gallbladder lumen. No gallbladder wall thickening or pericholecystic fluid. No biliary dilatation. Pancreas: No focal lesion. Normal pancreatic contour. No  surrounding inflammatory changes. No main pancreatic ductal dilatation. Spleen: The spleen is enlarged measuring up to 15 cm. No splenic lesion noted. Adrenals/Urinary Tract: No adrenal nodule bilaterally. Bilateral kidneys enhance symmetrically. Couple right calcified stones measuring up to 3 mm. No hydronephrosis. No hydroureter. Urinary bladder wall thickening.  Urinary bladder diverticula. On delayed imaging, there is no urothelial wall thickening and there are no filling defects in the opacified portions of the bilateral collecting systems or ureters. Stomach/Bowel: Stomach is within normal limits. No evidence of small bowel wall thickening or dilatation. Mild bowel wall thickening of the ascending colon. Right paracolic fat stranding. Appendix appears normal. Vascular/Lymphatic: Recanalized paraumbilical vein. Paraesophageal varices. Left upper quadrant venous collaterals. The main portal, splenic, superior mesenteric veins are patent. No abdominal aorta or iliac aneurysm. Severe atherosclerotic plaque of the aorta and its branches. No abdominal, pelvic, or inguinal lymphadenopathy. Reproductive: Prostate is unremarkable. Other: No intraperitoneal free fluid. No intraperitoneal free gas. No organized fluid collection. Musculoskeletal: No abdominal wall hernia or abnormality. No suspicious lytic or blastic osseous lesions. No acute displaced fracture. IMPRESSION: 1. Urinary bladder wall thickening. Correlate with urinalysis for infection. 2. Nonobstructive right nephrolithiasis measuring up to 3 mm. 3.  Cirrhosis with portal hypertension. Recommend nonemergent MRI liver protocol further evaluation. When the patient is clinically stable and able to follow directions and hold their breath (preferably as an outpatient) further evaluation with dedicated abdominal MRI should be considered. 4. 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 Differential diagnosis includes mild colitis. 5. Cholelithiasis no CT findings of acute cholecystitis. 6.  Aortic Atherosclerosis (ICD10-I70.0). Electronically Signed   By: Iven Finn M.D.   On: 01/05/2022 19:00    Procedures Procedures    Medications Ordered in ED Medications  cefTRIAXone (ROCEPHIN) 2 g in sodium chloride 0.9 % 100 mL IVPB (has no administration in time range)  iohexol (OMNIPAQUE) 300 MG/ML solution 100 mL (100 mLs Intravenous Contrast Given 01/05/22 1825)    ED Course/ Medical Decision Making/ A&P Patient with elevated bilirubin.  I spoke to the gastroenterologist and he recommended a CT of the chest.  No obstruction seen on CT.  Patient also has a urinary tract infection.                         Medical Decision Making  Patient with UTI and cirrhosis.  He will be started on Keflex and will follow up with GI for his cirrhosis This patient presents to the ED for concern of dark urine, this involves an extensive number of treatment options, and is a complaint that carries with it a high risk of complications and morbidity.  The differential diagnosis includes worsening cirrhosis, UTI   Co morbidities that complicate the patient evaluation  Cirrhosis   Additional history obtained:  Additional history obtained from nursing home record External records from outside source obtained and reviewed including hospital records   Lab Tests:  I Ordered, and personally interpreted labs.  The pertinent results include: Elevated bilirubin at 4.6, also urinalysis shows large leukocytes and many bacteria   Imaging Studies ordered:  I ordered imaging studies  including CT scan of the abdomen unremarkable I independently visualized and interpreted imaging which showed cirrhosis I agree with the radiologist interpretation   Cardiac Monitoring:  The patient was maintained on a cardiac monitor.  I personally viewed and interpreted the cardiac monitored which showed an underlying rhythm of: Normal sinus rhythm   Medicines ordered and prescription drug management:  I ordered medication  including Rocephin for UTI Reevaluation of the patient after these medicines showed that the patient stayed the same I have reviewed the patients home medicines and have made adjustments as needed   Test Considered:  MRI   Critical Interventions:  Antibiotics   Consultations Obtained:  I requested consultation with the GI,  and discussed lab and imaging findings as well as pertinent plan - they recommend: CT of the abdomen and then follow-up in the office   Problem List / ED Course:  Cirrhosis and UTI   Reevaluation:  After the interventions noted above, I reevaluated the patient and found that they have :stayed the same   Social Determinants of Health:  Nursing home patient   Dispostion:  After consideration of the diagnostic results and the patients response to treatment, I feel that the patent would benefit from discharge home and follow-up.  He will be given antibiotics to take for his UTI.         Final Clinical Impression(s) / ED Diagnoses Final diagnoses:  None    Rx / DC Orders ED Discharge Orders     None         Milton Ferguson, MD 01/07/22 1029

## 2022-01-05 NOTE — ED Notes (Signed)
Patient transported to CT 

## 2022-01-05 NOTE — Discharge Instructions (Signed)
Follow-up with your family doctor next week for your urinary tract infection.  Follow-up with the gastroenterologist Dr. Jenetta Downer next week for your cirrhosis

## 2022-01-05 NOTE — ED Notes (Signed)
Pt ambulated to the restroom.

## 2022-01-07 LAB — URINE CULTURE

## 2023-09-02 IMAGING — CT CT HEAD W/O CM
3 series · 14 of 47 positions shown, 16 images · non-contrast
Comparison: 11/01/2021

CLINICAL DATA: Dizziness

EXAM:
CT HEAD WITHOUT CONTRAST
TECHNIQUE: Contiguous axial images were obtained from the base of the skull
through the vertex without intravenous contrast.

[Series 3: head 5.0 h30s · axial · 0.46mm/px · z∈[-102,+23]mm · 8 of 31 slices shown, 10 images]
[im 3/31  brain]
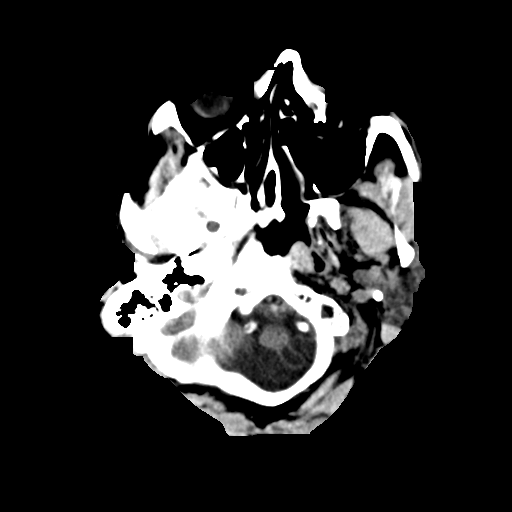
[im 3/31  bone]
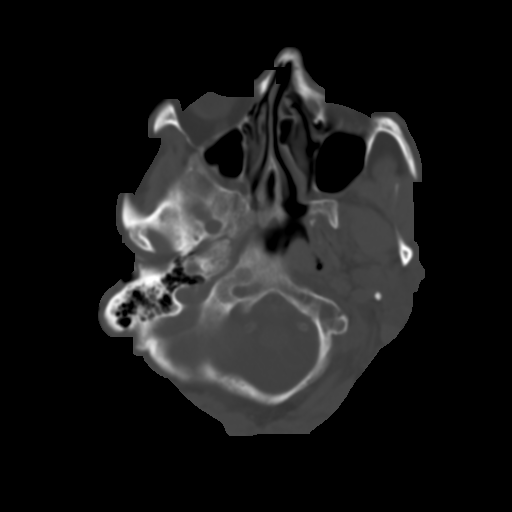
[im 7/31  brain]
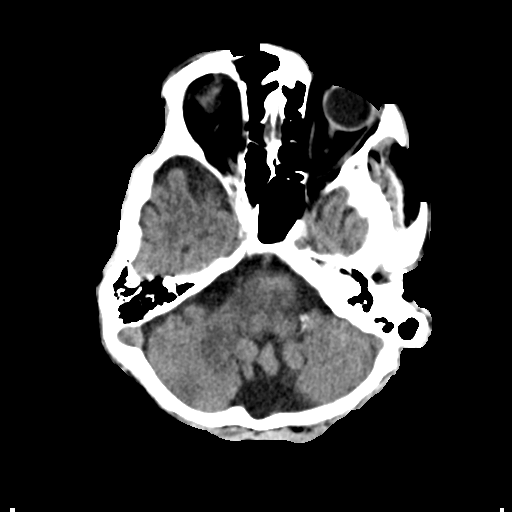
[im 10/31  brain]
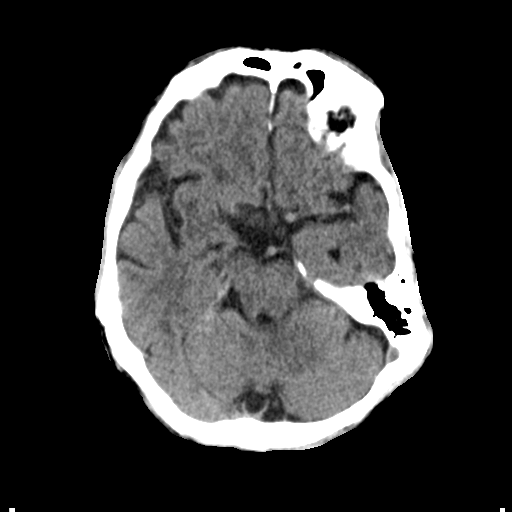
[im 14/31  brain]
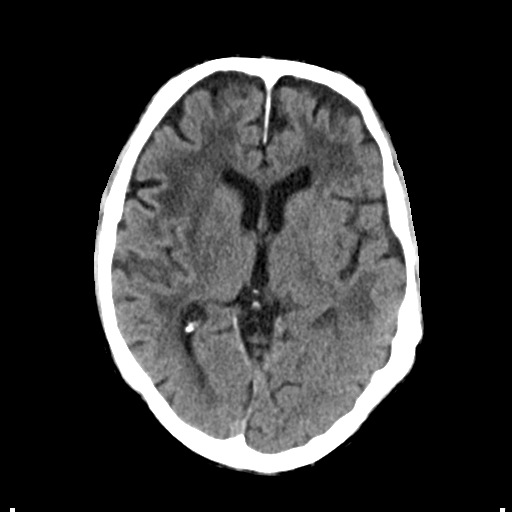
[im 17/31  brain]
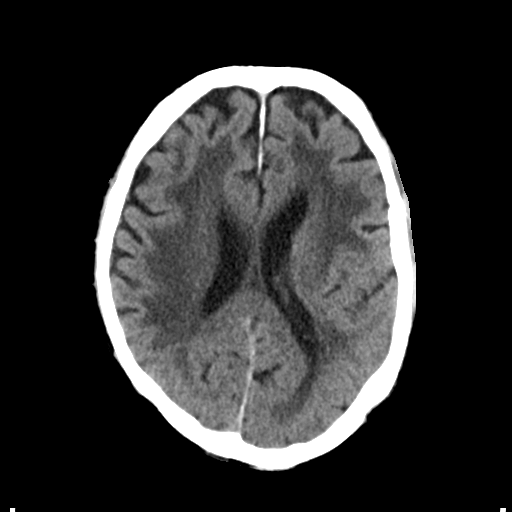
[im 17/31  bone]
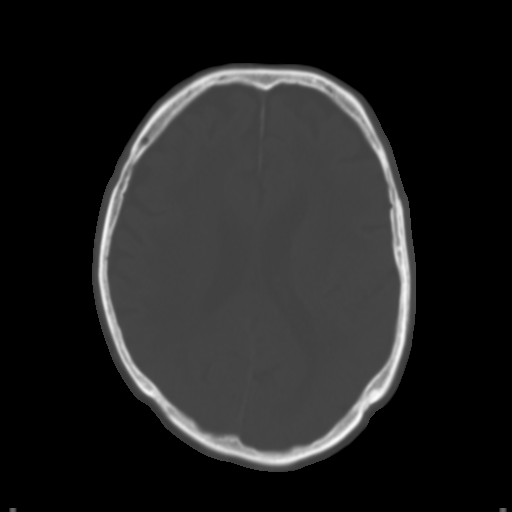
[im 21/31  brain]
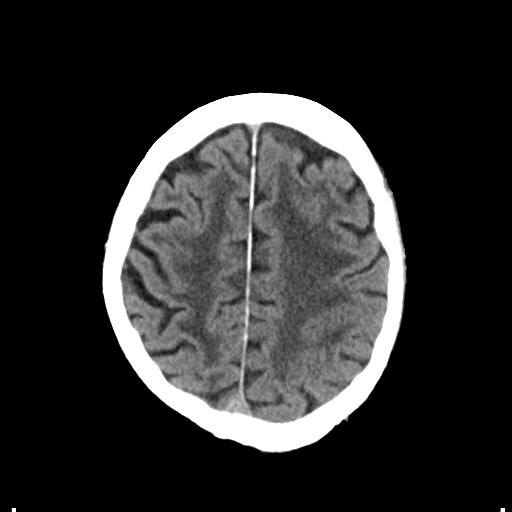
[im 24/31  brain]
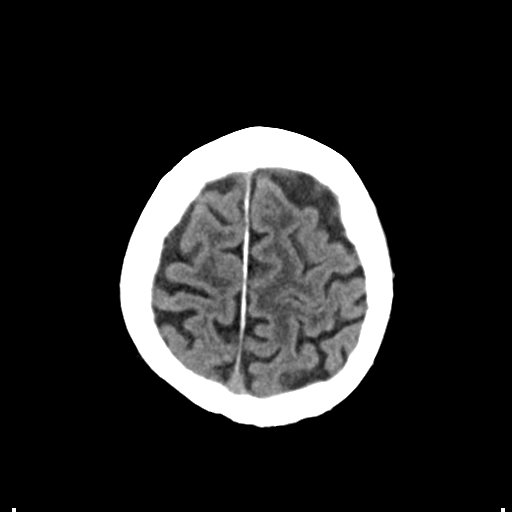
[im 28/31  brain]
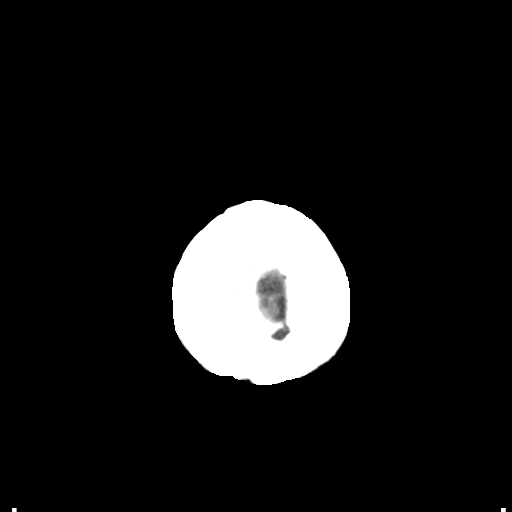

[Series 5: head 3.0 mpr cor · coronal · 0.30mm/px · 3 of 75 slices shown]
[im 25/75  brain]
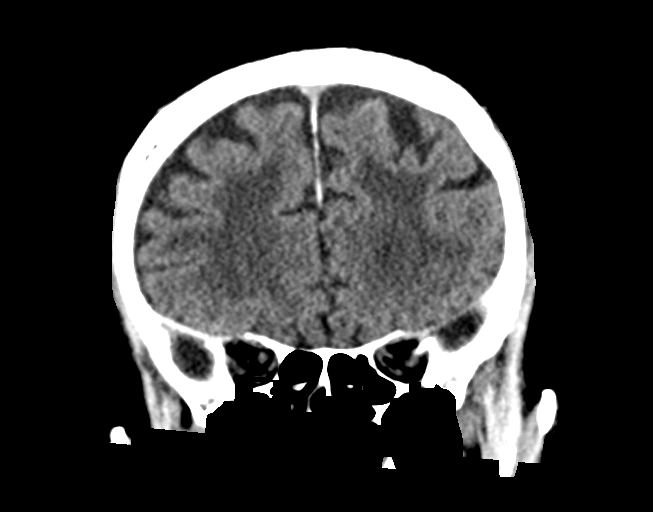
[im 33/75  brain]
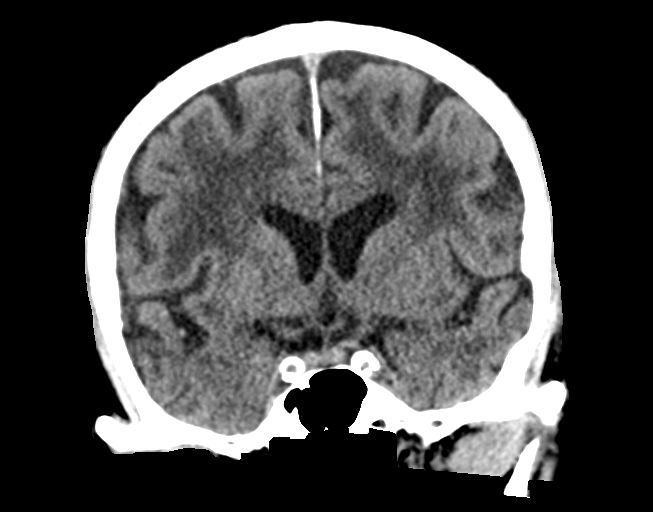
[im 42/75  brain]
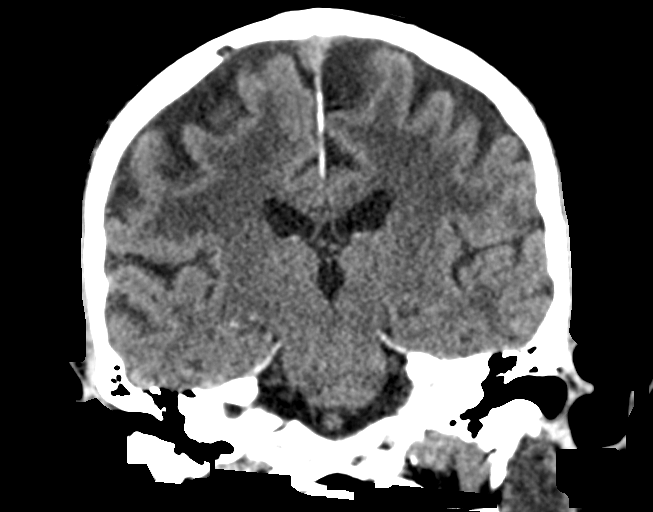

[Series 6: head 3.0 mpr sag · sagittal · 0.30mm/px · 3 of 67 slices shown]
[im 24/67  brain]
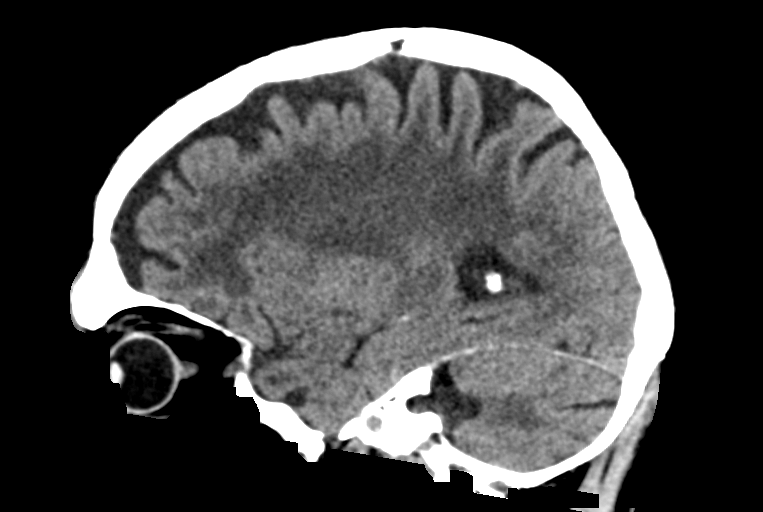
[im 34/67  brain]
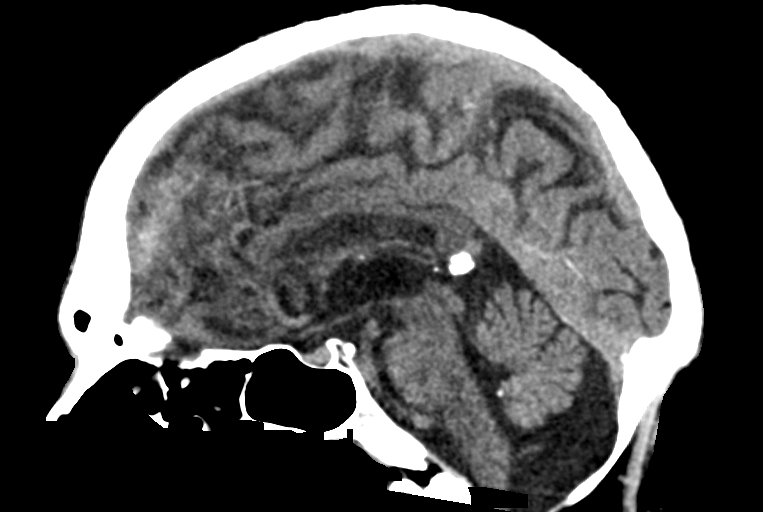
[im 43/67  brain]
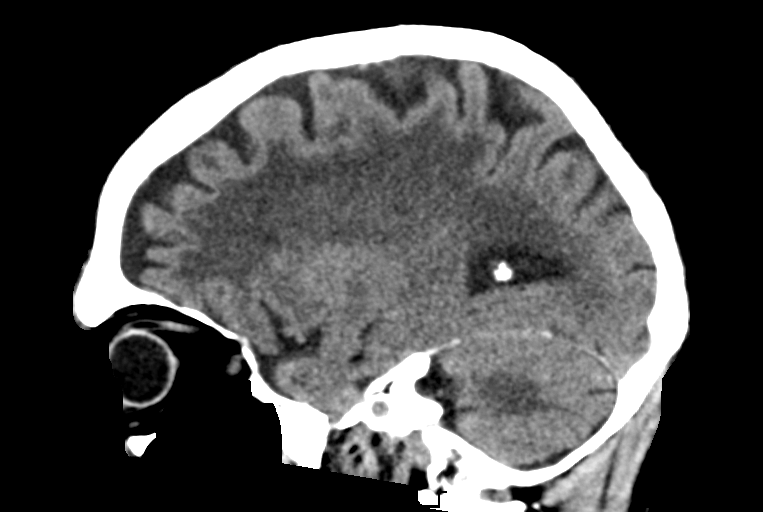

[14 of 47 positions shown; findings below may reference images not displayed]

FINDINGS: Brain: No evidence of acute infarction, hemorrhage, hydrocephalus,
extra-axial collection or mass lesion/mass effect. Extensive
low-density changes within the periventricular and subcortical white
matter compatible with chronic microvascular ischemic change. Mild
diffuse cerebral volume loss.

Vascular: Atherosclerotic calcifications involving the large vessels
of the skull base. No unexpected hyperdense vessel.

Skull: Normal. Negative for fracture or focal lesion.

Sinuses/Orbits: Similar mucosal thickening within the bilateral
ethmoid air cells. No acute findings.

Other: None.
IMPRESSION: 1. No acute intracranial findings.
2. Stable advanced chronic microvascular ischemic change.

## 2023-10-03 IMAGING — CT CT ABD-PELV W/ CM
2 of 5 series · 15 of 46 positions shown, 17 images · IV contrast (Omnipaque or Isovue)
Comparison: None.

CLINICAL DATA: Abdominal pain, acute, nonlocalized. dark urine for
a few days, general abd pain. Hx of cirrhosis. Pt unable to
elaborate on pain or symptoms

EXAM:
CT ABDOMEN AND PELVIS WITH CONTRAST
TECHNIQUE: Multidetector CT imaging of the abdomen and pelvis was performed
using the standard protocol following bolus administration of
intravenous contrast.
CONTRAST:  100mL OMNIPAQUE IOHEXOL 300 MG/ML  SOLN

[Series 2: axial st · axial · 0.85mm/px · z∈[+865,+1330]mm · 12 of 107 slices shown, 14 images]
[im 7/107  soft-tissue]
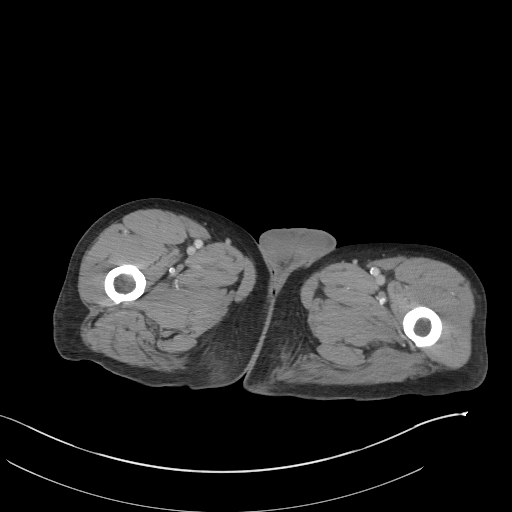
[im 7/107  bone]
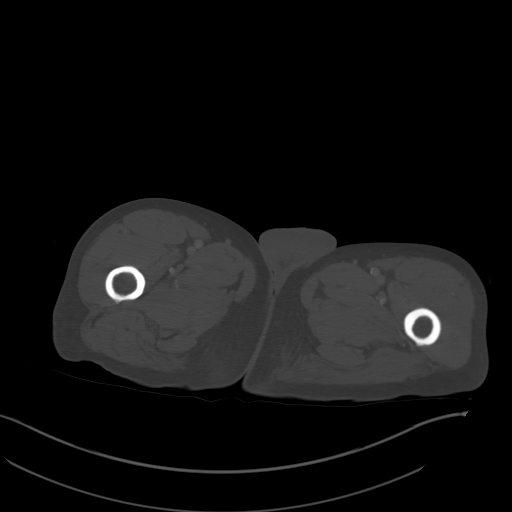
[im 19/107  soft-tissue]
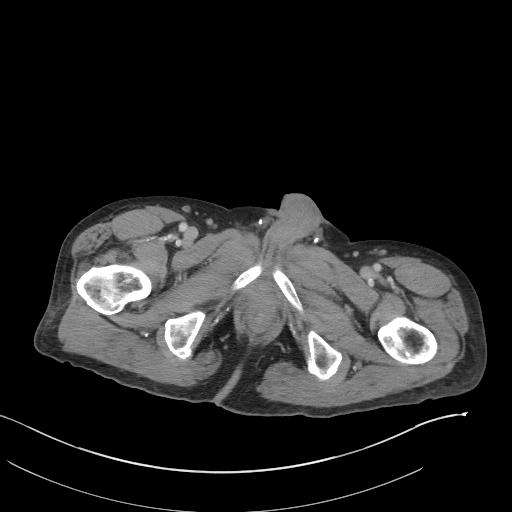
[im 25/107  soft-tissue]
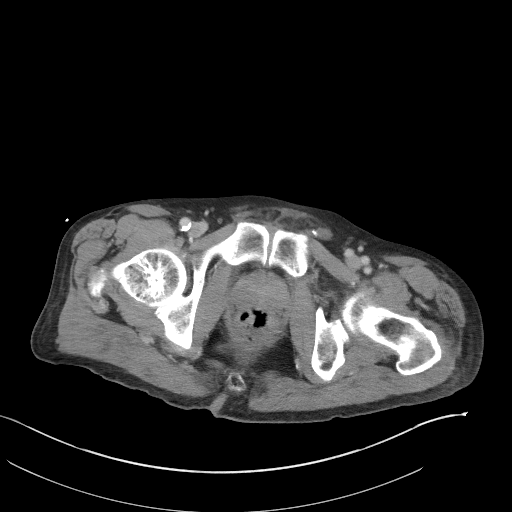
[im 32/107  soft-tissue]
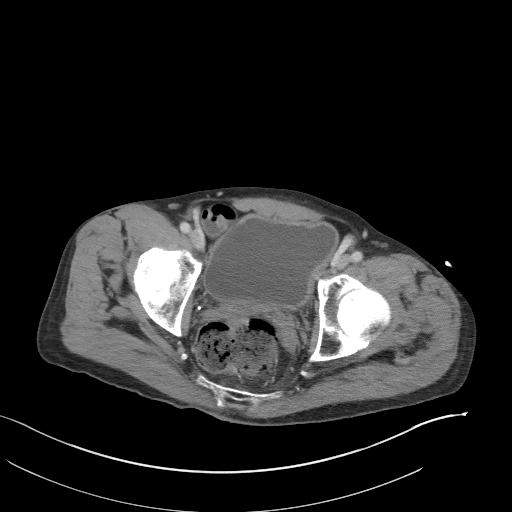
[im 44/107  soft-tissue]
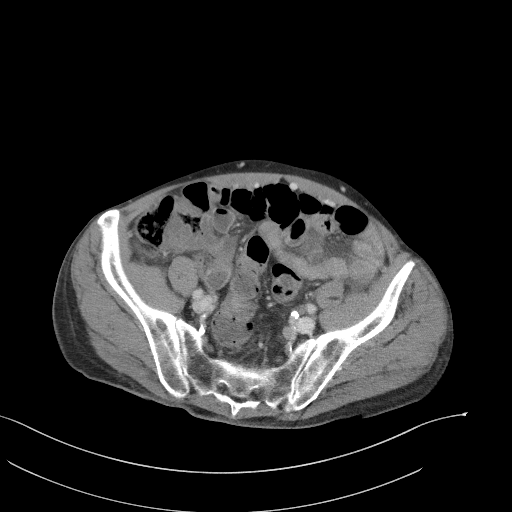
[im 50/107  soft-tissue]
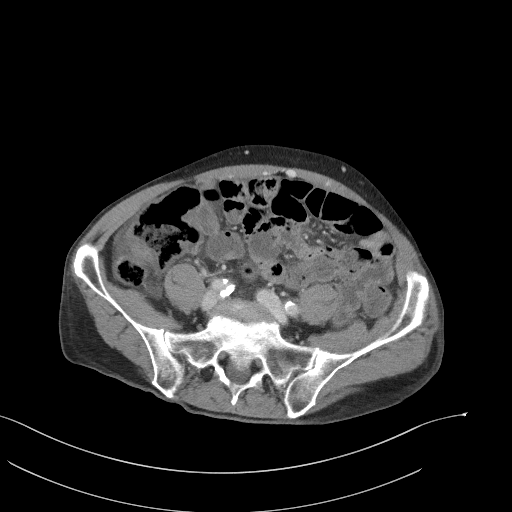
[im 57/107  soft-tissue]
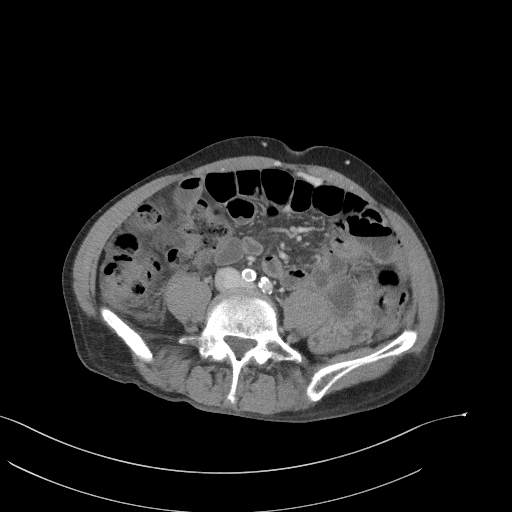
[im 69/107  soft-tissue]
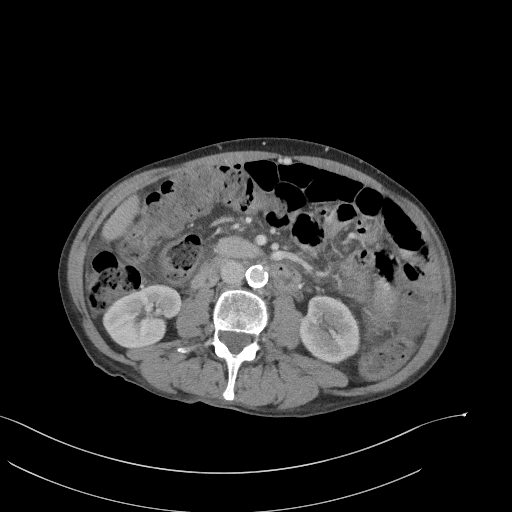
[im 75/107  soft-tissue]
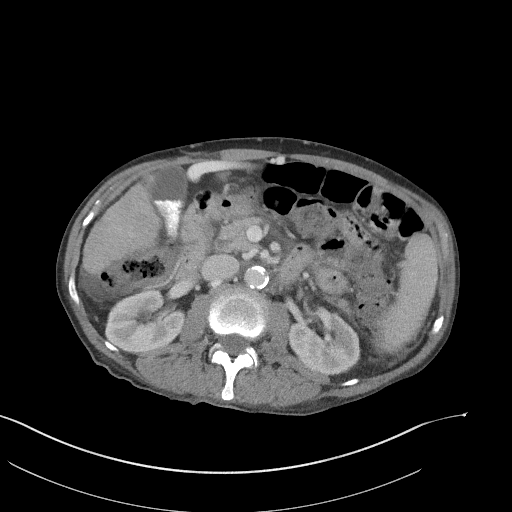
[im 75/107  bone]
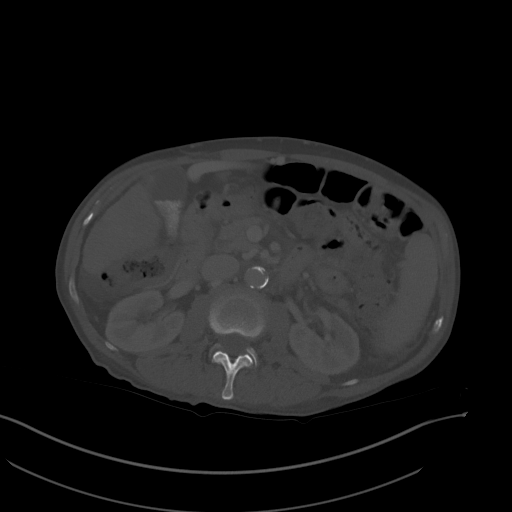
[im 82/107  soft-tissue]
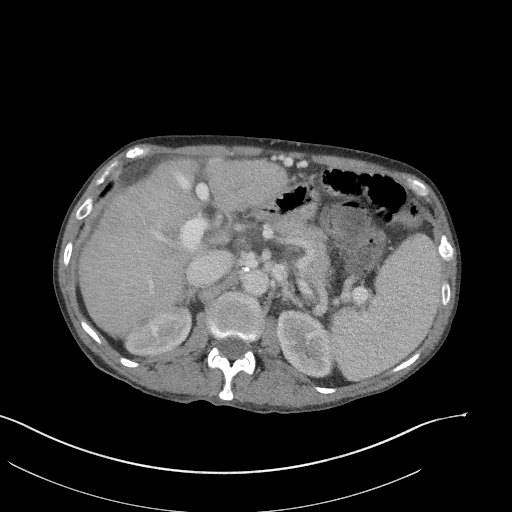
[im 94/107  soft-tissue]
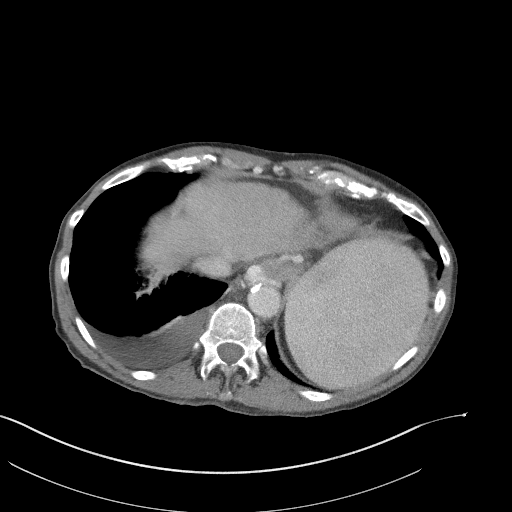
[im 100/107  soft-tissue]
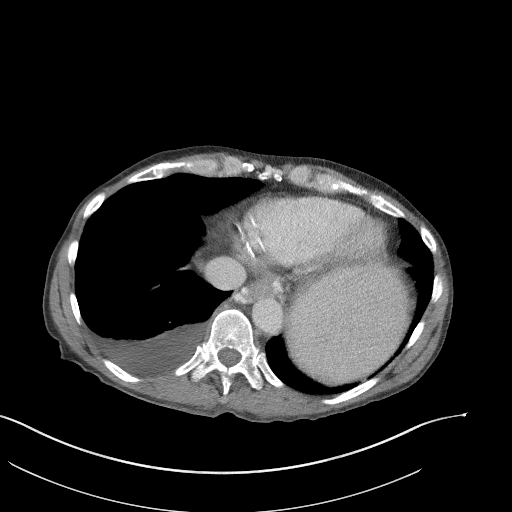

[Series 4: coronal st · coronal · 0.83mm/px · 3 of 98 slices shown]
[im 33/98  soft-tissue]
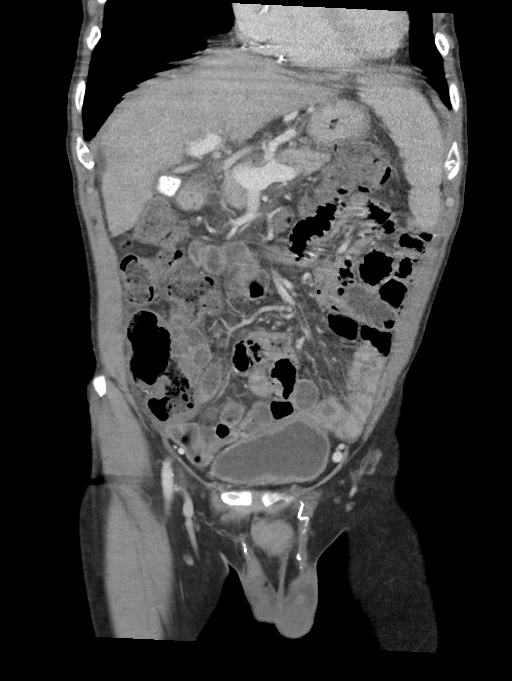
[im 44/98  soft-tissue]
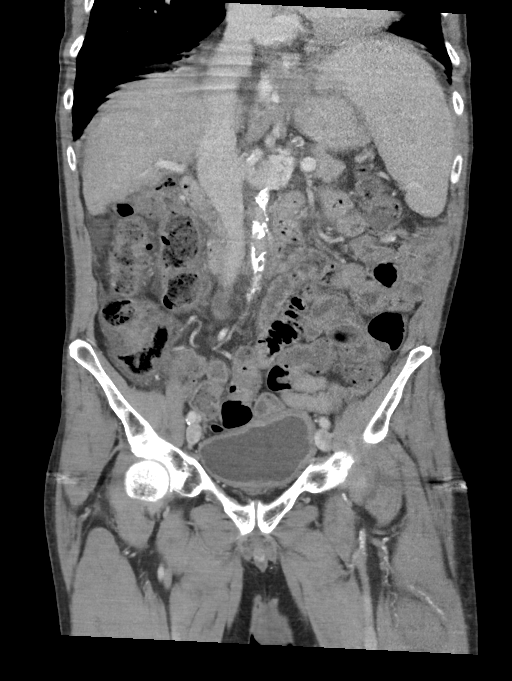
[im 54/98  soft-tissue]
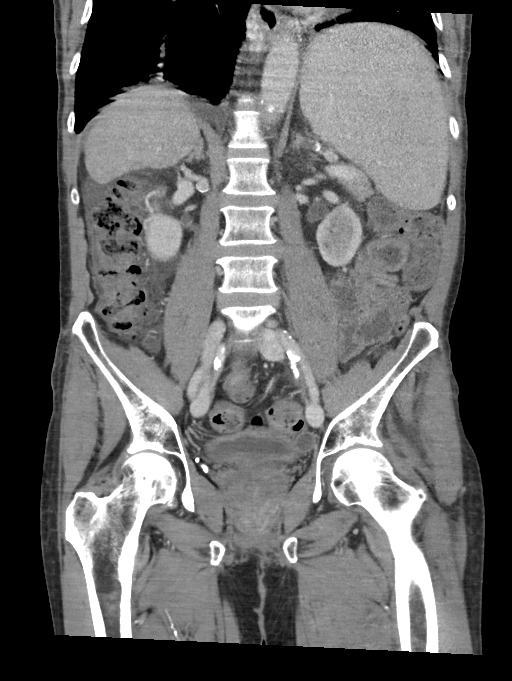

[15 of 46 positions shown; findings below may reference images not displayed]

FINDINGS: Lower chest: Trace to small volume right pleural effusion.

Hepatobiliary: Nodular hepatic contour. No focal liver abnormality.
Calcified gallstones within the gallbladder lumen. No gallbladder
wall thickening or pericholecystic fluid. No biliary dilatation.

Pancreas: No focal lesion. Normal pancreatic contour. No surrounding
inflammatory changes. No main pancreatic ductal dilatation.

Spleen: The spleen is enlarged measuring up to 15 cm. No splenic
lesion noted.

Adrenals/Urinary Tract:

No adrenal nodule bilaterally.

Bilateral kidneys enhance symmetrically. Couple right calcified
stones measuring up to 3 mm.

No hydronephrosis. No hydroureter.

Urinary bladder wall thickening.  Urinary bladder diverticula.

On delayed imaging, there is no urothelial wall thickening and there
are no filling defects in the opacified portions of the bilateral
collecting systems or ureters.

Stomach/Bowel: Stomach is within normal limits. No evidence of small
bowel wall thickening or dilatation. Mild bowel wall thickening of
the ascending colon. Right paracolic fat stranding. Appendix appears
normal.

Vascular/Lymphatic: Recanalized paraumbilical vein. Paraesophageal
varices. Left upper quadrant venous collaterals. The main portal,
splenic, superior mesenteric veins are patent. No abdominal aorta or
iliac aneurysm. Severe atherosclerotic plaque of the aorta and its
branches. No abdominal, pelvic, or inguinal lymphadenopathy.

Reproductive: Prostate is unremarkable.

Other: No intraperitoneal free fluid. No intraperitoneal free gas.
No organized fluid collection.

Musculoskeletal:

No abdominal wall hernia or abnormality.

No suspicious lytic or blastic osseous lesions. No acute displaced
fracture.
IMPRESSION: 1. Urinary bladder wall thickening. Correlate with urinalysis for
infection.
2. Nonobstructive right nephrolithiasis measuring up to 3 mm.
3. Cirrhosis with portal hypertension. Recommend nonemergent MRI
liver protocol further evaluation. When the patient is clinically
stable and able to follow directions and hold their breath
(preferably as an outpatient) further evaluation with dedicated
abdominal MRI should be considered.
4. Likely portal colopathy. Differential diagnosis includes mild
colitis.
5. Cholelithiasis no CT findings of acute cholecystitis.
6.  Aortic Atherosclerosis ([id number]).
